# Patient Record
Sex: Male | Born: 1950 | Race: White | Hispanic: Yes | Smoking: Current every day smoker
Health system: Southern US, Community
[De-identification: ages and names within clinical notes are randomized; demographics above are authoritative.]

## PROBLEM LIST (undated history)

## (undated) DIAGNOSIS — E119 Type 2 diabetes mellitus without complications: Secondary | ICD-10-CM

## (undated) DIAGNOSIS — F209 Schizophrenia, unspecified: Secondary | ICD-10-CM

## (undated) DIAGNOSIS — Z720 Tobacco use: Secondary | ICD-10-CM

## (undated) DIAGNOSIS — M109 Gout, unspecified: Secondary | ICD-10-CM

## (undated) DIAGNOSIS — I10 Essential (primary) hypertension: Secondary | ICD-10-CM

## (undated) HISTORY — DX: Essential (primary) hypertension: I10

## (undated) HISTORY — DX: Gout, unspecified: M10.9

## (undated) HISTORY — PX: OTHER SURGICAL HISTORY: SHX169

---

## 1999-06-21 ENCOUNTER — Emergency Department (HOSPITAL_COMMUNITY): Admission: EM | Admit: 1999-06-21 | Discharge: 1999-06-21 | Payer: Self-pay | Admitting: Internal Medicine

## 1999-07-23 ENCOUNTER — Emergency Department (HOSPITAL_COMMUNITY): Admission: EM | Admit: 1999-07-23 | Discharge: 1999-07-23 | Payer: Self-pay | Admitting: Emergency Medicine

## 1999-10-01 ENCOUNTER — Emergency Department (HOSPITAL_COMMUNITY): Admission: EM | Admit: 1999-10-01 | Discharge: 1999-10-01 | Payer: Self-pay | Admitting: *Deleted

## 2000-09-30 ENCOUNTER — Emergency Department (HOSPITAL_COMMUNITY): Admission: EM | Admit: 2000-09-30 | Discharge: 2000-09-30 | Payer: Self-pay | Admitting: Emergency Medicine

## 2003-02-25 ENCOUNTER — Ambulatory Visit (HOSPITAL_COMMUNITY): Admission: RE | Admit: 2003-02-25 | Discharge: 2003-02-25 | Payer: Self-pay | Admitting: Internal Medicine

## 2003-02-25 ENCOUNTER — Encounter: Payer: Self-pay | Admitting: Internal Medicine

## 2003-10-03 ENCOUNTER — Encounter: Admission: RE | Admit: 2003-10-03 | Discharge: 2003-10-03 | Payer: Self-pay | Admitting: Internal Medicine

## 2004-12-28 ENCOUNTER — Ambulatory Visit (HOSPITAL_COMMUNITY): Admission: RE | Admit: 2004-12-28 | Discharge: 2004-12-28 | Payer: Self-pay | Admitting: Gastroenterology

## 2013-09-21 ENCOUNTER — Ambulatory Visit (INDEPENDENT_AMBULATORY_CARE_PROVIDER_SITE_OTHER): Payer: Medicare Other | Admitting: Family Medicine

## 2013-09-21 VITALS — BP 132/88 | HR 86 | Temp 98.0°F | Resp 16 | Ht 65.0 in | Wt 134.0 lb

## 2013-09-21 DIAGNOSIS — M1A00X1 Idiopathic chronic gout, unspecified site, with tophus (tophi): Secondary | ICD-10-CM

## 2013-09-21 DIAGNOSIS — M1A9XX1 Chronic gout, unspecified, with tophus (tophi): Secondary | ICD-10-CM

## 2013-09-21 MED ORDER — PREDNISONE 20 MG PO TABS: ORAL_TABLET | ORAL | Status: DC

## 2013-09-21 NOTE — Progress Notes (Signed)
  Subjective:  This chart was scribed for Elvina Sidle, MD by Arlan Organ, ED Scribe. This patient was seen in room Room 10 and the patient's care was started 1:32 PM.    Patient ID: [name] Wise, male    DOB: 08/24/1951, 62 y.o.   MRN: [id number]  HPI HPI Comments: [name] Wise is a 62 y.o. Male with a hx of gout who presents to [facility] complaining of boils on his fingers oh his left hand that started a few weeks ago . Pt also reports associated swelling in his left hand. Pt denies any pain in his fingers. Pt currently takes a prescribed medication for his gout, and he states he is taking the medication every day. Pt is originally from [place], but has been here in the [place] for 30 years now.   Review of Systems  Skin: Positive for wound (boils).      Objective:   Physical Exam  Nursing note and vitals reviewed. Constitutional: He is oriented to person, place, and time. He appears well-developed and well-nourished.  HENT:  Head: Normocephalic and atraumatic.  Eyes: EOM are normal.  Neck: Normal range of motion.  Cardiovascular: Normal rate.   Pulmonary/Chest: Effort normal.  Musculoskeletal: Normal range of motion.  Neurological: He is alert and oriented to person, place, and time.  Skin: Skin is warm and dry.  Concretions in distal thumb and index finger  Psychiatric: He has a normal mood and affect. His behavior is normal.      Assessment & Plan:  Tophaceous gout - Plan: predniSONE (DELTASONE) 20 MG tablet, Ambulatory referral to Orthopedic Surgery, DISCONTINUED: predniSONE (DELTASONE) 20 MG tablet  Signed, Elvina Sidle, MD

## 2013-09-21 NOTE — Patient Instructions (Signed)
Gout  Gout is an inflammatory condition (arthritis) caused by a buildup of uric acid crystals in the joints. Uric acid is a chemical that is normally present in the blood. Under some circumstances, uric acid can form into crystals in your joints. This causes joint redness, soreness, and swelling (inflammation). Repeat attacks are common. Over time, uric acid crystals can form into masses (tophi) near a joint, causing disfigurement. Gout is treatable and often preventable.  CAUSES   The disease begins with elevated levels of uric acid in the blood. Uric acid is produced by your body when it breaks down a naturally found substance called purines. This also happens when you eat certain foods such as meats and fish. Causes of an elevated uric acid level include:   Being passed down from parent to child (heredity).   Diseases that cause increased uric acid production (obesity, psoriasis, some cancers).   Excessive alcohol use.   Diet, especially diets rich in meat and seafood.   Medicines, including certain cancer-fighting drugs (chemotherapy), diuretics, and aspirin.   Chronic kidney disease. The kidneys are no longer able to remove uric acid well.   Problems with metabolism.  Conditions strongly associated with gout include:   Obesity.   High blood pressure.   High cholesterol.   Diabetes.  Not everyone with elevated uric acid levels gets gout. It is not understood why some people get gout and others do not. Surgery, joint injury, and eating too much of certain foods are some of the factors that can lead to gout.  SYMPTOMS    An attack of gout comes on quickly. It causes intense pain with redness, swelling, and warmth in a joint.   Fever can occur.   Often, only one joint is involved. Certain joints are more commonly involved:   Base of the big toe.   Knee.   Ankle.   Wrist.   Finger.  Without treatment, an attack usually goes away in a few days to weeks. Between attacks, you usually will not have  symptoms, which is different from many other forms of arthritis.  DIAGNOSIS   Your caregiver will suspect gout based on your symptoms and exam. Removal of fluid from the joint (arthrocentesis) is done to check for uric acid crystals. Your caregiver will give you a medicine that numbs the area (local anesthetic) and use a needle to remove joint fluid for exam. Gout is confirmed when uric acid crystals are seen in joint fluid, using a special microscope. Sometimes, blood, urine, and X-ray tests are also used.  TREATMENT   There are 2 phases to gout treatment: treating the sudden onset (acute) attack and preventing attacks (prophylaxis).  Treatment of an Acute Attack   Medicines are used. These include anti-inflammatory medicines or steroid medicines.   An injection of steroid medicine into the affected joint is sometimes necessary.   The painful joint is rested. Movement can worsen the arthritis.   You may use warm or cold treatments on painful joints, depending which works best for you.   Discuss the use of coffee, vitamin C, or cherries with your caregiver. These may be helpful treatment options.  Treatment to Prevent Attacks  After the acute attack subsides, your caregiver may advise prophylactic medicine. These medicines either help your kidneys eliminate uric acid from your body or decrease your uric acid production. You may need to stay on these medicines for a very long time.  The early phase of treatment with prophylactic medicine can be associated   with an increase in acute gout attacks. For this reason, during the first few months of treatment, your caregiver may also advise you to take medicines usually used for acute gout treatment. Be sure you understand your caregiver's directions.  You should also discuss dietary treatment with your caregiver. Certain foods such as meats and fish can increase uric acid levels. Other foods such as dairy can decrease levels. Your caregiver can give you a list of foods  to avoid.  HOME CARE INSTRUCTIONS    Do not take aspirin to relieve pain. This raises uric acid levels.   Only take over-the-counter or prescription medicines for pain, discomfort, or fever as directed by your caregiver.   Rest the joint as much as possible. When in bed, keep sheets and blankets off painful areas.   Keep the affected joint raised (elevated).   Use crutches if the painful joint is in your leg.   Drink enough water and fluids to keep your urine clear or pale yellow. This helps your body get rid of uric acid. Do not drink alcoholic beverages. They slow the passage of uric acid.   Follow your caregiver's dietary instructions. Pay careful attention to the amount of protein you eat. Your daily diet should emphasize fruits, vegetables, whole grains, and fat-free or low-fat milk products.   Maintain a healthy body weight.  SEEK MEDICAL CARE IF:    You have an oral temperature above 102 F (38.9 C).   You develop diarrhea, vomiting, or any side effects from medicines.   You do not feel better in 24 hours, or you are getting worse.  SEEK IMMEDIATE MEDICAL CARE IF:    Your joint becomes suddenly more tender and you have:   Chills.   An oral temperature above 102 F (38.9 C), not controlled by medicine.  MAKE SURE YOU:    Understand these instructions.   Will watch your condition.   Will get help right away if you are not doing well or get worse.  Document Released: 11/18/2000 Document Revised: 02/13/2012 Document Reviewed: 03/01/2010  ExitCare Patient Information 2014 [employer].

## 2013-11-26 ENCOUNTER — Ambulatory Visit (INDEPENDENT_AMBULATORY_CARE_PROVIDER_SITE_OTHER): Payer: Medicare Other | Admitting: Emergency Medicine

## 2013-11-26 VITALS — BP 122/74 | HR 96 | Temp 98.9°F | Resp 16 | Ht 65.0 in | Wt 134.0 lb

## 2013-11-26 DIAGNOSIS — F17200 Nicotine dependence, unspecified, uncomplicated: Secondary | ICD-10-CM

## 2013-11-26 DIAGNOSIS — K130 Diseases of lips: Secondary | ICD-10-CM

## 2013-11-26 DIAGNOSIS — M1A00X1 Idiopathic chronic gout, unspecified site, with tophus (tophi): Secondary | ICD-10-CM

## 2013-11-26 DIAGNOSIS — M1A9XX1 Chronic gout, unspecified, with tophus (tophi): Secondary | ICD-10-CM

## 2013-11-26 MED ORDER — COLCHICINE 0.6 MG PO TABS: ORAL_TABLET | ORAL | Status: DC

## 2013-11-26 MED ORDER — INDOMETHACIN 25 MG PO CAPS: 25.0000 mg | ORAL_CAPSULE | Freq: Three times a day (TID) | ORAL | Status: DC

## 2013-11-26 NOTE — Progress Notes (Signed)
[facility] [address], [place] [address] [phone]  Date:  11/26/2013   Name:  [name] Wise   DOB:  1951/08/16   MRN:  [id number]  PCP:  No primary provider on file.    Chief Complaint: Hand Pain and Bump on Upper Lip   History of Present Illness:  Brycin Kille is a 62 y.o. very pleasant male patient who presents with the following:  History of tophaceous gout and has three day history of pain in right third MCP with no history of injury.  Taking benemid.  Also developed a lesion on his mucosal upper lip in midline.  Smokes 1 PPD.  Denies other complaint or health concern today.   There are no active problems to display for this patient.   Past Medical History  Diagnosis Date  . Gout   . Hypertension     History reviewed. No pertinent past surgical history.  History  Substance Use Topics  . Smoking status: Current Every Day Smoker    Types: Cigarettes  . Smokeless tobacco: Not on file  . Alcohol Use: No    History reviewed. No pertinent family history.  No Known Allergies  Medication list has been reviewed and updated.  Current Outpatient Prescriptions on File Prior to Visit  Medication Sig Dispense Refill  . amLODipine (NORVASC) 10 MG tablet Take 10 mg by mouth daily.      . predniSONE (DELTASONE) 20 MG tablet 2 daily with food  10 tablet  1  . probenecid (BENEMID) 500 MG tablet Take 500 mg by mouth 2 (two) times daily.      . simvastatin (ZOCOR) 40 MG tablet Take 40 mg by mouth every evening.       No current facility-administered medications on file prior to visit.    Review of Systems:  As per HPI, otherwise negative.    Physical Examination: Filed Vitals:   11/26/13 1342  BP: 122/74  Pulse: 96  Temp: 98.9 F (37.2 C)  Resp: 16   Filed Vitals:   11/26/13 1342  Height: 5\' 5"  (1.651 m)  Weight: 134 lb (60.782 kg)   Body mass index is 22.3 kg/(m^2). Ideal Body Weight: Weight in (lb) to have BMI = 25: 149.9   GEN:  WDWN, NAD, Non-toxic, Alert & Oriented x 3 HEENT: Atraumatic, Normocephalic. Oropharynx negative.  White firm lesion on upper lip.   Ears and Nose: No external deformity. EXTR: No clubbing/cyanosis/edema NEURO: Normal gait.  PSYCH: Normally interactive. Conversant. Not depressed or anxious appearing.  Calm demeanor.  Right hand:  Gouty arthritis right third MCP.  Some tenderness.  Limited range of motion.  No lymphangitis.   Assessment and Plan: Gout Colchicine Indocin Refer to ENT for lip lesion   Signed,  Phillips Odor, MD

## 2013-11-26 NOTE — Patient Instructions (Signed)

## 2015-01-13 ENCOUNTER — Emergency Department (HOSPITAL_COMMUNITY): Payer: Medicare Other

## 2015-01-13 ENCOUNTER — Encounter (HOSPITAL_COMMUNITY): Payer: Self-pay

## 2015-01-13 ENCOUNTER — Inpatient Hospital Stay (HOSPITAL_COMMUNITY)
Admission: EM | Admit: 2015-01-13 | Discharge: 2015-01-20 | DRG: 554 | Disposition: A | Discharge status: Skilled Nursing Facility | Payer: Medicare Other | Attending: Internal Medicine | Admitting: Internal Medicine

## 2015-01-13 DIAGNOSIS — D539 Nutritional anemia, unspecified: Secondary | ICD-10-CM

## 2015-01-13 DIAGNOSIS — Z658 Other specified problems related to psychosocial circumstances: Secondary | ICD-10-CM

## 2015-01-13 DIAGNOSIS — I248 Other forms of acute ischemic heart disease: Secondary | ICD-10-CM | POA: Diagnosis present

## 2015-01-13 DIAGNOSIS — M109 Gout, unspecified: Secondary | ICD-10-CM | POA: Diagnosis present

## 2015-01-13 DIAGNOSIS — R778 Other specified abnormalities of plasma proteins: Secondary | ICD-10-CM

## 2015-01-13 DIAGNOSIS — E871 Hypo-osmolality and hyponatremia: Secondary | ICD-10-CM | POA: Diagnosis present

## 2015-01-13 DIAGNOSIS — M79606 Pain in leg, unspecified: Secondary | ICD-10-CM | POA: Diagnosis present

## 2015-01-13 DIAGNOSIS — Z7952 Long term (current) use of systemic steroids: Secondary | ICD-10-CM

## 2015-01-13 DIAGNOSIS — M1A9XX1 Chronic gout, unspecified, with tophus (tophi): Secondary | ICD-10-CM

## 2015-01-13 DIAGNOSIS — Z79899 Other long term (current) drug therapy: Secondary | ICD-10-CM

## 2015-01-13 DIAGNOSIS — E875 Hyperkalemia: Secondary | ICD-10-CM | POA: Diagnosis not present

## 2015-01-13 DIAGNOSIS — F17210 Nicotine dependence, cigarettes, uncomplicated: Secondary | ICD-10-CM | POA: Diagnosis present

## 2015-01-13 DIAGNOSIS — N184 Chronic kidney disease, stage 4 (severe): Secondary | ICD-10-CM | POA: Diagnosis present

## 2015-01-13 DIAGNOSIS — F200 Paranoid schizophrenia: Secondary | ICD-10-CM

## 2015-01-13 DIAGNOSIS — M10061 Idiopathic gout, right knee: Secondary | ICD-10-CM | POA: Diagnosis present

## 2015-01-13 DIAGNOSIS — Z23 Encounter for immunization: Secondary | ICD-10-CM

## 2015-01-13 DIAGNOSIS — I10 Essential (primary) hypertension: Secondary | ICD-10-CM | POA: Diagnosis not present

## 2015-01-13 DIAGNOSIS — R509 Fever, unspecified: Secondary | ICD-10-CM

## 2015-01-13 DIAGNOSIS — N179 Acute kidney failure, unspecified: Secondary | ICD-10-CM | POA: Diagnosis present

## 2015-01-13 DIAGNOSIS — E538 Deficiency of other specified B group vitamins: Secondary | ICD-10-CM | POA: Diagnosis present

## 2015-01-13 DIAGNOSIS — F209 Schizophrenia, unspecified: Secondary | ICD-10-CM | POA: Diagnosis present

## 2015-01-13 DIAGNOSIS — M1000 Idiopathic gout, unspecified site: Secondary | ICD-10-CM

## 2015-01-13 DIAGNOSIS — Z8249 Family history of ischemic heart disease and other diseases of the circulatory system: Secondary | ICD-10-CM

## 2015-01-13 DIAGNOSIS — D72829 Elevated white blood cell count, unspecified: Secondary | ICD-10-CM | POA: Diagnosis present

## 2015-01-13 DIAGNOSIS — E860 Dehydration: Secondary | ICD-10-CM | POA: Diagnosis present

## 2015-01-13 DIAGNOSIS — I129 Hypertensive chronic kidney disease with stage 1 through stage 4 chronic kidney disease, or unspecified chronic kidney disease: Secondary | ICD-10-CM | POA: Diagnosis present

## 2015-01-13 DIAGNOSIS — R000 Tachycardia, unspecified: Secondary | ICD-10-CM | POA: Diagnosis present

## 2015-01-13 DIAGNOSIS — Z720 Tobacco use: Secondary | ICD-10-CM | POA: Diagnosis not present

## 2015-01-13 DIAGNOSIS — R7989 Other specified abnormal findings of blood chemistry: Secondary | ICD-10-CM | POA: Diagnosis not present

## 2015-01-13 DIAGNOSIS — R32 Unspecified urinary incontinence: Secondary | ICD-10-CM | POA: Diagnosis present

## 2015-01-13 DIAGNOSIS — D509 Iron deficiency anemia, unspecified: Secondary | ICD-10-CM | POA: Insufficient documentation

## 2015-01-13 DIAGNOSIS — R52 Pain, unspecified: Secondary | ICD-10-CM

## 2015-01-13 DIAGNOSIS — I471 Supraventricular tachycardia: Secondary | ICD-10-CM

## 2015-01-13 DIAGNOSIS — N19 Unspecified kidney failure: Secondary | ICD-10-CM

## 2015-01-13 DIAGNOSIS — M10062 Idiopathic gout, left knee: Secondary | ICD-10-CM | POA: Diagnosis present

## 2015-01-13 HISTORY — DX: Tobacco use: Z72.0

## 2015-01-13 HISTORY — DX: Schizophrenia, unspecified: F20.9

## 2015-01-13 LAB — TROPONIN I
TROPONIN I: 0.77 ng/mL — AB (ref <0.031)
Troponin I: 0.55 ng/mL (ref <0.031)

## 2015-01-13 LAB — COMPREHENSIVE METABOLIC PANEL
ALT: 14 U/L (ref 0–53)
AST: 16 U/L (ref 0–37)
Albumin: 2.8 g/dL — ABNORMAL LOW (ref 3.5–5.2)
Alkaline Phosphatase: 94 U/L (ref 39–117)
Anion gap: 12 (ref 5–15)
BUN: 55 mg/dL — ABNORMAL HIGH (ref 6–23)
CO2: 19 mmol/L (ref 19–32)
Calcium: 9 mg/dL (ref 8.4–10.5)
Chloride: 102 mmol/L (ref 96–112)
Creatinine, Ser: 3.34 mg/dL — ABNORMAL HIGH (ref 0.50–1.35)
GFR calc Af Amer: 21 mL/min — ABNORMAL LOW (ref >90)
GFR calc non Af Amer: 18 mL/min — ABNORMAL LOW (ref >90)
Glucose, Bld: 191 mg/dL — ABNORMAL HIGH (ref 70–99)
Potassium: 4.8 mmol/L (ref 3.5–5.1)
Sodium: 133 mmol/L — ABNORMAL LOW (ref 135–145)
Total Bilirubin: 0.5 mg/dL (ref 0.3–1.2)
Total Protein: 6.9 g/dL (ref 6.0–8.3)

## 2015-01-13 LAB — I-STAT CG4 LACTIC ACID, ED
LACTIC ACID, VENOUS: 1.05 mmol/L (ref 0.5–2.0)
Lactic Acid, Venous: 1.77 mmol/L (ref 0.5–2.0)

## 2015-01-13 LAB — CBC WITH DIFFERENTIAL/PLATELET
Basophils Absolute: 0 10*3/uL (ref 0.0–0.1)
Basophils Relative: 0 % (ref 0–1)
Eosinophils Absolute: 0 10*3/uL (ref 0.0–0.7)
Eosinophils Relative: 0 % (ref 0–5)
HCT: 29.8 % — ABNORMAL LOW (ref 39.0–52.0)
Hemoglobin: 9.5 g/dL — ABNORMAL LOW (ref 13.0–17.0)
Lymphocytes Relative: 2 % — ABNORMAL LOW (ref 12–46)
Lymphs Abs: 0.2 10*3/uL — ABNORMAL LOW (ref 0.7–4.0)
MCH: 32.3 pg (ref 26.0–34.0)
MCHC: 31.9 g/dL (ref 30.0–36.0)
MCV: 101.4 fL — ABNORMAL HIGH (ref 78.0–100.0)
Monocytes Absolute: 0.7 10*3/uL (ref 0.1–1.0)
Monocytes Relative: 5 % (ref 3–12)
Neutro Abs: 12.7 10*3/uL — ABNORMAL HIGH (ref 1.7–7.7)
Neutrophils Relative %: 93 % — ABNORMAL HIGH (ref 43–77)
Platelets: 274 10*3/uL (ref 150–400)
RBC: 2.94 MIL/uL — ABNORMAL LOW (ref 4.22–5.81)
RDW: 15.1 % (ref 11.5–15.5)
WBC: 13.6 10*3/uL — ABNORMAL HIGH (ref 4.0–10.5)

## 2015-01-13 LAB — CBC
HCT: 27 % — ABNORMAL LOW (ref 39.0–52.0)
HEMOGLOBIN: 8.6 g/dL — AB (ref 13.0–17.0)
MCH: 32 pg (ref 26.0–34.0)
MCHC: 31.9 g/dL (ref 30.0–36.0)
MCV: 100.4 fL — ABNORMAL HIGH (ref 78.0–100.0)
Platelets: 258 10*3/uL (ref 150–400)
RBC: 2.69 MIL/uL — AB (ref 4.22–5.81)
RDW: 15 % (ref 11.5–15.5)
WBC: 12.4 10*3/uL — AB (ref 4.0–10.5)

## 2015-01-13 LAB — URINALYSIS, ROUTINE W REFLEX MICROSCOPIC
Bilirubin Urine: NEGATIVE
Glucose, UA: NEGATIVE mg/dL
Ketones, ur: NEGATIVE mg/dL
Leukocytes, UA: NEGATIVE
Nitrite: NEGATIVE
Protein, ur: 100 mg/dL — AB
Specific Gravity, Urine: 1.013 (ref 1.005–1.030)
Urobilinogen, UA: 0.2 mg/dL (ref 0.0–1.0)
pH: 6 (ref 5.0–8.0)

## 2015-01-13 LAB — CREATININE, SERUM
CREATININE: 2.96 mg/dL — AB (ref 0.50–1.35)
GFR calc Af Amer: 25 mL/min — ABNORMAL LOW (ref >90)
GFR, EST NON AFRICAN AMERICAN: 21 mL/min — AB (ref >90)

## 2015-01-13 LAB — URINE MICROSCOPIC-ADD ON

## 2015-01-13 LAB — OSMOLALITY: Osmolality: 303 mOsm/kg — ABNORMAL HIGH (ref 275–300)

## 2015-01-13 MED ORDER — METHYLPREDNISOLONE SODIUM SUCC 40 MG IJ SOLR
40.0000 mg | Freq: Two times a day (BID) | INTRAMUSCULAR | Status: DC
  Administered 2015-01-13 – 2015-01-16 (×6): 40 mg via INTRAVENOUS
  Filled 2015-01-13 (×6): qty 1

## 2015-01-13 MED ORDER — HALOPERIDOL DECANOATE 100 MG/ML IM SOLN: 100.0000 mg | INTRAMUSCULAR | Status: DC

## 2015-01-13 MED ORDER — METOPROLOL TARTRATE 25 MG PO TABS
25.0000 mg | ORAL_TABLET | Freq: Two times a day (BID) | ORAL | Status: DC
  Administered 2015-01-13 – 2015-01-20 (×14): 25 mg via ORAL
  Filled 2015-01-13 (×14): qty 1

## 2015-01-13 MED ORDER — SODIUM CHLORIDE 0.9 % IV SOLN
INTRAVENOUS | Status: AC
  Administered 2015-01-13 (×2): via INTRAVENOUS
  Administered 2015-01-14: 75 mL/h via INTRAVENOUS

## 2015-01-13 MED ORDER — OXYCODONE-ACETAMINOPHEN 5-325 MG PO TABS
1.0000 | ORAL_TABLET | ORAL | Status: DC | PRN
  Administered 2015-01-14 – 2015-01-16 (×9): 1 via ORAL
  Filled 2015-01-13 (×10): qty 1

## 2015-01-13 MED ORDER — COLCHICINE 0.6 MG PO TABS
0.6000 mg | ORAL_TABLET | Freq: Two times a day (BID) | ORAL | Status: DC
  Administered 2015-01-13 – 2015-01-16 (×6): 0.6 mg via ORAL
  Filled 2015-01-13 (×6): qty 1

## 2015-01-13 MED ORDER — BENZTROPINE MESYLATE 0.5 MG PO TABS
1.0000 mg | ORAL_TABLET | Freq: Every day | ORAL | Status: DC
  Administered 2015-01-13 – 2015-01-20 (×8): 1 mg via ORAL
  Filled 2015-01-13 (×8): qty 2

## 2015-01-13 MED ORDER — ACETAMINOPHEN 325 MG PO TABS
650.0000 mg | ORAL_TABLET | Freq: Once | ORAL | Status: AC
  Administered 2015-01-13: 650 mg via ORAL
  Filled 2015-01-13: qty 2

## 2015-01-13 MED ORDER — SODIUM CHLORIDE 0.9 % IJ SOLN
3.0000 mL | Freq: Two times a day (BID) | INTRAMUSCULAR | Status: DC
Start: 2015-01-13 — End: 2015-01-20
  Administered 2015-01-15 – 2015-01-19 (×3): 3 mL via INTRAVENOUS

## 2015-01-13 MED ORDER — MORPHINE SULFATE 4 MG/ML IJ SOLN
4.0000 mg | Freq: Once | INTRAMUSCULAR | Status: AC
  Administered 2015-01-13: 4 mg via INTRAVENOUS
  Filled 2015-01-13: qty 1

## 2015-01-13 MED ORDER — ACETAMINOPHEN 325 MG PO TABS
650.0000 mg | ORAL_TABLET | Freq: Four times a day (QID) | ORAL | Status: DC | PRN
  Administered 2015-01-13 – 2015-01-19 (×4): 650 mg via ORAL
  Filled 2015-01-13 (×5): qty 2

## 2015-01-13 MED ORDER — ONDANSETRON HCL 4 MG PO TABS: 4.0000 mg | ORAL_TABLET | Freq: Four times a day (QID) | ORAL | Status: DC | PRN

## 2015-01-13 MED ORDER — HEPARIN SODIUM (PORCINE) 5000 UNIT/ML IJ SOLN
5000.0000 [IU] | Freq: Three times a day (TID) | INTRAMUSCULAR | Status: DC
  Administered 2015-01-13 – 2015-01-20 (×20): 5000 [IU] via SUBCUTANEOUS
  Filled 2015-01-13 (×20): qty 1

## 2015-01-13 MED ORDER — PREDNISONE 20 MG PO TABS
60.0000 mg | ORAL_TABLET | Freq: Once | ORAL | Status: AC
  Administered 2015-01-13: 60 mg via ORAL
  Filled 2015-01-13: qty 3

## 2015-01-13 MED ORDER — ACETAMINOPHEN 650 MG RE SUPP
650.0000 mg | Freq: Four times a day (QID) | RECTAL | Status: DC | PRN
  Administered 2015-01-18: 650 mg via RECTAL
  Filled 2015-01-13: qty 1

## 2015-01-13 MED ORDER — POLYETHYLENE GLYCOL 3350 17 G PO PACK
17.0000 g | PACK | Freq: Every day | ORAL | Status: AC
  Administered 2015-01-13 – 2015-01-15 (×3): 17 g via ORAL
  Filled 2015-01-13 (×3): qty 1

## 2015-01-13 MED ORDER — ONDANSETRON HCL 4 MG/2ML IJ SOLN: 4.0000 mg | Freq: Four times a day (QID) | INTRAMUSCULAR | Status: DC | PRN

## 2015-01-13 MED ORDER — PNEUMOCOCCAL VAC POLYVALENT 25 MCG/0.5ML IJ INJ
0.5000 mL | INJECTION | INTRAMUSCULAR | Status: AC
  Administered 2015-01-15: 0.5 mL via INTRAMUSCULAR
  Filled 2015-01-13 (×2): qty 0.5

## 2015-01-13 MED ORDER — SODIUM CHLORIDE 0.9 % IV BOLUS (SEPSIS): 500.0000 mL | Freq: Once | INTRAVENOUS | Status: DC

## 2015-01-13 MED ORDER — SODIUM CHLORIDE 0.9 % IV BOLUS (SEPSIS)
1000.0000 mL | Freq: Once | INTRAVENOUS | Status: AC
  Administered 2015-01-13: 1000 mL via INTRAVENOUS

## 2015-01-13 MED ORDER — OXYCODONE-ACETAMINOPHEN 5-325 MG PO TABS
2.0000 | ORAL_TABLET | Freq: Once | ORAL | Status: AC
  Administered 2015-01-13: 2 via ORAL
  Filled 2015-01-13: qty 2

## 2015-01-13 NOTE — ED Notes (Signed)
Per [facility]- Pt speaks [place] with limited English. Pt c/o of knee pain x 3 months. Gout. Limited mobility. Pt has been using a can beside bed to urinate in or he is incontinent. Living arrangements are below standards. Lives with another person. Seen by Doctor last week for present complaint and was given medication for Gout. Pt c/o of knee pain and foot pain. Feet appear swollen and warm to touch and pain upon palpation. Rt knee appear more swollen than left. HR 125-131. Denies any other complaints. Denies pain upon urination.

## 2015-01-13 NOTE — ED Notes (Signed)
Patient transported to X-ray 

## 2015-01-13 NOTE — ED Notes (Signed)
DELAY IN TRANSPORT

## 2015-01-13 NOTE — ED Notes (Signed)
Bed: WA09 Expected date:  Expected time:  Means of arrival:  Comments: EMS/bilat leg pain

## 2015-01-13 NOTE — ED Notes (Signed)
MD at bedside. ADMITTING MD PRESENT TO EVALUATE THIS PT 

## 2015-01-13 NOTE — Progress Notes (Signed)
Patient speaks a little [place] but speaks [place]

## 2015-01-13 NOTE — Progress Notes (Signed)
Critical lab of troponin 0.55. MD Robb Matarrtiz text-paged of value.

## 2015-01-13 NOTE — H&P (Addendum)
[facility] History and Physical  Lauri Till ZOX:[id number] DOB: 08/27/1951 DOA: 01/13/2015  Referring physician: Dr. Seth Bake PCP: Ralene Ok, MD   Chief Complaint: lower ext pain  HPI: [name] Wise is a 64 y.o. male with past medical history of gout and essential hypertension that is brought to the emergency room by EMS. Per EMS reports the living conditions were in horrible. He relates I lateral lower extremity pain worst on the right great toe and ankle but also at the knee on the right and left knee and ankle chest progressively gotten worse over the last month. He relates he has been taking his indomethacin without any improvement, he is barely able to ambulate and he reaches crutches to move around, he also relates some urinary incontinence. Pt's denies any chest, sob or palpitation.  In the ED: Basic metabolic panel was done that shows acute renal failure, with a white count of 13 and left shift chest x-ray did not show any acute cortical pulmonary abnormalities, EKG shows sinus tachycardia with LVH so we are consulted for further evaluation.   Review of Systems:  Constitutional:  No weight loss, night sweats, Fevers, chills, fatigue.  HEENT:  No headaches, Difficulty swallowing,Tooth/dental problems,Sore throat,  No sneezing, itching, ear ache, nasal congestion, post nasal drip,  Cardio-vascular:  No chest pain, Orthopnea, PND, swelling in lower extremities, anasarca, dizziness, palpitations  GI:  No heartburn, indigestion, abdominal pain, nausea, vomiting, diarrhea, change in bowel habits, loss of appetite  Resp:  No shortness of breath with exertion or at rest. No excess mucus, no productive cough, No non-productive cough, No coughing up of blood.No change in color of mucus.No wheezing.No chest wall deformity  Skin:  no rash or lesions.  GU:  no dysuria, change in color of urine, no urgency or frequency. No flank pain.  Musculoskeletal:  No joint pain or swelling. No  decreased range of motion. No back pain.  Psych:  No change in mood or affect. No depression or anxiety. No memory loss.   Past Medical History  Diagnosis Date  . Gout   . Hypertension   . Schizophrenia    History reviewed. No pertinent past surgical history. Social History:  reports that he has been smoking Cigarettes.  He does not have any smokeless tobacco history on file. He reports that he does not drink alcohol or use illicit drugs.  No Known Allergies  Family History  Problem Relation Age of Onset  . Hypertension Mother   . Gout Father     Prior to Admission medications   Medication Sig Start Date End Date Taking? Authorizing Provider  amLODipine (NORVASC) 10 MG tablet Take 10 mg by mouth daily.   Yes Historical Provider, MD  haloperidol decanoate (HALDOL DECANOATE) 100 MG/ML injection Inject 1 mL into the muscle as directed.  12/15/14  Yes Historical Provider, MD  predniSONE (DELTASONE) 5 MG tablet Take 1 tablet by mouth 2 (two) times daily. 01/06/15  Yes Historical Provider, MD  probenecid (BENEMID) 500 MG tablet Take 500 mg by mouth 2 (two) times daily.   Yes Historical Provider, MD  benztropine (COGENTIN) 1 MG tablet Take 1 tablet by mouth daily. 12/15/14   Historical Provider, MD  colchicine 0.6 MG tablet 2 now and one in one hour.  Tomorrow 1 po bid Patient not taking: Reported on 01/13/2015 11/26/13   Carmelina Dane, MD  indomethacin (INDOCIN) 25 MG capsule Take 1 capsule (25 mg total) by mouth 3 (three) times daily with meals. Patient not  taking: Reported on 01/13/2015 11/26/13   Carmelina Dane, MD  predniSONE (DELTASONE) 20 MG tablet 2 daily with food Patient not taking: Reported on 01/13/2015 09/21/13   Elvina Sidle, MD   Physical Exam: Filed Vitals:   01/13/15 1100 01/13/15 1159 01/13/15 1227 01/13/15 1230  BP: 154/89  152/92 157/89  Pulse: 117  116   Temp:  99.2 F (37.3 C) 99.2 F (37.3 C)   TempSrc:  Oral Oral   Resp: SpO2: 99%  100%      Wt Readings from Last 3 Encounters:  11/26/13 60.782 kg (134 lb)  09/21/13 60.782 kg (134 lb)    General:  Appears calm and comfortable, moon like face Eyes: PERRL, normal lids, irises & conjunctiva ENT: grossly normal hearing, lips & tongue Neck: no LAD, masses or thyromegaly Cardiovascular: RRR, no rub. No LE edema. Telemetry: Sinus tach Respiratory: CTA bilaterally, no w/r/r. Normal respiratory effort. Abdomen: soft, ntnd Skin: Bilateral lower extremity swelling more than the upper extremities, Musculoskeletal: Warm knees bilateral exquisitely tender right big toe. Psychiatric: grossly normal mood and affect, speech fluent and appropriate Neurologic: grossly non-focal.          Labs on Admission:  Basic Metabolic Panel:  Recent Labs Lab 01/13/15 0920  NA 133*  K 4.8  CL 102  CO2 19  GLUCOSE 191*  BUN 55*  CREATININE 3.34*  CALCIUM 9.0   Liver Function Tests:  Recent Labs Lab 01/13/15 0920  AST 16  ALT 14  ALKPHOS 94  BILITOT 0.5  PROT 6.9  ALBUMIN 2.8*   No results for input(s): LIPASE, AMYLASE in the last 168 hours. No results for input(s): AMMONIA in the last 168 hours. CBC:  Recent Labs Lab 01/13/15 0920  WBC 13.6*  NEUTROABS 12.7*  HGB 9.5*  HCT 29.8*  MCV 101.4*  PLT 274   Cardiac Enzymes: No results for input(s): CKTOTAL, CKMB, CKMBINDEX, TROPONINI in the last 168 hours.  BNP (last 3 results) No results for input(s): BNP in the last 8760 hours.  ProBNP (last 3 results) No results for input(s): PROBNP in the last 8760 hours.  CBG: No results for input(s): GLUCAP in the last 168 hours.  Radiological Exams on Admission: Dg Chest 2 View  01/13/2015   CLINICAL DATA:  Bilateral lower extremity pain  EXAM: CHEST  2 VIEW  COMPARISON:  None.  FINDINGS: Lungs are clear. Heart is upper normal in size with pulmonary vascularity within normal limits. No adenopathy. No bone lesions.  IMPRESSION: No edema or consolidation.   Electronically  Signed   By: Bretta Bang III M.D.   On: 01/13/2015 09:57    EKG: Independently reviewed. Sinus tachycardia with LVH and left atrial enlargement  Assessment/Plan Acute kidney injury; - He relates he has been taking Indocin for some time, which could be the main culprit of his acute renal failure. - I will go ahead and get a renal ultrasound, UA showed a low specific gravity and 100 of protein and no white blood cell count. - We'll get a 24-hour urine protein and creatinine, will send for SPEP and UPEP showed a TSH.  Acute Gout flare: - I'll go ahead and DC the indomethacin, start him on IV steroids. - Get a uric acid level hold probenecid.  Leukocytosis: - Unclear etiology has remained afebrile, this could be due to to the acute renal failure and or acute gout flare. - The patient also was on prednisone tapered which could explain  his leukocytosis  Hyponatremia: - Check urinary sodium and urinary creatinine, specific gravity was 10:15. - He relates no orthostasis he does have a new acute renal failure.  Macrocytic anemia: - Check an anemia panel for B-12 and RBC folate deficiency, also a call views can call status, he relates he doesn't consume large amounts of alcohol.  Sinus tachycardia: - Unclear etiology. - Check a 2-D echocardiogram, and cardiac enzymes.  History of schizophrenia: -  I'll have any records of this, the patient relates he takes Cogentin and Haldol for some kind of mental disorder. - I will go ahead and consult psychiatry.   Code Status: full DVT Prophylaxis:hepatin Family Communication: none Disposition Plan: inpatient  Time spent: 80 min  Marinda ElkFELIZ ORTIZ, Esty Ahuja [facility] Pager [phone]

## 2015-01-13 NOTE — ED Notes (Signed)
MD at bedside. Pfeiffer present to evaluate this pt

## 2015-01-13 NOTE — ED Notes (Signed)
MD at bedside. EDPA PRESENT AT BS EVALUATED PT

## 2015-01-13 NOTE — ED Provider Notes (Signed)
CSN: [id number]     Arrival date & time 01/13/15  0845 History   First MD Initiated Contact with Patient 01/13/15 [phone]     No chief complaint on file.    (Consider location/radiation/quality/duration/timing/severity/associated sxs/prior Treatment) HPI Comments: The patient is a 64 year old male with a known history of gout, and hypertension presenting to the EMS with a chief complaint of bilateral lower extremity pain and paresthesias for 3 months, worsening over the last one month.  Patient reports knee and bilateral foot pain, burning worsened with ambulation. He reports he is able to use crutches, however today he is unable to ambulate due to pain and weakness. Patient reports urinary incontinence for 2 weeks. Was evaluated by primary care doctor within the week for bilateral feet pain was diagnosed with gout flare.  Patient denies chest pain, shortness of breath, low back pain, abdominal pain. EMS reports poor living conditions and the patient states he has no food at home. [facility]  Patient is a 64 y.o. male presenting with knee pain, leg pain, and lower extremity pain. The history is provided by the patient. A language interpreter was used.  Knee Pain Associated symptoms: no fever   Leg Pain Associated symptoms: no fever   Foot Pain Associated symptoms include arthralgias, joint swelling and numbness. Pertinent negatives include no abdominal pain, chills, fever, nausea or vomiting.    Past Medical History  Diagnosis Date  . Gout   . Hypertension    No past surgical history on file. No family history on file. History  Substance Use Topics  . Smoking status: Current Every Day Smoker    Types: Cigarettes  . Smokeless tobacco: Not on file  . Alcohol Use: No    Review of Systems  Constitutional: Negative for fever and chills.  Gastrointestinal: Negative for nausea, vomiting, abdominal pain and diarrhea.  Genitourinary: Positive for difficulty urinating.   Musculoskeletal: Positive for joint swelling and arthralgias.  Neurological: Positive for numbness.      Allergies  Review of patient's allergies indicates no known allergies.  Home Medications   Prior to Admission medications   Medication Sig Start Date End Date Taking? Authorizing Provider  amLODipine (NORVASC) 10 MG tablet Take 10 mg by mouth daily.    Historical Provider, MD  colchicine 0.6 MG tablet 2 now and one in one hour.  Tomorrow 1 po bid 11/26/13   Carmelina Dane, MD  indomethacin (INDOCIN) 25 MG capsule Take 1 capsule (25 mg total) by mouth 3 (three) times daily with meals. 11/26/13   Carmelina Dane, MD  predniSONE (DELTASONE) 20 MG tablet 2 daily with food 09/21/13   Elvina Sidle, MD  probenecid (BENEMID) 500 MG tablet Take 500 mg by mouth 2 (two) times daily.    Historical Provider, MD  simvastatin (ZOCOR) 40 MG tablet Take 40 mg by mouth every evening.    Historical Provider, MD   There were no vitals taken for this visit. Physical Exam  Constitutional: He is oriented to person, place, and time. He appears well-developed. No distress.  HENT:  Head: Normocephalic and atraumatic.  Mouth/Throat: Mucous membranes are dry.  Neck: Neck supple.  Cardiovascular: Regular rhythm.  Tachycardia present.   Pulmonary/Chest: He has rales.  Abdominal: Soft. He exhibits no distension. There is no tenderness. There is no rebound and no guarding.  Musculoskeletal:  Moves all 4 extremities. Decrease in strength to lower extremities. Moves evenly. Pain with palpation over bilateral feet. Bilateral feet swollen, tender, mild  increase in warmth.  Neurological: He is alert and oriented to person, place, and time.  Skin: Skin is warm and dry. He is not diaphoretic.  Psychiatric: He has a normal mood and affect.  Nursing note and vitals reviewed.   ED Course  Procedures (including critical care time) Labs Review Labs Reviewed  CBC WITH DIFFERENTIAL/PLATELET - Abnormal;  Notable for the following:    WBC 13.6 (*)    RBC 2.94 (*)    Hemoglobin 9.5 (*)    HCT 29.8 (*)    MCV 101.4 (*)    Neutrophils Relative % 93 (*)    Neutro Abs 12.7 (*)    Lymphocytes Relative 2 (*)    Lymphs Abs 0.2 (*)    All other components within normal limits  COMPREHENSIVE METABOLIC PANEL - Abnormal; Notable for the following:    Sodium 133 (*)    Glucose, Bld 191 (*)    BUN 55 (*)    Creatinine, Ser 3.34 (*)    Albumin 2.8 (*)    GFR calc non Af Amer 18 (*)    GFR calc Af Amer 21 (*)    All other components within normal limits  CULTURE, BLOOD (ROUTINE X 2)  CULTURE, BLOOD (ROUTINE X 2)  URINALYSIS, ROUTINE W REFLEX MICROSCOPIC  I-STAT CG4 LACTIC ACID, ED    Imaging Review Dg Chest 2 View  01/13/2015   CLINICAL DATA:  Bilateral lower extremity pain  EXAM: CHEST  2 VIEW  COMPARISON:  None.  FINDINGS: Lungs are clear. Heart is upper normal in size with pulmonary vascularity within normal limits. No adenopathy. No bone lesions.  IMPRESSION: No edema or consolidation.   Electronically Signed   By: Bretta BangWilliam  Woodruff III M.D.   On: 01/13/2015 09:57     EKG Interpretation None      MDM   Final diagnoses:  Acute Kidney Injury Anemia urinary incontinence History of gout    Patient with bilateral lower extremity pain and paresthesias, normal sensation to bilateral feet, likely gout flare. Patient also complained of urinary incontinence denies low back pain abdominal pain. Pt tachycardic 124.  Rectal temp slightly elevated at 100.1.  Discussed with Dr. Donnald GarrePfeiffer see note for EKG results. CBC shows leukocytotic with left shift and anemia with Hbg 9.5. Gradual tachycardia and leukocytosis secondary to acute gout flare up bilateral feet. Lactic negative. CMP shows Acute kidney injury Cr 3.34, BUN 55, unsure baseline, rehydrating at this time.  Plan to admit for gout flair, anemia, Acute kidney injury, lower extremity weakenss. Awaiting UA. UA negative for infection, unclear  etiology of urinary incontinence, post void residual was 190, no signs of urinary retention. Dr. Donnald GarrePfeiffer also evaluate the patient in this encounter. Discussed patient history, condition with Dr. David StallFeliz-Ortiz, agrees to admit the patient.  Mellody DrownLauren Orvin Netter, PA-C 01/13/15 1443  Arby BarretteMarcy Pfeiffer, MD 01/14/15 [phone]

## 2015-01-14 ENCOUNTER — Encounter (HOSPITAL_COMMUNITY): Payer: Self-pay | Admitting: Cardiovascular Disease

## 2015-01-14 DIAGNOSIS — F209 Schizophrenia, unspecified: Secondary | ICD-10-CM

## 2015-01-14 DIAGNOSIS — Z720 Tobacco use: Secondary | ICD-10-CM | POA: Diagnosis present

## 2015-01-14 DIAGNOSIS — R7989 Other specified abnormal findings of blood chemistry: Secondary | ICD-10-CM

## 2015-01-14 DIAGNOSIS — I10 Essential (primary) hypertension: Secondary | ICD-10-CM | POA: Diagnosis present

## 2015-01-14 DIAGNOSIS — I248 Other forms of acute ischemic heart disease: Secondary | ICD-10-CM | POA: Diagnosis present

## 2015-01-14 DIAGNOSIS — R600 Localized edema: Secondary | ICD-10-CM

## 2015-01-14 LAB — BASIC METABOLIC PANEL
Anion gap: 9 (ref 5–15)
BUN: 47 mg/dL — ABNORMAL HIGH (ref 6–23)
CO2: 19 mmol/L (ref 19–32)
Calcium: 8.3 mg/dL — ABNORMAL LOW (ref 8.4–10.5)
Chloride: 108 mmol/L (ref 96–112)
Creatinine, Ser: 2.68 mg/dL — ABNORMAL HIGH (ref 0.50–1.35)
GFR calc Af Amer: 28 mL/min — ABNORMAL LOW (ref >90)
GFR calc non Af Amer: 24 mL/min — ABNORMAL LOW (ref >90)
GLUCOSE: 170 mg/dL — AB (ref 70–99)
POTASSIUM: 4.6 mmol/L (ref 3.5–5.1)
Sodium: 136 mmol/L (ref 135–145)

## 2015-01-14 LAB — URINE CULTURE
Colony Count: NO GROWTH
Culture: NO GROWTH

## 2015-01-14 LAB — OSMOLALITY, URINE: Osmolality, Ur: 278 mOsm/kg — ABNORMAL LOW (ref 390–1090)

## 2015-01-14 LAB — TROPONIN I: TROPONIN I: 1.04 ng/mL — AB (ref <0.031)

## 2015-01-14 LAB — CREATININE, URINE, RANDOM: Creatinine, Urine: 29.1 mg/dL

## 2015-01-14 LAB — SODIUM, URINE, RANDOM: Sodium, Ur: 74 mEq/L

## 2015-01-14 MED ORDER — ASPIRIN EC 325 MG PO TBEC
325.0000 mg | DELAYED_RELEASE_TABLET | Freq: Every day | ORAL | Status: DC
  Administered 2015-01-14 – 2015-01-20 (×7): 325 mg via ORAL
  Filled 2015-01-14 (×7): qty 1

## 2015-01-14 MED ORDER — SODIUM CHLORIDE 0.9 % IV SOLN: INTRAVENOUS | Status: AC

## 2015-01-14 NOTE — Progress Notes (Signed)
Patient troponin 0.77. Patient denies chest pain. Patient in no acute distress. NP on call made aware. No new orders placed. Will continue to monitor closely.

## 2015-01-14 NOTE — Consult Note (Signed)
[facility]   Reason for Consult:  Schizophrenia and medication managment Referring Physician:  Dr. Radonna RickerFeliz Patient Identification: [name] Wise Portman MRN:  [id number] Principal Diagnosis: <principal problem not specified> Diagnosis:   Patient Active Problem List   Diagnosis Date Noted  . Acute kidney injury [N17.9] 01/13/2015  . Gout flare [M10.9] 01/13/2015  . Leukocytosis [D72.829] 01/13/2015  . Hyponatremia [E87.1] 01/13/2015  . Macrocytic anemia [D53.9] 01/13/2015  . Sinus tachycardia [I47.1] 01/13/2015  . AKI (acute kidney injury) [N17.9] 01/13/2015    Total Time spent with patient: 30 minutes  Subjective:   [name] Wise [name] Wise is a 64 y.o. male patient admitted with acute renal failure and sinus tachy and has psychotrophic medication.Marland Kitchen.  HPI:  [name] Wise Prindle is a 64 y.o. male seen and chart reviewed for psych consultation for medication management of schizophrenia along with Unk LightningHolly Gerber, LCSW. Patient is receiving abdomen ultrasonogram at this time. Patient stated that he has been going to [facility] for his Haldol intramuscular shots for every six weeks and his last shot was about four weeks ago. Patient has denied current symptoms of auditory, visual hallucinations, delusions, paranoia and depression. Patient speaks okay english and understand questions and asks verification if he don't understand. Reportedly he can not walk because of gout but able to drive to grocery. He gave consent to talk to his daughter and friend for obtaining psychosocial history and LCSW will contact them soon. Patient states that some time he hear voices. [facility] as per the [facility]. He has no EPS.   Medical history: Patient with past medical history of gout and essential hypertension that is brought to the emergency room by EMS. Per EMS reports the living conditions were in horrible. He relates I lateral lower extremity pain worst on the right great toe and ankle but also at the  knee on the right and left knee and ankle chest progressively gotten worse over the last month. He relates he has been taking his indomethacin without any improvement, he is barely able to ambulate and he reaches crutches to move around, he also relates some urinary incontinence. Pt's denies any chest, sob or palpitation.  In the ED: Basic metabolic panel was done that shows acute renal failure, with a white count of 13 and left shift chest x-ray did not show any acute cortical pulmonary abnormalities, EKG shows sinus tachycardia with LVH so we are consulted for further evaluation.   Review of Systems:  Constitutional:  No weight loss, night sweats, Fevers, chills, fatigue.  HEENT:  No headaches, Difficulty swallowing,Tooth/dental problems,Sore throat,  No sneezing, itching, ear ache, nasal congestion, post nasal drip,  Cardio-vascular:  No chest pain, Orthopnea, PND, swelling in lower extremities, anasarca, dizziness, palpitations  GI:  No heartburn, indigestion, abdominal pain, nausea, vomiting, diarrhea, change in bowel habits, loss of appetite  Resp:  No shortness of breath with exertion or at rest. No excess mucus, no productive cough, No non-productive cough, No coughing up of blood.No change in color of mucus.No wheezing.No chest wall deformity  Skin:  no rash or lesions.  GU:  no dysuria, change in color of urine, no urgency or frequency. No flank pain.  Musculoskeletal:  No joint pain or swelling. No decreased range of motion. No back pain.  Psych:  No change in mood or affect. No depression or anxiety. No memory loss.   HPI Elements:   Location:  schizophrenia. Quality:  poor. Severity:  unable to care for himself. Timing:  swollen  knees. Duration:  few weeks. Context:  psychosocial stresses and medical acuty..  Past Medical History:  Past Medical History  Diagnosis Date  . Gout   . Hypertension   . Schizophrenia    History reviewed. No pertinent  past surgical history. Family History:  Family History  Problem Relation Age of Onset  . Hypertension Mother   . Gout Father    Social History:  History  Alcohol Use No     History  Drug Use No    History   Social History  . Marital Status: Divorced    Spouse Name: N/A  . Number of Children: N/A  . Years of Education: N/A   Social History Main Topics  . Smoking status: Current Every Day Smoker    Types: Cigarettes  . Smokeless tobacco: Not on file  . Alcohol Use: No  . Drug Use: No  . Sexual Activity: Yes   Other Topics Concern  . None   Social History Narrative   Additional Social History:     Allergies:  No Known Allergies  Vitals: Blood pressure 156/81, pulse 84, temperature 98.8 F (37.1 C), temperature source Oral, resp. rate 18, height  (1.676 m), weight 59.6 kg (131 lb 6.3 oz), SpO2 99 %.  Risk to Self: Is patient at risk for suicide?: No Risk to Others:   Prior Inpatient Therapy:   Prior Outpatient Therapy:    Current Facility-Administered Medications  Medication Dose Route Frequency Provider Last Rate Last Dose  . 0.9 %  sodium chloride infusion   Intravenous Continuous Marinda Elk, MD 75 mL/hr at 01/13/15 2000    . acetaminophen (TYLENOL) tablet 650 mg  650 mg Oral Q6H PRN Marinda Elk, MD   650 mg at 01/13/15 1830   Or  . acetaminophen (TYLENOL) suppository 650 mg  650 mg Rectal Q6H PRN Marinda Elk, MD      . benztropine (COGENTIN) tablet 1 mg  1 mg Oral Daily Marinda Elk, MD   1 mg at 01/13/15 1830  . colchicine tablet 0.6 mg  0.6 mg Oral BID Marinda Elk, MD   0.6 mg at 01/13/15 1830  . [START ON 01/26/2015] haloperidol decanoate (HALDOL DECANOATE) 100 MG/ML injection 100 mg  100 mg Intramuscular Q6 weeks Marinda Elk, MD      . heparin injection 5,000 Units  5,000 Units Subcutaneous 3 times per day Marinda Elk, MD   5,000 Units at 01/14/15 0528  . methylPREDNISolone sodium succinate  (SOLU-MEDROL) 40 mg/mL injection 40 mg  40 mg Intravenous Q12H Marinda Elk, MD   40 mg at 01/14/15 0528  . metoprolol tartrate (LOPRESSOR) tablet 25 mg  25 mg Oral BID Marinda Elk, MD   25 mg at 01/13/15 1830  . ondansetron (ZOFRAN) tablet 4 mg  4 mg Oral Q6H PRN Marinda Elk, MD       Or  . ondansetron [facility]) injection 4 mg  4 mg Intravenous Q6H PRN Marinda Elk, MD      . oxyCODONE-acetaminophen (PERCOCET/ROXICET) 5-325 MG per tablet 1 tablet  1 tablet Oral Q4H PRN Leanne Chang, NP   1 tablet at 01/14/15 5784  . pneumococcal 23 valent vaccine (PNU-IMMUNE) injection 0.5 mL  0.5 mL Intramuscular Tomorrow-1000 Arby Barrette, MD      . polyethylene glycol (MIRALAX / GLYCOLAX) packet 17 g  17 g Oral Daily Marinda Elk, MD   17 g at 01/13/15 1830  .  sodium chloride 0.9 % injection 3 mL  3 mL Intravenous Q12H Marinda Elk, MD   3 mL at 01/13/15 1515    Musculoskeletal: Strength & Muscle Tone: decreased Gait & Station: unable to stand Patient leans: N/A  Psychiatric Specialty Exam: Physical Exam as per history and physical  ROS weak, tired and anxious.  Blood pressure 156/81, pulse 84, temperature 98.8 F (37.1 C), temperature source Oral, resp. rate 18, height  (1.676 m), weight 59.6 kg (131 lb 6.3 oz), SpO2 99 %.Body mass index is 21.22 kg/(m^2).  General Appearance: Casual  Eye Contact::  Good  Speech:  Clear and Coherent  Volume:  Normal  Mood:  Euthymic  Affect:  Appropriate and Congruent  Thought Process:  Coherent and Goal Directed  Orientation:  Full (Time, Place, and Person)  Thought Content:  Hallucinations: Auditory  Suicidal Thoughts:  No  Homicidal Thoughts:  No  Memory:  Immediate;   Fair Recent;   Fair  Judgement:  Intact  Insight:  Fair  Psychomotor Activity:  Decreased  Concentration:  Good  Recall:  [employer] of Knowledge:Fair  Language: Fair, native language is combodian, speak little english.  Akathisia:   NA  Handed:  Right  AIMS (if indicated):     Assets:  Communication Skills Desire for Improvement Financial Resources/Insurance Housing Leisure Time Resilience Social Support  ADL's:  Impaired  Cognition: WNL  Sleep:      Medical Decision Making: Established Problem, Stable/Improving (1), New problem, with additional work up planned, Review of Psycho-Social Stressors (1), Review or order clinical lab tests (1), Review or order medicine tests (1), Review of Medication Regimen & Side Effects (2) and Review of New Medication or Change in Dosage (2)  Treatment Plan Summary: Daily contact with patient to assess and evaluate symptoms and progress in treatment and Medication management  Plan:  Continue current medication management without changes No evidence of imminent risk to self or others at present.   Patient does not meet criteria for psychiatric inpatient admission.   Disposition: Able to go back to home when medically stable and follow up with out patient psych treatment. Patient may need home health care services if can not function at his base line.   Chrislynn Mosely,JANARDHAHA R. 01/14/2015 9:55 AM

## 2015-01-14 NOTE — Clinical Documentation Improvement (Signed)
The impression of the of the H&P states pt has Schizophrenia requiring the Cogentin 1mg .  Can the Schizophrenia be further specified as    Possible Clinical Conditions?  paranoid   disorganized   catatonic   undifferentiated  Major depressive disorder with psychotic events   Major depressive disorder without psychotic events _______Other Condition__________________ _______Cannot Clinically Determine   Supporting Information: Renal Failure, Hyponatremia, Essential htn, Gout, anemia, tachycardia  Risk Factors: Signs & Symptoms: Diagnostics: Treatment  Thank You, Enis SlipperLolita J Takeria Marquina ,RN Clinical Documentation Specialist:  [phone]  [facility]- Health Information Management

## 2015-01-14 NOTE — Progress Notes (Signed)
PROGRESS NOTE  [name] CorrenteChay Riles ZOX:[id number] DOB: Jun 12, 1951 DOA: 01/13/2015 PCP: Ralene OkMOREIRA,ROY, MD  HPI: 64 y.o. male with past medical history of gout and essential hypertension that is brought to the emergency room by EMS. Per EMS reports the living conditions were in horrible. He relates I lateral lower extremity pain worst on the right great toe and ankle but also at the knee on the right and left knee and ankle chest progressively gotten worse over the last month. He relates he has been taking his indomethacin without any improvement, he is barely able to ambulate and he reaches crutches to move around, he also relates some urinary incontinence. Pt's denies any chest, sob or palpitation.  Subjective / 24 H Interval events Continues to complain of multiple joint pains  Assessment/Plan: Active Problems:   Acute kidney injury   Gout flare   Leukocytosis   Hyponatremia   Macrocytic anemia   Sinus tachycardia   AKI (acute kidney injury)   Acute kidney injury - this is probably related to his NSAID use, avoid further use of indomethacin, he is improving some with hydration   Acute Gout flare: - DC the indomethacin, start him on IV steroids.  Leukocytosis: - Unclear etiology has remained afebrile, this could be due to to the acute renal failure and or acute gout flare. - The patient also was on prednisone tapered which could explain his leukocytosis  Hyponatremia: - Likely due to dehydration, improving with IV fluids  Macrocytic anemia: - Check an anemia panel for B-12 and RBC folate deficiency, also a call views can call status, he relates he doesn't consume large amounts of alcohol.  Sinus tachycardia, troponin elevation - Unclear etiology. - 2-D echo without any wall motion abnormalities, cardiology consulted, appreciate input  History of schizophrenia, cannot clinically determine type -Psychiatry consulted, appreciate input   Diet:   Fluids: NS DVT Prophylaxis:  Heparin  Code Status: Full Code Family Communication: d/w patient  Disposition Plan: inpatient, PT to evaluate  Consultants:  Cardiology   Procedures:  None    Antibiotics  Anti-infectives    None       Studies  Dg Chest 2 View  01/13/2015   CLINICAL DATA:  Bilateral lower extremity pain  EXAM: CHEST  2 VIEW  COMPARISON:  None.  FINDINGS: Lungs are clear. Heart is upper normal in size with pulmonary vascularity within normal limits. No adenopathy. No bone lesions.  IMPRESSION: No edema or consolidation.   Electronically Signed   By: Bretta BangWilliam  Woodruff III M.D.   On: 01/13/2015 09:57    Objective  Filed Vitals:   01/13/15 1324 01/13/15 1452 01/13/15 2158 01/14/15 0604  BP: 163/91 152/89 129/77 156/81  Pulse: 126 115 79 84  Temp: 98.5 F (36.9 C) 99.3 F (37.4 C) 98.5 F (36.9 C) 98.8 F (37.1 C)  TempSrc: Oral Oral Oral Oral  Resp: 16 16 16 18   Height:  5\' 6"  (1.676 m)    Weight:  59.6 kg (131 lb 6.3 oz)    SpO2: 100% 99% 99% 99%    Intake/Output Summary (Last 24 hours) at 01/14/15 0950 Last data filed at 01/14/15 0700  Gross per 24 hour  Intake   2710 ml  Output   1225 ml  Net   1485 ml   Filed Weights   01/13/15 1452  Weight: 59.6 kg (131 lb 6.3 oz)    Exam:  General:  NAD  HEENT: no scleral icterus  Cardiovascular: RRR  Respiratory: CTA biL  Abdomen: soft, non tender  MSK/Extremities: no clubbing/cyanosis, mild bilateral knee swelling  Skin: no rashes  Neuro: non focal   Data Reviewed: Basic Metabolic Panel:  Recent Labs Lab 01/13/15 0920 01/13/15 1601  NA 133*  --   K 4.8  --   CL 102  --   CO2 19  --   GLUCOSE 191*  --   BUN 55*  --   CREATININE 3.34* 2.96*  CALCIUM 9.0  --    Liver Function Tests:  Recent Labs Lab 01/13/15 0920  AST 16  ALT 14  ALKPHOS 94  BILITOT 0.5  PROT 6.9  ALBUMIN 2.8*   CBC:  Recent Labs Lab 01/13/15 0920 01/13/15 1601  WBC 13.6* 12.4*  NEUTROABS 12.7*  --   HGB 9.5* 8.6*  HCT  29.8* 27.0*  MCV 101.4* 100.4*  PLT 274 258   Cardiac Enzymes:  Recent Labs Lab 01/13/15 1601 01/13/15 2054 01/14/15 0241  TROPONINI 0.55* 0.77* 1.04*    Recent Results (from the past 240 hour(s))  Culture, blood (routine x 2)     Status: None (Preliminary result)   Collection Time: 01/13/15  9:23 AM  Result Value Ref Range Status   Specimen Description BLOOD RIGHT HAND  Final   Special Requests BOTTLES DRAWN AEROBIC AND ANAEROBIC  Final   Culture   Final           BLOOD CULTURE RECEIVED NO GROWTH TO DATE CULTURE WILL BE HELD FOR 5 DAYS BEFORE ISSUING A FINAL NEGATIVE REPORT Performed at [facility]    Report Status PENDING  Incomplete  Culture, blood (routine x 2)     Status: None (Preliminary result)   Collection Time: 01/13/15  9:31 AM  Result Value Ref Range Status   Specimen Description BLOOD RIGHT ANTECUBITAL  Final   Special Requests BOTTLES DRAWN AEROBIC AND ANAEROBIC   Final   Culture   Final           BLOOD CULTURE RECEIVED NO GROWTH TO DATE CULTURE WILL BE HELD FOR 5 DAYS BEFORE ISSUING A FINAL NEGATIVE REPORT Performed at [facility]    Report Status PENDING  Incomplete     Scheduled Meds: . benztropine  1 mg Oral Daily  . colchicine  0.6 mg Oral BID  . [START ON 01/26/2015] haloperidol decanoate  100 mg Intramuscular Q6 weeks  . heparin  5,000 Units Subcutaneous 3 times per day  . methylPREDNISolone (SOLU-MEDROL) injection  40 mg Intravenous Q12H  . metoprolol tartrate  25 mg Oral BID  . pneumococcal 23 valent vaccine  0.5 mL Intramuscular Tomorrow-1000  . polyethylene glycol  17 g Oral Daily  . sodium chloride  3 mL Intravenous Q12H   Continuous Infusions: . sodium chloride 75 mL/hr at 01/13/15 2000    Pamella Pert, MD [facility] Pager [phone]. If 7 PM - 7 AM, please contact night-coverage at [email or web address], password [facility] 01/14/2015, 9:50 AM  LOS: 1 day

## 2015-01-14 NOTE — Consult Note (Signed)
Patient ID: [name] Wise MRN: [id number] DOB/AGE: [birth year] 64 y.o.  Admit date: 01/13/2015 Primary Cardiologist: New Reason for Consultation: Elevated troponin  HPI: 64 yo male with history of tobacco abuse, gout, HTN, schizophrenia admitted with bilateral leg pain. He was found to have sinus tachycardia and elevated WBC count. No chest pain on admission but troponin was checked due to tachycardia. Troponin elevated at 1.04. Echo is pending this am. He is being treated with steroids and colchicine for gout flare. Question of contribution of chronic NSAID use to his renal failure.   He denies chest pain or SOB. His only complaint is leg pain. No palpitations, PND, orthopnea, LE edema, dizziness, near syncope or syncope.    Past Medical History  Diagnosis Date  . Gout   . Hypertension   . Schizophrenia   . Tobacco abuse     Family History  Problem Relation Age of Onset  . Hypertension Mother   . Gout Father     History   Social History  . Marital Status: Divorced    Spouse Name: N/A  . Number of Children: N/A  . Years of Education: N/A   Occupational History  . Not on file.   Social History Main Topics  . Smoking status: Current Every Day Smoker    Types: Cigarettes  . Smokeless tobacco: Not on file  . Alcohol Use: No  . Drug Use: No  . Sexual Activity: Yes   Other Topics Concern  . Not on file   Social History Narrative    Past Surgical History  Procedure Laterality Date  . None      No Known Allergies   Hospital Medications:  . benztropine  1 mg Oral Daily  . colchicine  0.6 mg Oral BID  . [START ON 01/26/2015] haloperidol decanoate  100 mg Intramuscular Q6 weeks  . heparin  5,000 Units Subcutaneous 3 times per day  . methylPREDNISolone (SOLU-MEDROL) injection  40 mg Intravenous Q12H  . metoprolol tartrate  25 mg Oral BID  . pneumococcal 23 valent vaccine  0.5 mL Intramuscular Tomorrow-1000  . polyethylene glycol  17 g Oral Daily  . sodium chloride   3 mL Intravenous Q12H    Prior to Admission medications   Medication Sig Start Date End Date Taking? Authorizing Provider  amLODipine (NORVASC) 10 MG tablet Take 10 mg by mouth daily.   Yes Historical Provider, MD  haloperidol decanoate (HALDOL DECANOATE) 100 MG/ML injection Inject 1 mL into the muscle as directed. For 42 days 12/15/14  Yes Historical Provider, MD  predniSONE (DELTASONE) 5 MG tablet Take 1 tablet by mouth 2 (two) times daily. 01/06/15  Yes Historical Provider, MD  probenecid (BENEMID) 500 MG tablet Take 500 mg by mouth 2 (two) times daily.   Yes Historical Provider, MD  benztropine (COGENTIN) 1 MG tablet Take 1 tablet by mouth daily. 12/15/14   Historical Provider, MD  colchicine 0.6 MG tablet 2 now and one in one hour.  Tomorrow 1 po bid Patient not taking: Reported on 01/13/2015 11/26/13   Carmelina Dane, MD  indomethacin (INDOCIN) 25 MG capsule Take 1 capsule (25 mg total) by mouth 3 (three) times daily with meals. Patient not taking: Reported on 01/13/2015 11/26/13   Carmelina Dane, MD  predniSONE (DELTASONE) 20 MG tablet 2 daily with food Patient not taking: Reported on 01/13/2015 09/21/13   Elvina Sidle, MD    Review of systems complete and found to be negative unless listed above  Physical Exam: Blood pressure 156/81, pulse 84, temperature 98.8 F (37.1 C), temperature source Oral, resp. rate 18, height 5\' 6"  (1.676 m), weight 131 lb 6.3 oz (59.6 kg), SpO2 99 %.    General: Well developed, well nourished, NAD  HEENT: OP clear, mucus membranes moist  SKIN: warm, dry. No rashes.  Neuro: No focal deficits  Musculoskeletal: Muscle strength 5/5 all ext  Psychiatric: Mood and affect normal  Neck: No JVD, no carotid bruits, no thyromegaly, no lymphadenopathy.  Lungs:Clear bilaterally, no wheezes, rhonci, crackles  Cardiovascular: Regular rate and rhythm. No murmurs, gallops or rubs.  Abdomen:Soft. Bowel sounds present. Non-tender.  Extremities: No lower extremity  edema. Pulses are 2 + in the bilateral DP/PT.  Labs:   Lab Results  Component Value Date   WBC 12.4* 01/13/2015   HGB 8.6* 01/13/2015   HCT 27.0* 01/13/2015   MCV 100.4* 01/13/2015   PLT 258 01/13/2015     Recent Labs Lab 01/13/15 0920 01/13/15 1601  NA 133*  --   K 4.8  --   CL 102  --   CO2 19  --   BUN 55*  --   CREATININE 3.34* 2.96*  CALCIUM 9.0  --   PROT 6.9  --   BILITOT 0.5  --   ALKPHOS 94  --   ALT 14  --   AST 16  --   GLUCOSE 191*  --    Lab Results  Component Value Date   TROPONINI 1.04* 01/14/2015    Chest x-ray: 01/13/15: FINDINGS: Lungs are clear. Heart is upper normal in size with pulmonary vascularity within normal limits. No adenopathy. No bone lesions. IMPRESSION: No edema or consolidation.  EKG: Sinus, poor R wave progression precordial leads. No ischemic changes.   ASSESSMENT AND PLAN:   1. Elevated troponin: Pt admitted with anemia, acute renal failure and possible infectious process. He has no chest pain. His troponin is elevated but this is likely due to demand ischemia. EKG with poor R wave progression through the precordial leads but no clear ischemic changes. He has no chest pain. This does not appear to be an acute coronary syndrome. He is not a good candidate for cardiac cath currently given acute renal failure. Echo is pending later today.  -For now, I would not recommend systemic anti-coagulation with heparin. Should he develop chest pain or EKG changes, would then consider starting IV heparin.  -Continue beta blocker -Would start ASA 325 mg daily  2. HTN: Per primary team. Consider restarting Norvasc  3. Tobacco abuse: Smoking cessation advised.   We will follow with you.    Signed: Verne Carrowhristopher Staceyann Knouff, MD 01/14/2015, 12:11 PM

## 2015-01-14 NOTE — Progress Notes (Signed)
Critical troponin 1.04. Patient denies chest pain and SOB. Patient in no acute distress. EKG obtained. BP 156/81, Oxygen sat 99% on RA. NP on call paged. Will continue to monitor closely.

## 2015-01-14 NOTE — Progress Notes (Signed)
Echocardiogram 2D Echocardiogram has been performed.  [name] Wise 01/14/2015, 10:52 AM

## 2015-01-14 NOTE — Progress Notes (Signed)
Inpatient Diabetes Program Recommendations  AACE/ADA: New Consensus Statement on Inpatient Glycemic Control (2013)  Target Ranges:  Prepandial:   less than 140 mg/dL      Peak postprandial:   less than 180 mg/dL (1-2 hours)      Critically ill patients:  140 - 180 mg/dL     Results for [name] Wise, [name] Wise (MRN [id number]) as of 01/14/2015 08:23  Ref. Range 01/13/2015 09:20  Glucose Latest Range: 70-99 mg/dL 409191 (H)    Admitted with LE Pain.  History of Gout, HTN, Schizophrenia.  Patient started on IV Solumedrol 40 mg Q12 hours last PM.    MD- Lab glucose elevated yesterday.  Do not see any BMET results for today.  If any more glucose levels are drawn and are elevated, please consider checking patient's CBGs and cover with Novolog Sensitive SSI if elevated while patient on IV steroids     Will follow Ambrose FinlandJeannine Johnston Jamee Pacholski RN, MSN, CDE Diabetes Coordinator Inpatient Diabetes Program Team Pager: [phone] (8a-10p)

## 2015-01-14 NOTE — Progress Notes (Addendum)
Clinical Social Work Department CLINICAL SOCIAL WORK PSYCHIATRY SERVICE LINE ASSESSMENT 01/14/2015  Patient:  [name] Wise  Account:  [id number]  Admit Date:  01/13/2015  Clinical Social Worker:  Sindy Messing, LCSW  Date/Time:  01/14/2015 11:30 AM Referred by:  Physician  Date referred:  01/14/2015 Reason for Referral  Psychosocial assessment   Presenting Symptoms/Problems (In the person's/family's own words):   Psych consulted for medication management.   Abuse/Neglect/Trauma History (check all that apply)  Denies history   Abuse/Neglect/Trauma Comments:   Psychiatric History (check all that apply)  Outpatient treatment   Psychiatric medications:  Cogentin 1 mg  Haldol Decanoate 100 mg   Current Mental Health Hospitalizations/Previous Mental Health History:   Patient reports a history of schizophrenia. Patient has been following up with [facility] in order to have medication managed and injections.   Current provider:   Jodene Nam and Date:   [place], [place]   Current Medications:   Scheduled Meds:      . aspirin EC  325 mg Oral Daily  . benztropine  1 mg Oral Daily  . colchicine  0.6 mg Oral BID  . [START ON 01/26/2015] haloperidol decanoate  100 mg Intramuscular Q6 weeks  . heparin  5,000 Units Subcutaneous 3 times per day  . methylPREDNISolone (SOLU-MEDROL) injection  40 mg Intravenous Q12H  . metoprolol tartrate  25 mg Oral BID  . pneumococcal 23 valent vaccine  0.5 mL Intramuscular Tomorrow-1000  . polyethylene glycol  17 g Oral Daily  . sodium chloride  3 mL Intravenous Q12H        Continuous Infusions:      PRN Meds:.acetaminophen **OR** acetaminophen, ondansetron **OR** ondansetron (ZOFRAN) IV, oxyCODONE-acetaminophen       Previous Impatient Admission/Date/Reason:   None reported   Emotional Health / Current Symptoms    Suicide/Self Harm  None reported   Suicide attempt in the past:   Patient denies any SI or HI.   Other harmful behavior:   None  reported   Psychotic/Dissociative Symptoms  Auditory Hallucinations   Other Psychotic/Dissociative Symptoms:   Patient reports he takes injections for MH so that he does not hear as many voices.    Attention/Behavioral Symptoms  Within Normal Limits   Other Attention / Behavioral Symptoms:   Patient engaged during assessment.    Cognitive Impairment  Within Normal Limits   Other Cognitive Impairment:   Patient alert and oriented.    Mood and Adjustment  Flat    Stress, Anxiety, Trauma, Any Recent Loss/Stressor  None reported   Anxiety (frequency):   N/A   Phobia (specify):   N/A   Compulsive behavior (specify):   N/A   Obsessive behavior (specify):   N/A   Other:   N/A   Substance Abuse/Use  None   SBIRT completed (please refer for detailed history):  N  Self-reported substance use:   Patient denies any substance use.   Urinary Drug Screen Completed:  N Alcohol level:   N/A    Environmental/Housing/Living Arrangement  Stable housing   Who is in the home:   Alone   Emergency contact:  [place]-dtr   Financial  Medicare  Medicaid   Patient's Strengths and Goals (patient's own words):   Patient reports he is independent and cares for himself.   Clinical Social Worker's Interpretive Summary:   CSW received referral in order to complete psychosocial assessment. CSW reviewed chart and met with patient at bedside along with psych MD.    Patient reports  he lives at home alone. Patient moved to the [place] several years but reports he and wife divorced. [place]. Patient reports he does not need assistance around the house and cooks and cleans and does his own grocery shopping. Patient reports he came to the hospital because he was having problems with his legs. Patient reports he wants to get better in order to return back home.    Patient reports that he goes to [facility] because he was informed that he needed a shot  in order to reduce the amount of voices he hears. Patient reports long history of MH but reports no acute concerns. Patient goes to [facility] every 6 weeks for injections and reports they manage his medications as well.    Patient agreeable for CSW to contact friends and family for further information. CSW left messages for friend Stanton Kidney) and dtr ([place]). CSW will continue to follow.   Disposition:  Recommend Psych CSW continuing to support while in hospital   [place], [place] [phone]  Addendum [phone] Dtr returned CSW call and reports that patient and wife divorced when she was 28 years old. Dtr knows that patient was diagnosed with paranoid schizophrenia but reports limited contact with patient and is unable to provide further information. Dtr confirms she lives out of town and reports that friend Stanton Kidney) would be able to provide further information but she does not know much about patient's life due to limited contact.

## 2015-01-15 ENCOUNTER — Inpatient Hospital Stay (HOSPITAL_COMMUNITY): Payer: Medicare Other

## 2015-01-15 DIAGNOSIS — M1000 Idiopathic gout, unspecified site: Secondary | ICD-10-CM | POA: Insufficient documentation

## 2015-01-15 DIAGNOSIS — M10062 Idiopathic gout, left knee: Secondary | ICD-10-CM | POA: Diagnosis not present

## 2015-01-15 DIAGNOSIS — N179 Acute kidney failure, unspecified: Secondary | ICD-10-CM | POA: Insufficient documentation

## 2015-01-15 LAB — PROTEIN ELECTROPHORESIS, SERUM
ALPHA-1-GLOBULIN: 10.4 % — AB (ref 2.9–4.9)
ALPHA-2-GLOBULIN: 24.3 % — AB (ref 7.1–11.8)
Albumin ELP: 44.5 % — ABNORMAL LOW (ref 55.8–66.1)
BETA 2: 6.9 % — AB (ref 3.2–6.5)
Beta Globulin: 5.9 % (ref 4.7–7.2)
Gamma Globulin: 8 % — ABNORMAL LOW (ref 11.1–18.8)
M-Spike, %: NOT DETECTED g/dL
Total Protein ELP: 5.9 g/dL — ABNORMAL LOW (ref 6.0–8.3)

## 2015-01-15 LAB — BASIC METABOLIC PANEL
ANION GAP: 10 (ref 5–15)
BUN: 50 mg/dL — ABNORMAL HIGH (ref 6–23)
CO2: 20 mmol/L (ref 19–32)
Calcium: 8.3 mg/dL — ABNORMAL LOW (ref 8.4–10.5)
Chloride: 109 mmol/L (ref 96–112)
Creatinine, Ser: 2.65 mg/dL — ABNORMAL HIGH (ref 0.50–1.35)
GFR calc non Af Amer: 24 mL/min — ABNORMAL LOW (ref >90)
GFR, EST AFRICAN AMERICAN: 28 mL/min — AB (ref >90)
Glucose, Bld: 102 mg/dL — ABNORMAL HIGH (ref 70–99)
Potassium: 4.7 mmol/L (ref 3.5–5.1)
Sodium: 139 mmol/L (ref 135–145)

## 2015-01-15 LAB — CBC
HEMATOCRIT: 26.1 % — AB (ref 39.0–52.0)
HEMOGLOBIN: 8.2 g/dL — AB (ref 13.0–17.0)
MCH: 32 pg (ref 26.0–34.0)
MCHC: 31.4 g/dL (ref 30.0–36.0)
MCV: 102 fL — ABNORMAL HIGH (ref 78.0–100.0)
Platelets: 248 10*3/uL (ref 150–400)
RBC: 2.56 MIL/uL — AB (ref 4.22–5.81)
RDW: 14.8 % (ref 11.5–15.5)
WBC: 11.7 10*3/uL — ABNORMAL HIGH (ref 4.0–10.5)

## 2015-01-15 LAB — IRON AND TIBC
Iron: 35 ug/dL — ABNORMAL LOW (ref 42–165)
Saturation Ratios: 24 % (ref 20–55)
TIBC: 146 ug/dL — ABNORMAL LOW (ref 215–435)
UIBC: 111 ug/dL — ABNORMAL LOW (ref 125–400)

## 2015-01-15 LAB — FOLATE: Folate: 2.8 ng/mL — ABNORMAL LOW

## 2015-01-15 LAB — URIC ACID: Uric Acid, Serum: 8.8 mg/dL — ABNORMAL HIGH (ref 4.0–7.8)

## 2015-01-15 LAB — RETICULOCYTES
RBC.: 2.56 MIL/uL — AB (ref 4.22–5.81)
RETIC COUNT ABSOLUTE: 38.4 10*3/uL (ref 19.0–186.0)
Retic Ct Pct: 1.5 % (ref 0.4–3.1)

## 2015-01-15 LAB — VITAMIN B12: VITAMIN B 12: 276 pg/mL (ref 211–911)

## 2015-01-15 LAB — FERRITIN: FERRITIN: 444 ng/mL — AB (ref 22–322)

## 2015-01-15 MED ORDER — AMLODIPINE BESYLATE 5 MG PO TABS
5.0000 mg | ORAL_TABLET | Freq: Every day | ORAL | Status: DC
  Administered 2015-01-15 – 2015-01-20 (×6): 5 mg via ORAL
  Filled 2015-01-15 (×6): qty 1

## 2015-01-15 NOTE — Progress Notes (Signed)
64 yo male with history of tobacco abuse, gout, HTN, schizophrenia admitted with bilateral leg pain. He was found to have sinus tachycardia and elevated WBC count. No chest pain on admission but troponin was checked due to tachycardia. Troponin elevated at 1.04. Echo is pending this am. He is being treated with steroids and colchicine for gout flare. Question of contribution of chronic NSAID use to his renal failure.   He denies chest pain or SOB. His only complaint is leg pain. No palpitations, PND, orthopnea, LE edema, dizziness, near syncope or syncope.   Subjective:  No c/o CP or SOB.  Objective:  Vital Signs in the last 24 hours: Temp:  [98.5 F (36.9 C)-98.7 F (37.1 C)] 98.5 F (36.9 C) (02/11 0445) Pulse Rate:  [71-87] 72 (02/11 0445) Resp:  [16-18] 16 (02/11 0445) BP: (135-164)/(66-89) 164/89 mmHg (02/11 0445) SpO2:  [98 %-100 %] 100 % (02/11 0445)  Intake/Output from previous day: 02/10 0701 - 02/11 0700 In: 562.5 [P.O.:240; I.V.:322.5] Out: 3500 [Urine:3500] Intake/Output from this shift: Total I/O In: 562.5 [P.O.:240; I.V.:322.5] Out: 2000 [Urine:2000]  Physical Exam: General: Well developed, well nourished, NAD  HEENT: OP clear, mucus membranes moist  SKIN: warm, dry. No rashes.  Neuro: No focal deficits  Musculoskeletal: Muscle strength 5/5 all ext  Psychiatric: Mood and affect normal  Neck: No JVD, no carotid bruits, no thyromegaly, no lymphadenopathy.  Lungs:Clear bilaterally, no wheezes, rhonci, crackles  Cardiovascular: Regular rate and rhythm. No murmurs, gallops or rubs.  Abdomen:Soft. Bowel sounds present. Non-tender.  Extremities: No lower extremity edema. Pulses are 2 + in the bilateral DP/PT. =- very tender  Lab Results:  Recent Labs  01/13/15 1601 01/15/15 0430  WBC 12.4* 11.7*  HGB 8.6* 8.2*  PLT 258 248    Recent Labs  01/14/15 1025 01/15/15 0430  NA 136 139  K 4.6 4.7  CL 108 109  CO2 19 20  GLUCOSE 170* 102*  BUN  47* 50*  CREATININE 2.68* 2.65*    Recent Labs  01/13/15 2054 01/14/15 0241  TROPONINI 0.77* 1.04*   Hepatic Function Panel  Recent Labs  01/13/15 0920  PROT 6.9  ALBUMIN 2.8*  AST 16  ALT 14  ALKPHOS 94  BILITOT 0.5   No results for input(s): CHOL in the last 72 hours. No results for input(s): PROTIME in the last 72 hours.  Imaging: Imaging results have been reviewed  Cardiac Studies:  Echo: EF 55-60%, normal wall motion, Gr 1 diastolic dysfunction, otherwise relatively normal.  Tele: NSR 70s  Assessment/Plan:  Active Problems:   AKI (acute kidney injury)   Gout flare   Sinus tachycardia   Elevated troponin   Essential hypertension   Tobacco abuse   Hyponatremia   Leukocytosis   Macrocytic anemia  1. Elevated troponin:  Likely Demand Ischemia in setting of: anemia, acute renal failure and possible infectious process. He has no chest pain. EKG with poor R wave progression through the precordial leads but no clear ischemic changes. He has no chest pain. This does not appear to be an acute coronary syndrome. He is not a good candidate for cardiac cath currently given acute renal failure. Echo is pending from yesterday shows normal EF with normal wall motion.  - Agree with recommendation NOT to administer systemic anti-coagulation with heparin. Should he develop chest pain or EKG changes, would then consider starting IV heparin - but would prefer knowing source of bleed. -Continue beta blocker -Would start ASA 325 mg daily  -  Once stable - could consider Non-invasive ischemic evaluation with Myoview, but would not consider non-emergent invasive (CATH) evaluation in setting of AKI.  2. HTN: Per primary team. Consider restarting Norvasc  3. Tobacco abuse: Smoking cessation advised.     LOS: 2 days    Rayshun Kandler W 01/15/2015, 6:57 AM

## 2015-01-15 NOTE — Evaluation (Signed)
Physical Therapy Evaluation Patient Details Name: [name] Wise MRN: [id number] DOB: 1951/07/25 Today's Date: 01/15/2015   History of Present Illness  64 yo male admitted with gout flare. Pt is [place]  Clinical Impression  On eval, pt required Min-Mod assist for mobility-only able to tolerate stand pivot from bed to [facility]. Difficulty WBing on L LE. Pt unable to attempt ambulation during this session. Will continue to follow. Recommend ST rehab currently but pt may be able to return home if pain control and mobility improve significantly.    Follow Up Recommendations SNF (depending on progress. May be able to return home if pain and mobility improve significantly)    Equipment Recommendations  Rolling walker with 5" wheels    Recommendations for Other Services       Precautions / Restrictions Precautions Precautions: Fall Restrictions Weight Bearing Restrictions: No      Mobility  Bed Mobility Overal bed mobility: Needs Assistance Bed Mobility: Supine to Sit;Sit to Supine     Supine to sit: Supervision Sit to supine: Supervision   General bed mobility comments: supervision for lines  Transfers Overall transfer level: Needs assistance Equipment used: Rolling walker (2 wheeled);None Transfers: Sit to/from [employer] CorporationStand;Stand Pivot Transfers Sit to Stand: Min assist;Mod assist Stand pivot transfers: Mod assist;Min assist       General transfer comment: Mod assist to stand and pivot without device. Min assist with use of RW. Stand pivot x2, bed<.bsc.   Ambulation/Gait             General Gait Details: NT-Pt repeatedly stated "I can't walk.Marland Kitchen.Marland Kitchen.I can't walk"  Stairs            Wheelchair Mobility    Modified Rankin (Stroke Patients Only)       Balance Overall balance assessment: Needs assistance         Standing balance support: Bilateral upper extremity supported;During functional activity Standing balance-Leahy Scale: Poor Standing balance comment: needs  RW for support                             Pertinent Vitals/Pain Pain Assessment: Faces Faces Pain Scale: Hurts whole lot Pain Location: L foot    Home Living Family/patient expects to be discharged to:: Unsure Living Arrangements: Alone             Home Equipment: Crutches      Prior Function Level of Independence: Independent with assistive device(s)         Comments: using crutches     Hand Dominance        Extremity/Trunk Assessment               Lower Extremity Assessment: LLE deficits/detail   LLE Deficits / Details: swelling noted L foot/ankle  Cervical / Trunk Assessment: Kyphotic  Communication   Communication: Prefers language other than English  Cognition Arousal/Alertness: Awake/alert Behavior During Therapy: WFL for tasks assessed/performed Overall Cognitive Status: Within Functional Limits for tasks assessed                      General Comments      Exercises        Assessment/Plan    PT Assessment Patient needs continued PT services  PT Diagnosis Difficulty walking;Abnormality of gait;Acute pain   PT Problem List Decreased activity tolerance;Decreased balance;Decreased mobility;Pain;Decreased knowledge of use of DME  PT Treatment Interventions DME instruction;Gait training;Functional mobility training;Therapeutic activities;Therapeutic exercise;Patient/family education;Balance training   PT  Goals (Current goals can be found in the Care Plan section) Acute Rehab PT Goals Patient Stated Goal: none stated PT Goal Formulation: Patient unable to participate in goal setting Time For Goal Achievement: 01/29/15 Potential to Achieve Goals: Good    Frequency Min 3X/week   Barriers to discharge        Co-evaluation               End of Session   Activity Tolerance: Patient limited by pain Patient left: in bed;with call bell/phone within reach;with bed alarm set           Time: 1610-9604 PT  Time Calculation (min) (ACUTE ONLY): 20 min   Charges:   PT Evaluation $Initial PT Evaluation Tier I: 1 Procedure     PT G Codes:        Rebeca Alert, MPT Pager: [phone]

## 2015-01-15 NOTE — Progress Notes (Signed)
PROGRESS NOTE  [name] Wise ZOX:[id number] DOB: 20-Jun-1951 DOA: 01/13/2015 PCP: [name] Ok, MD  HPI: 64 y.o. male with past medical history of gout and essential hypertension that is brought to the emergency room by EMS. Per EMS reports the living conditions were in horrible. He relates I lateral lower extremity pain worst on the right great toe and ankle but also at the knee on the right and left knee and ankle chest progressively gotten worse over the last month. He relates he has been taking his indomethacin without any improvement, he is barely able to ambulate and he reaches crutches to move around, he also relates some urinary incontinence. Pt's denies any chest, sob or palpitation.  Subjective / 24 H Interval events Ongoing toe pain  Assessment/Plan: Active Problems:   Gout flare   Leukocytosis   Hyponatremia   Macrocytic anemia   Sinus tachycardia   AKI (acute kidney injury)   Elevated troponin   Essential hypertension   Tobacco abuse   Acute kidney injury - this is probably related to his NSAID use, avoid further use of indomethacin, he is improving some with hydration  - suspect underlying chronic kidney disease from long standing NSAID use and uncontrolled HTN - obtain renal US   HTN - on metoprolol, persistently elevated BPs, will start Amlodipine  Acute Gout flare: - DC the indomethacin, start him on IV steroids, transition to prednisone in am - PT evaluated patient, recommending SNF  Leukocytosis: - Unclear etiology has remained afebrile, this could be due to to the acute renal failure and or acute gout flare. - The patient also was on prednisone tapered which could explain his leukocytosis - improving with supportive treatment  Hyponatremia: - Likely due to dehydration, improving with IV fluids  Macrocytic anemia: - Check an anemia panel for B-12 and RBC folate deficiency,  - with probable underlying CKD as a component  Sinus tachycardia, troponin  elevation - Unclear etiology. - 2-D echo without any wall motion abnormalities, cardiology consulted, appreciate input - started ASA 325 daily  History of paranoid schizophrenia -Psychiatry consulted, appreciate input   Diet: Diet Heart Fluids: NS DVT Prophylaxis: Heparin  Code Status: Full Code Family Communication: d/w patient  Disposition Plan: SNF when ready   Consultants:  Cardiology   Procedures:  None    Antibiotics  Anti-infectives    None       Studies  No results found.  Objective  Filed Vitals:   01/14/15 1428 01/14/15 2119 01/15/15 0445 01/15/15 1033  BP: 135/66 161/85 164/89 177/95  Pulse: 71 87 72 89  Temp: 98.7 F (37.1 C) 98.6 F (37 C) 98.5 F (36.9 C)   TempSrc: Oral Oral Oral   Resp: Height:      Weight:      SpO2: 99% 98% 100% 100%    Intake/Output Summary (Last 24 hours) at 01/15/15 1212 Last data filed at 01/15/15 1100  Gross per 24 hour  Intake  802.5 ml  Output   3101 ml  Net -2298.5 ml   Filed Weights   01/13/15 1452  Weight: 59.6 kg (131 lb 6.3 oz)    Exam:  General:  NAD  HEENT: no scleral icterus  Cardiovascular: RRR  Respiratory: CTA biL  Abdomen: soft, non tender  MSK/Extremities: no clubbing/cyanosis, mild bilateral knee swelling improved, very tender left toe  Skin: no rashes  Neuro: non focal   Data Reviewed: Basic Metabolic Panel:  Recent Labs Lab 01/13/15 0920 01/13/15  1601 01/14/15 1025 01/15/15 0430  NA 133*  --  136 139  K 4.8  --  4.6 4.7  CL 102  --  108 109  CO2 19  --  19 20  GLUCOSE 191*  --  170* 102*  BUN 55*  --  47* 50*  CREATININE 3.34* 2.96* 2.68* 2.65*  CALCIUM 9.0  --  8.3* 8.3*   Liver Function Tests:  Recent Labs Lab 01/13/15 0920  AST 16  ALT 14  ALKPHOS 94  BILITOT 0.5  PROT 6.9  ALBUMIN 2.8*   CBC:  Recent Labs Lab 01/13/15 0920 01/13/15 1601 01/15/15 0430  WBC 13.6* 12.4* 11.7*  NEUTROABS 12.7*  --   --   HGB 9.5* 8.6* 8.2*   HCT 29.8* 27.0* 26.1*  MCV 101.4* 100.4* 102.0*  PLT 274 258 248   Cardiac Enzymes:  Recent Labs Lab 01/13/15 1601 01/13/15 2054 01/14/15 0241  TROPONINI 0.55* 0.77* 1.04*    Recent Results (from the past 240 hour(s))  Culture, blood (routine x 2)     Status: None (Preliminary result)   Collection Time: 01/13/15  9:23 AM  Result Value Ref Range Status   Specimen Description BLOOD RIGHT HAND  Final   Special Requests BOTTLES DRAWN AEROBIC AND ANAEROBIC 2ML  Final   Culture   Final           BLOOD CULTURE RECEIVED NO GROWTH TO DATE CULTURE WILL BE HELD FOR 5 DAYS BEFORE ISSUING A FINAL NEGATIVE REPORT [facility]formed [facility]    Report Status PENDING  Incomplete  Culture, blood (routine x 2)     Status: None (Preliminary result)   Collection Time: 01/13/15  9:31 AM  Result Value Ref Range Status   Specimen Description BLOOD RIGHT ANTECUBITAL  Final   Special Requests BOTTLES DRAWN AEROBIC AND ANAEROBIC  5ML  Final   Culture   Final           BLOOD CULTURE RECEIVED NO GROWTH TO DATE CULTURE WILL BE HELD FOR 5 DAYS BEFORE ISSUING A FINAL NEGATIVE REPORT Performed at [facility]    Report Status PENDING  Incomplete  Urine culture     Status: None   Collection Time: 01/13/15 11:36 AM  Result Value Ref Range Status   Specimen Description URINE, CLEAN CATCH  Final   Special Requests NONE  Final   Colony Count NO GROWTH Performed at [facility]   Final   Culture NO GROWTH Performed at [facility]   Final   Report Status 01/14/2015 FINAL  Final     Scheduled Meds: . aspirin EC  325 mg Oral Daily  . benztropine  1 mg Oral Daily  . colchicine  0.6 mg Oral BID  . [START ON 01/26/2015] haloperidol decanoate  100 mg Intramuscular Q6 weeks  . heparin  5,000 Units Subcutaneous 3 times per day  . methylPREDNISolone (SOLU-MEDROL) injection  40 mg Intravenous Q12H  . metoprolol tartrate  25 mg Oral BID  . sodium chloride  3 mL Intravenous Q12H    Continuous Infusions:    [name] Pertostin Jaze Rodino, MD [facility] Pager [phone]. If 7 PM - 7 AM, please contact night-coverage at [email or web address], password [email or web address] 01/15/2015, 12:12 PM  LOS: 2 days

## 2015-01-15 NOTE — Progress Notes (Signed)
Clinical Social Work Department BRIEF PSYCHOSOCIAL ASSESSMENT 01/15/2015  Patient:  [name] Wise,[name] Wise     Account Number:  [id number]     Admit date:  01/13/2015  Clinical Social Worker:  Orpah GreekFOLEY,Adama Ivins, LCSWA  Date/Time:  01/15/2015 03:23 PM  Referred by:  Physician  Date Referred:  01/15/2015 Referred for  SNF Placement   Other Referral:   Interview type:  Patient Other interview type:    PSYCHOSOCIAL DATA Living Status:   Admitted from facility:   Level of care:   Primary support name:  Rande LawmanMary Wakeman (friend) ph#: [phone] Primary support relationship to patient:  FRIEND Degree of support available:   fair    CURRENT CONCERNS Current Concerns  Post-Acute Placement   Other Concerns:    SOCIAL WORK ASSESSMENT / PLAN CSW reviewed PT evaluation recommending SNF at discharge.   Assessment/plan status:  [facility] to [facility] Other assessment/ plan:   Information/referral to community resources:   CSW completed FL[facility] and [facility] and provided SNF list to patient.    PATIENT'S/FAMILY'S RESPONSE TO PLAN OF CARE: Patient expressed to CSW that he could not go home right now - "I can't walk" CSW reassured him that going to a rehab facility would help him with walking. Patient informed CSW that he lives with his landlord and that he does not feel that his living conditions are concerning. [facility], Tammy @ SNF made aware.          Lincoln MaxinKelly Manda Holstad, LCSW [facility] Clinical Social Worker cell #: [phone]

## 2015-01-16 DIAGNOSIS — E538 Deficiency of other specified B group vitamins: Secondary | ICD-10-CM | POA: Insufficient documentation

## 2015-01-16 DIAGNOSIS — I248 Other forms of acute ischemic heart disease: Secondary | ICD-10-CM

## 2015-01-16 DIAGNOSIS — D509 Iron deficiency anemia, unspecified: Secondary | ICD-10-CM

## 2015-01-16 LAB — BASIC METABOLIC PANEL
Anion gap: 12 (ref 5–15)
BUN: 54 mg/dL — ABNORMAL HIGH (ref 6–23)
CO2: 22 mmol/L (ref 19–32)
Calcium: 8.4 mg/dL (ref 8.4–10.5)
Chloride: 105 mmol/L (ref 96–112)
Creatinine, Ser: 2.75 mg/dL — ABNORMAL HIGH (ref 0.50–1.35)
GFR, EST AFRICAN AMERICAN: 27 mL/min — AB (ref >90)
GFR, EST NON AFRICAN AMERICAN: 23 mL/min — AB (ref >90)
GLUCOSE: 107 mg/dL — AB (ref 70–99)
Potassium: 4.7 mmol/L (ref 3.5–5.1)
SODIUM: 139 mmol/L (ref 135–145)

## 2015-01-16 LAB — CBC
HCT: 26.2 % — ABNORMAL LOW (ref 39.0–52.0)
Hemoglobin: 8.4 g/dL — ABNORMAL LOW (ref 13.0–17.0)
MCH: 32.2 pg (ref 26.0–34.0)
MCHC: 32.1 g/dL (ref 30.0–36.0)
MCV: 100.4 fL — AB (ref 78.0–100.0)
Platelets: 249 10*3/uL (ref 150–400)
RBC: 2.61 MIL/uL — AB (ref 4.22–5.81)
RDW: 14.4 % (ref 11.5–15.5)
WBC: 9.5 10*3/uL (ref 4.0–10.5)

## 2015-01-16 LAB — OCCULT BLOOD X 1 CARD TO LAB, STOOL: FECAL OCCULT BLD: NEGATIVE

## 2015-01-16 MED ORDER — FERROUS GLUCONATE 324 (38 FE) MG PO TABS
324.0000 mg | ORAL_TABLET | Freq: Every day | ORAL | Status: DC
Start: 2015-01-17 — End: 2015-01-20
  Administered 2015-01-17 – 2015-01-20 (×4): 324 mg via ORAL
  Filled 2015-01-16 (×4): qty 1

## 2015-01-16 MED ORDER — FOLIC ACID 1 MG PO TABS
1.0000 mg | ORAL_TABLET | Freq: Every day | ORAL | Status: DC
  Administered 2015-01-16 – 2015-01-20 (×5): 1 mg via ORAL
  Filled 2015-01-16 (×5): qty 1

## 2015-01-16 MED ORDER — PREDNISONE 20 MG PO TABS
20.0000 mg | ORAL_TABLET | Freq: Every day | ORAL | Status: DC
Start: 2015-01-17 — End: 2015-01-20
  Administered 2015-01-17 – 2015-01-20 (×4): 20 mg via ORAL
  Filled 2015-01-16 (×4): qty 1

## 2015-01-16 NOTE — Progress Notes (Signed)
Physical Therapy Treatment Patient Details Name: [name] CorrenteChay Mancino MRN: [id number] DOB: August 10, 1951 Today's Date: 01/16/2015    History of Present Illness 64 yo male admitted with gout flare. Pt is [place]    PT Comments    Progressing slowly with mobility. Continue to recommend SNF.   Follow Up Recommendations  SNF     Equipment Recommendations  Rolling walker with 5" wheels    Recommendations for Other Services       Precautions / Restrictions Precautions Precautions: Fall Restrictions Weight Bearing Restrictions: No    Mobility  Bed Mobility Overal bed mobility: Needs Assistance Bed Mobility: Supine to Sit;Sit to Supine     Supine to sit: Supervision;HOB elevated Sit to supine: Supervision;HOB elevated   General bed mobility comments: supervision for lines  Transfers Overall transfer level: Needs assistance Equipment used: Rolling walker (2 wheeled) Transfers: Sit to/from Stand Sit to Stand: Mod assist         General transfer comment: Mod assist to stand. Multimodal cues for safey, hand placement.   Ambulation/Gait Ambulation/Gait assistance: Min assist Ambulation Distance (Feet): 15 Feet Assistive device: Rolling walker (2 wheeled) Gait Pattern/deviations: Step-to pattern;Decreased stride length     General Gait Details: slow gait speed. Multimodal cues for safety, technique. Pt tolerated ambulation better this session. Able to walk short distance in room.    Stairs            Wheelchair Mobility    Modified Rankin (Stroke Patients Only)       Balance           Standing balance support: Bilateral upper extremity supported;During functional activity Standing balance-Leahy Scale: Poor                      Cognition Arousal/Alertness: Awake/alert Behavior During Therapy: WFL for tasks assessed/performed Overall Cognitive Status: Difficult to assess                      Exercises      General Comments         Pertinent Vitals/Pain Pain Assessment: Faces Faces Pain Scale: Hurts little more Pain Location: L foot Pain Intervention(s): Monitored during session    Home Living                      Prior Function            PT Goals (current goals can now be found in the care plan section) Progress towards PT goals: Progressing toward goals (slowly)    Frequency  Min 3X/week    PT Plan Current plan remains appropriate    Co-evaluation             End of Session   Activity Tolerance: Patient tolerated treatment well Patient left: in bed;with call bell/phone within reach;with bed alarm set     Time: 1410-1419 PT Time Calculation (min) (ACUTE ONLY): 9 min  Charges:  $Gait Training: 8-22 mins                    G Codes:      Rebeca AlertJannie Ryker [place], MPT Pager: [phone]

## 2015-01-16 NOTE — Progress Notes (Signed)
64 yo male with history of tobacco abuse, gout, HTN, schizophrenia admitted with bilateral leg pain. He was found to have sinus tachycardia and elevated WBC count. No chest pain on admission but troponin was checked due to tachycardia. Troponin elevated at 1.04. Echo is pending this am. He is being treated with steroids and colchicine for gout flare. Question of contribution of chronic NSAID use to his renal failure.   On arrival, he denied chest pain or SOB. His only complaint is leg pain. No palpitations, PND, orthopnea, LE edema, dizziness, near syncope or syncope.   Subjective:  No c/o CP or SOB - unfortunately communication is very difficult & interpreter is not readily available.  Objective:  Vital Signs in the last 24 hours: Temp:  [97.9 F (36.6 C)-98.5 F (36.9 C)] 98.3 F (36.8 C) (02/12 0521) Pulse Rate:  [71-91] 71 (02/12 0521) Resp:  [16] 16 (02/12 0521) BP: (157-177)/(78-95) 173/78 mmHg (02/12 0521) SpO2:  [100 %] 100 % (02/12 0521)  Intake/Output from previous day: 02/11 0701 - 02/12 0700 In: 720 [P.O.:720] Out: 4351 [Urine:4350; Stool:1] Intake/Output from this shift: Total I/O In: -  Out: 1750 [Urine:1750]  Physical Exam: General: Well developed, well nourished, NAD  Neck: No JVD, no carotid bruits, no thyromegaly, no lymphadenopathy.  Lungs:Clear bilaterally, no wheezes, rhonci, crackles  Cardiovascular: Regular rate and rhythm. No murmurs, gallops or rubs.  Abdomen:Soft. Bowel sounds present. Non-tender.  Extremities: No lower extremity edema. Pulses are 2 + in the bilateral DP/PT. =- very tender  Lab Results:  Recent Labs  01/15/15 0430 01/16/15 0514  WBC 11.7* 9.5  HGB 8.2* 8.4*  PLT 248 249    Recent Labs  01/14/15 1025 01/15/15 0430  NA 136 139  K 4.6 4.7  CL 108 109  CO2 19 20  GLUCOSE 170* 102*  BUN 47* 50*  CREATININE 2.68* 2.65*    Recent Labs  01/13/15 2054 01/14/15 0241  TROPONINI 0.77* 1.04*   Hepatic Function  Panel  Recent Labs  01/13/15 0920  PROT 6.9  ALBUMIN 2.8*  AST 16  ALT 14  ALKPHOS 94  BILITOT 0.5   No results for input(s): CHOL in the last 72 hours. No results for input(s): PROTIME in the last 72 hours.  Imaging: Imaging results have been reviewed  Cardiac Studies:  Echo: EF 55-60%, normal wall motion, Gr 1 diastolic dysfunction, otherwise relatively normal.  Tele: NSR 70s  Assessment/Plan:  Active Problems:   AKI (acute kidney injury)   Demand ischemia of myocardium   Gout flare   Sinus tachycardia   Essential hypertension   Tobacco abuse   Hyponatremia   Leukocytosis   Macrocytic anemia   Primary gout   Acute kidney injury  1. Elevated troponin/ Demand Ischemia --> in setting of: anemia, acute renal failure and possible infectious process.  No c/o chest Pain, Normal Echo & no ischemic EKG findings.==> This does not appear to be an acute coronary syndrome.   He is not a good candidate for cardiac cath currently given acute renal failure.   Agree with recommendation NOT to administer systemic anti-coagulation with heparin.   Should he develop chest pain or EKG changes, would then consider starting IV heparin - but would prefer knowing source of bleed. - Continue beta blocker - can increase as BP & HR will tolerate - ASA 325 mg daily  - Once stable - could consider Non-invasive ischemic evaluation with Myoview, but would not consider non-emergent invasive (CATH) evaluation in  setting of AKI.  2. HTN: Per primary team. Norvasc Added yesterday (provides additional anti-anginal / ischmemic effect  3. Renal function has remained stable, but is now diuresing (? Post ATN diuresis - monitor electrolytes)  4. Tobacco abuse: Smoking cessation advised.   We will monitor & be available as needed over the weekend.  Will check back in if still present on Monday. Contact weekend rounding [facility] MD if clinical condition improves to the extent that Myoview would be  reasonable. (stable renal function & Hgb).   LOS: 3 days    Madalynn Pickelsimer W 01/16/2015, 6:17 AM

## 2015-01-16 NOTE — Progress Notes (Signed)
PROGRESS NOTE  [name] Wise ZOX:[id number] DOB: Dec 21, 1950 DOA: 01/13/2015 PCP: Ralene Ok, MD  HPI: 64 y.o. male with past medical history of gout and essential hypertension that is brought to the emergency room by EMS. Per EMS reports the living conditions were in horrible. He relates I lateral lower extremity pain worst on the right great toe and ankle but also at the knee on the right and left knee and ankle chest progressively gotten worse over the last month. He relates he has been taking his indomethacin without any improvement, he is barely able to ambulate and he reaches crutches to move around, he also relates some urinary incontinence. Pt's denies any chest, sob or palpitation.  Subjective / 24 H Interval events Feels better, is happy, today is his birthday  Assessment/Plan: Active Problems:   Gout flare   Leukocytosis   Hyponatremia   Macrocytic anemia   Sinus tachycardia   AKI (acute kidney injury)   Demand ischemia of myocardium   Essential hypertension   Tobacco abuse   Primary gout   Acute kidney injury   Acute kidney injury - this is probably related to his NSAID use, avoid further use of indomethacin, he is improving some with hydration  - suspect underlying chronic kidney disease from long standing NSAID use and uncontrolled HTN - initially renal function improving, now trending up, I have consulted nephrology, he is ~ stage IV   HTN - on metoprolol, persistently elevated BPs, have started Amlodipine  Acute Gout flare: - DC the indomethacin, continue Prednisone - PT evaluated patient, recommending SNF, has a bed  Leukocytosis: - Unclear etiology has remained afebrile, this could be due to to the acute renal failure and or acute gout flare. - The patient also was on prednisone tapered which could explain his leukocytosis - improving with supportive treatment  Hyponatremia: - Likely due to dehydration, improving with IV fluids  Macrocytic anemia: - has iron  deficiency and folate deficiency, start supplements   Sinus tachycardia, troponin elevation - Unclear etiology. - 2-D echo without any wall motion abnormalities, cardiology consulted, appreciate input - started ASA 325 daily  History of paranoid schizophrenia -Psychiatry consulted, appreciate input   Diet: Diet Heart Fluids: NS DVT Prophylaxis: Heparin  Code Status: Full Code Family Communication: d/w patient  Disposition Plan: SNF when ready   Consultants:  Cardiology   Nephrology   Procedures:  None    Antibiotics  Anti-infectives    None       Studies  US Renal  01/15/2015   CLINICAL DATA:  Initial evaluation for renal failure  EXAM: RENAL/URINARY TRACT ULTRASOUND COMPLETE  COMPARISON:  Renal ultrasound 10/03/2003  FINDINGS: Right Kidney:  Length: 10.0 cm. Significant cortical thinning. Increased echogenicity. No hydronephrosis.  Left Kidney:  Length: 9.5 cm. Significant cortical thinning. Increased echogenicity. No hydronephrosis.  Bladder:  Bladder is decompressed by Foley catheter.  IMPRESSION: Similar to prior study there is significant cortical atrophy with evidence of medical renal disease but there are no acute findings.   Electronically Signed   By: Esperanza Heir M.D.   On: 01/15/2015 15:56    Objective  Filed Vitals:   01/15/15 1524 01/15/15 2131 01/16/15 0521 01/16/15 0622  BP: 157/88 157/78 173/78 172/79  Pulse: 91 75 71   Temp: 97.9 F (36.6 C) 98.5 F (36.9 C) 98.3 F (36.8 C)   TempSrc: Oral Oral Oral   Resp: Height:      Weight:  SpO2: 100% 100% 100%     Intake/Output Summary (Last 24 hours) at 01/16/15 1634 Last data filed at 01/16/15 0553  Gross per 24 hour  Intake    240 ml  Output   3250 ml  Net  -3010 ml   Filed Weights   01/13/15 1452  Weight: 59.6 kg (131 lb 6.3 oz)    Exam:  General:  NAD  HEENT: no scleral icterus  Cardiovascular: RRR  Respiratory: CTA biL  Abdomen: soft, non  tender  MSK/Extremities: no clubbing/cyanosis, mild bilateral knee swelling improved, very tender left toe  Skin: no rashes  Neuro: non focal   Data Reviewed: Basic Metabolic Panel:  Recent Labs Lab 01/13/15 0920 01/13/15 1601 01/14/15 1025 01/15/15 0430 01/16/15 0514  NA 133*  --  136 139 139  K 4.8  --  4.6 4.7 4.7  CL 102  --  108 109 105  CO2 19  --  GLUCOSE 191*  --  170* 102* 107*  BUN 55*  --  47* 50* 54*  CREATININE 3.34* 2.96* 2.68* 2.65* 2.75*  CALCIUM 9.0  --  8.3* 8.3* 8.4   Liver Function Tests:  Recent Labs Lab 01/13/15 0920  AST 16  ALT 14  ALKPHOS 94  BILITOT 0.5  PROT 6.9  ALBUMIN 2.8*   CBC:  Recent Labs Lab 01/13/15 0920 01/13/15 1601 01/15/15 0430 01/16/15 0514  WBC 13.6* 12.4* 11.7* 9.5  NEUTROABS 12.7*  --   --   --   HGB 9.5* 8.6* 8.2* 8.4*  HCT 29.8* 27.0* 26.1* 26.2*  MCV 101.4* 100.4* 102.0* 100.4*  PLT 274 258 248 249   Cardiac Enzymes:  Recent Labs Lab 01/13/15 1601 01/13/15 2054 01/14/15 0241  TROPONINI 0.55* 0.77* 1.04*    Recent Results (from the past 240 hour(s))  Culture, blood (routine x 2)     Status: None (Preliminary result)   Collection Time: 01/13/15  9:23 AM  Result Value Ref Range Status   Specimen Description BLOOD RIGHT HAND  Final   Special Requests BOTTLES DRAWN AEROBIC AND ANAEROBIC  Final   Culture   Final           BLOOD CULTURE RECEIVED NO GROWTH TO DATE CULTURE WILL BE HELD FOR 5 DAYS BEFORE ISSUING A FINAL NEGATIVE REPORT Performed at [facility]    Report Status PENDING  Incomplete  Culture, blood (routine x 2)     Status: None (Preliminary result)   Collection Time: 01/13/15  9:31 AM  Result Value Ref Range Status   Specimen Description BLOOD RIGHT ANTECUBITAL  Final   Special Requests BOTTLES DRAWN AEROBIC AND ANAEROBIC   Final   Culture   Final           BLOOD CULTURE RECEIVED NO GROWTH TO DATE CULTURE WILL BE HELD FOR 5 DAYS BEFORE ISSUING A FINAL  NEGATIVE REPORT Performed at [facility]    Report Status PENDING  Incomplete  Urine culture     Status: None   Collection Time: 01/13/15 11:36 AM  Result Value Ref Range Status   Specimen Description URINE, CLEAN CATCH  Final   Special Requests NONE  Final   Colony Count NO GROWTH Performed at [facility]   Final   Culture NO GROWTH Performed at [facility]   Final   Report Status 01/14/2015 FINAL  Final     Scheduled Meds: . amLODipine  5 mg Oral Daily  .  aspirin EC  325 mg Oral Daily  . benztropine  1 mg Oral Daily  . [START ON 01/26/2015] haloperidol decanoate  100 mg Intramuscular Q6 weeks  . heparin  5,000 Units Subcutaneous 3 times per day  . methylPREDNISolone (SOLU-MEDROL) injection  40 mg Intravenous Q12H  . metoprolol tartrate  25 mg Oral BID  . sodium chloride  3 mL Intravenous Q12H   Continuous Infusions:    Pamella Pertostin Gherghe, MD [facility] Pager [phone]. If 7 PM - 7 AM, please contact night-coverage at [email or web address], password [email or web address] 01/16/2015, 4:34 PM  LOS: 3 days

## 2015-01-16 NOTE — Progress Notes (Signed)
Clinical Social Work  CSW staffed case with psych MD. Psych MD has reviewed medications and agreeable with plan for patient to DC to SNF when medically stable. Psych CSW signing off but unit CSW to assist with SNF placement.  AllianceHolly Wynn [place], [place] [phone]

## 2015-01-16 NOTE — Progress Notes (Signed)
CARE MANAGEMENT NOTE 01/16/2015  Patient:  [name] Wise,[name] Wise   Account Number:  [id number]  Date Initiated:  01/13/2015  Documentation initiated by:  Trinna BalloonMcGIBBONEY,[name]  Subjective/Objective Assessment:   pt admitted with AKI, LE pain/from [place]     Action/Plan:   from home   Anticipated DC Date:  01/15/2015   Anticipated DC Plan:  HOME/SELF CARE      DC Planning Services  CM consult      Choice offered to / List presented to:             Status of service:   Medicare Important Message given?  YES (If response is "NO", the following Medicare IM given date fields will be blank) Date Medicare IM given:  01/16/2015 Medicare IM given by:  Trinna BalloonMcGIBBONEY,COOKIE [name] Date Additional Medicare IM given:   Additional Medicare IM given by:    Discharge Disposition:  SKILLED NURSING FACILITY  Per UR Regulation:  Reviewed for med. necessity/level of care/duration of stay  If discussed at Long Length of Stay Meetings, dates discussed:    Comments:  01/16/15 MMcGibboney, RN, BSN Pt discharging to SNF. CSW following.   01/13/15 MMcGibboney, RN, BSN Pt speak little English is [place].

## 2015-01-16 NOTE — Consult Note (Signed)
Renal Service Consult Note [facility]  [name] Wise 01/16/2015 [name] Wise D Requesting Physician:  Dr Elvera Lennox  Reason for Consult:  Patient w elevated creatinine 2.7 HPI: The patient is a 64 y.o. year-old [place] male moved to [place] in 1984 and has been working ever since in [employer]" in [place]. History obtained through phone interpreter.  Patient hasn't seen a doctor since moving here, "working all the time".  Apparently has a daughter here.  Presented for weakness and pain in extremities, especially of the R great toe and ankle. .  Denies SOB, CP, abd pain, n/v/d. Denies hx of kidney failure/ disease. Does give hx of incontinence of urine.  No hematuria. Does use OTC medications at home and specifically has been taking indomethacin.  In ED creat was up at 2.7 and pt admitted.  No old labs. Renal US shows 10 cm kidneys w significant cortical thinning bilat.    Past Medical History  Past Medical History  Diagnosis Date  . Gout   . Hypertension   . Schizophrenia   . Tobacco abuse    Past Surgical History  Past Surgical History  Procedure Laterality Date  . None     Family History  Family History  Problem Relation Age of Onset  . Hypertension Mother   . Gout Father    Social History  reports that he has been smoking Cigarettes.  He does not have any smokeless tobacco history on file. He reports that he does not drink alcohol or use illicit drugs. Allergies No Known Allergies Home medications Prior to Admission medications   Medication Sig Start Date End Date Taking? Authorizing Provider  amLODipine (NORVASC) 10 MG tablet Take 10 mg by mouth daily.   Yes Historical Provider, MD  haloperidol decanoate (HALDOL DECANOATE) 100 MG/ML injection Inject 1 mL into the muscle as directed. For 42 days 12/15/14  Yes Historical Provider, MD  predniSONE (DELTASONE) 5 MG tablet Take 1 tablet by mouth 2 (two) times daily. 01/06/15  Yes Historical Provider, MD  probenecid  (BENEMID) 500 MG tablet Take 500 mg by mouth 2 (two) times daily.   Yes Historical Provider, MD  benztropine (COGENTIN) 1 MG tablet Take 1 tablet by mouth daily. 12/15/14   Historical Provider, MD  colchicine 0.6 MG tablet 2 now and one in one hour.  Tomorrow 1 po bid Patient not taking: Reported on 01/13/2015 11/26/13   Carmelina Dane, MD  indomethacin (INDOCIN) 25 MG capsule Take 1 capsule (25 mg total) by mouth 3 (three) times daily with meals. Patient not taking: Reported on 01/13/2015 11/26/13   Carmelina Dane, MD  predniSONE (DELTASONE) 20 MG tablet 2 daily with food Patient not taking: Reported on 01/13/2015 09/21/13   Elvina Sidle, MD   Liver Function Tests  Recent Labs Lab 01/13/15 0920  AST 16  ALT 14  ALKPHOS 94  BILITOT 0.5  PROT 6.9  ALBUMIN 2.8*   No results for input(s): LIPASE, AMYLASE in the last 168 hours. CBC  Recent Labs Lab 01/13/15 0920 01/13/15 1601 01/15/15 0430 01/16/15 0514  WBC 13.6* 12.4* 11.7* 9.5  NEUTROABS 12.7*  --   --   --   HGB 9.5* 8.6* 8.2* 8.4*  HCT 29.8* 27.0* 26.1* 26.2*  MCV 101.4* 100.4* 102.0* 100.4*  PLT 274 258 248 249   Basic Metabolic Panel  Recent Labs Lab 01/13/15 0920 01/13/15 1601 01/14/15 1025 01/15/15 0430 01/16/15 0514  NA 133*  --  136 139 139  K 4.8  --  4.6 4.7 4.7  CL 102  --  108 109 105  CO2 19  --  19 20 22   GLUCOSE 191*  --  170* 102* 107*  BUN 55*  --  47* 50* 54*  CREATININE 3.34* 2.96* 2.68* 2.65* 2.75*  CALCIUM 9.0  --  8.3* 8.3* 8.4    Filed Vitals:   01/15/15 1524 01/15/15 2131 01/16/15 0521 01/16/15 0622  BP: 157/88 157/78 173/78 172/79  Pulse: 91 75 71   Temp: 97.9 F (36.6 C) 98.5 F (36.9 C) 98.3 F (36.8 C)   TempSrc: Oral Oral Oral   Resp: 16 16 16    Height:      Weight:      SpO2: 100% 100% 100%    Exam Small framed adult male no distress No rash, cyanosis or gangrene Sclera anicteric, throat clear No jvd Bibasilar fine rales, o/w clear RRR no MRG Abd soft, NTND<  no mass or ascites GU normal, foley in place No LE or UE edema Scattered tophi about the hands bilat, studding of extensor tendons on R hand Tender bilat feet, joint effusion left 1st MTP Neuro is nf, Ox 3  UA 100 prot, 0-2 rbc, 0-2 wbc CXR normal heart size EKG poss old AS MI, no other abnormalities ECHO normal LV, RV no valve dz, normal pulm art pressures    Assessment: 1. CKD stage IV - cortical thinning on US with benign UA.  No signs of HTN organ damage elsewhere (CXR, echo and EKG are basically normal) do doubt HTN'sive renal disease. He has exam evidence of significant tophaceous gout so chronic urate nephropathy is a possibility. Although the uric acid level I would expect to be a bit higher. Also consider - analgesic nephropathy, lead nephropathy 2. Anemia prob related to CKD   Plan- UPC ratio, stop all NSAID's, Pb level, Fe/TIBC, ferritin, renal CT eval for analgesic neph changes. MRA renal arteries.  Will follow. Will need renal f/u after dc.   Vinson Moselleob Dhruvi Crenshaw MD (pgr) [phone]    [phone] 01/16/2015, 7:39 PM

## 2015-01-17 ENCOUNTER — Inpatient Hospital Stay (HOSPITAL_COMMUNITY): Payer: Medicare Other

## 2015-01-17 DIAGNOSIS — N184 Chronic kidney disease, stage 4 (severe): Secondary | ICD-10-CM

## 2015-01-17 LAB — BASIC METABOLIC PANEL
Anion gap: 9 (ref 5–15)
BUN: 59 mg/dL — ABNORMAL HIGH (ref 6–23)
CALCIUM: 8.3 mg/dL — AB (ref 8.4–10.5)
CO2: 22 mmol/L (ref 19–32)
Chloride: 107 mmol/L (ref 96–112)
Creatinine, Ser: 2.77 mg/dL — ABNORMAL HIGH (ref 0.50–1.35)
GFR calc Af Amer: 26 mL/min — ABNORMAL LOW (ref >90)
GFR calc non Af Amer: 23 mL/min — ABNORMAL LOW (ref >90)
GLUCOSE: 130 mg/dL — AB (ref 70–99)
POTASSIUM: 5 mmol/L (ref 3.5–5.1)
Sodium: 138 mmol/L (ref 135–145)

## 2015-01-17 LAB — PROTEIN / CREATININE RATIO, URINE
CREATININE, URINE: 31.99 mg/dL
Protein Creatinine Ratio: 2.75 — ABNORMAL HIGH (ref 0.00–0.15)
TOTAL PROTEIN, URINE: 88 mg/dL

## 2015-01-17 MED ORDER — OXYCODONE-ACETAMINOPHEN 5-325 MG PO TABS
1.0000 | ORAL_TABLET | ORAL | Status: DC | PRN
  Administered 2015-01-17: 2 via ORAL
  Administered 2015-01-17 – 2015-01-20 (×4): 1 via ORAL
  Filled 2015-01-17 (×2): qty 1
  Filled 2015-01-17: qty 2
  Filled 2015-01-17: qty 1

## 2015-01-17 MED ORDER — REGADENOSON 0.4 MG/5ML IV SOLN
0.4000 mg | Freq: Once | INTRAVENOUS | Status: AC
  Administered 2015-01-18: 0.4 mg via INTRAVENOUS
  Filled 2015-01-17: qty 5

## 2015-01-17 NOTE — Progress Notes (Addendum)
PROGRESS NOTE  [name] CorrenteChay Wise ZOX:[id number] DOB: May 18, 1951 DOA: 01/13/2015 PCP: Ralene OkMOREIRA,ROY, MD  HPI: 64 y.o. male with past medical history of gout and essential hypertension that is brought to the emergency room by EMS. Per EMS reports the living conditions were in horrible. He relates I lateral lower extremity pain worst on the right great toe and ankle but also at the knee on the right and left knee and ankle chest progressively gotten worse over the last month. He relates he has been taking his indomethacin without any improvement, he is barely able to ambulate and he reaches crutches to move around, he also relates some urinary incontinence. Pt's denies any chest, sob or palpitation.  Subjective / 24 H Interval events No chest pain or breathing difficulties, continues to endorse gout pain  Assessment/Plan: Active Problems:   Gout flare   Leukocytosis   Hyponatremia   Macrocytic anemia   Sinus tachycardia   AKI (acute kidney injury)   Demand ischemia of myocardium   Essential hypertension   Tobacco abuse   Primary gout   Acute kidney injury   Anemia, iron deficiency   Folic acid deficiency   Acute kidney injury on underlying chronic kidney disease stage IV - this is probably related to his NSAID use, avoid further use of indomethacin, he is improving some with hydration  - Most likely he has underlying chronic kidney disease from long standing NSAID use and uncontrolled HTN - I consulted nephrology and appreciate input, looks like this is his baseline right now, will need outpatient follow-up MRA abdomen pending   HTN - on metoprolol, persistently elevated BPs, have started Amlodipine  Acute Gout flare: - DC the indomethacin, continue Prednisone - PT evaluated patient, recommending SNF, has a bed available and his discharge is pending the MRI  Leukocytosis: - Unclear etiology has remained afebrile, this could be due to to the acute renal failure and or acute gout flare. -  The patient also was on prednisone tapered which could explain his leukocytosis - improving with supportive treatment  Hyponatremia: - Likely due to dehydration, improving with IV fluids  Macrocytic anemia: - has iron deficiency and folate deficiency, start supplements   Sinus tachycardia, troponin elevation - Unclear etiology. - 2-D echo without any wall motion abnormalities, cardiology consulted, appreciate input - started ASA 325 daily - should be stable for stress test, will discuss with cardiology   History of paranoid schizophrenia -Psychiatry consulted, appreciate input   Diet: Diet Heart Fluids: NS DVT Prophylaxis: Heparin  Code Status: Full Code Family Communication: d/w patient  Disposition Plan: SNF when ready   Consultants:  Cardiology   Nephrology   Procedures:  None    Antibiotics  Anti-infectives    None       Studies  [place] Renal  01/15/2015   CLINICAL DATA:  Initial evaluation for renal failure  EXAM: RENAL/URINARY TRACT ULTRASOUND COMPLETE  COMPARISON:  Renal ultrasound 10/03/2003  FINDINGS: Right Kidney:  Length: 10.0 cm. Significant cortical thinning. Increased echogenicity. No hydronephrosis.  Left Kidney:  Length: 9.5 cm. Significant cortical thinning. Increased echogenicity. No hydronephrosis.  Bladder:  Bladder is decompressed by Foley catheter.  IMPRESSION: Similar to prior study there is significant cortical atrophy with evidence of medical renal disease but there are no acute findings.   Electronically Signed   By: Esperanza Heiraymond  Rubner M.D.   On: 01/15/2015 15:56    Objective  Filed Vitals:   01/16/15 0521 01/16/15 0622 01/16/15 2030 01/17/15 0658  BP: 173/78  172/79 143/81 148/73  Pulse: 71  74 73  Temp: 98.3 F (36.8 C)  98.4 F (36.9 C) 98.6 F (37 C)  TempSrc: Oral  Oral Oral  Resp: Height:      Weight:      SpO2: 100%  100% 100%    Intake/Output Summary (Last 24 hours) at 01/17/15 1111 Last data filed at 01/17/15  9604  Gross per 24 hour  Intake    340 ml  Output   2200 ml  Net  -1860 ml   Filed Weights   01/13/15 1452  Weight: 59.6 kg (131 lb 6.3 oz)    Exam:  General:  NAD  HEENT: no scleral icterus  Cardiovascular: RRR  Respiratory: CTA biL  Abdomen: soft, non tender  MSK/Extremities: no clubbing/cyanosis, mild bilateral knee swelling improved, very tender left toe  Skin: no rashes  Neuro: non focal   Data Reviewed: Basic Metabolic Panel:  Recent Labs Lab 01/13/15 0920 01/13/15 1601 01/14/15 1025 01/15/15 0430 01/16/15 0514 01/17/15 0540  NA 133*  --  136 139 139 138  K 4.8  --  4.6 4.7 4.7 5.0  CL 102  --  108 109 105 107  CO2 19  --  GLUCOSE 191*  --  170* 102* 107* 130*  BUN 55*  --  47* 50* 54* 59*  CREATININE 3.34* 2.96* 2.68* 2.65* 2.75* 2.77*  CALCIUM 9.0  --  8.3* 8.3* 8.4 8.3*   Liver Function Tests:  Recent Labs Lab 01/13/15 0920  AST 16  ALT 14  ALKPHOS 94  BILITOT 0.5  PROT 6.9  ALBUMIN 2.8*   CBC:  Recent Labs Lab 01/13/15 0920 01/13/15 1601 01/15/15 0430 01/16/15 0514  WBC 13.6* 12.4* 11.7* 9.5  NEUTROABS 12.7*  --   --   --   HGB 9.5* 8.6* 8.2* 8.4*  HCT 29.8* 27.0* 26.1* 26.2*  MCV 101.4* 100.4* 102.0* 100.4*  PLT 274 258 248 249   Cardiac Enzymes:  Recent Labs Lab 01/13/15 1601 01/13/15 2054 01/14/15 0241  TROPONINI 0.55* 0.77* 1.04*    Recent Results (from the past 240 hour(s))  Culture, blood (routine x 2)     Status: None (Preliminary result)   Collection Time: 01/13/15  9:23 AM  Result Value Ref Range Status   Specimen Description BLOOD RIGHT HAND  Final   Special Requests BOTTLES DRAWN AEROBIC AND ANAEROBIC  Final   Culture   Final           BLOOD CULTURE RECEIVED NO GROWTH TO DATE CULTURE WILL BE HELD FOR 5 DAYS BEFORE ISSUING A FINAL NEGATIVE REPORT Performed at [facility]    Report Status PENDING  Incomplete  Culture, blood (routine x 2)     Status: None (Preliminary result)     Collection Time: 01/13/15  9:31 AM  Result Value Ref Range Status   Specimen Description BLOOD RIGHT ANTECUBITAL  Final   Special Requests BOTTLES DRAWN AEROBIC AND ANAEROBIC   Final   Culture   Final           BLOOD CULTURE RECEIVED NO GROWTH TO DATE CULTURE WILL BE HELD FOR 5 DAYS BEFORE ISSUING A FINAL NEGATIVE REPORT Performed at [facility]    Report Status PENDING  Incomplete  Urine culture     Status: None   Collection Time: 01/13/15 11:36 AM  Result Value Ref Range Status   Specimen Description URINE, CLEAN  CATCH  Final   Special Requests NONE  Final   Colony Count NO GROWTH Performed at [facility]   Final   Culture NO GROWTH Performed at [facility]   Final   Report Status 01/14/2015 FINAL  Final     Scheduled Meds: . amLODipine  5 mg Oral Daily  . aspirin EC  325 mg Oral Daily  . benztropine  1 mg Oral Daily  . ferrous gluconate  324 mg Oral Q breakfast  . folic acid  1 mg Oral Daily  . [START ON 01/26/2015] haloperidol decanoate  100 mg Intramuscular Q6 weeks  . heparin  5,000 Units Subcutaneous 3 times per day  . metoprolol tartrate  25 mg Oral BID  . predniSONE  20 mg Oral Q breakfast  . sodium chloride  3 mL Intravenous Q12H   Continuous Infusions:    Pamella Pert, MD [facility] Pager [phone]. If 7 PM - 7 AM, please contact night-coverage at [email or web address], password [facility] 01/17/2015, 11:11 AM  LOS: 4 days

## 2015-01-17 NOTE — Progress Notes (Signed)
  [facility] Progress Note   Subjective: no complaints  Filed Vitals:   01/16/15 0521 01/16/15 0622 01/16/15 2030 01/17/15 0658  BP: 173/78 172/79 143/81 148/73  Pulse: 71  74 73  Temp: 98.3 F (36.8 C)  98.4 F (36.9 C) 98.6 F (37 C)  TempSrc: Oral  Oral Oral  Resp: 16  18 18   Height:      Weight:      SpO2: 100%  100% 100%   Exam: Small framed adult male no distress No jvd Bibasilar fine rales, o/w clear RRR no MRG Abd soft, NTND< no mass or ascites No LE or UE edema Scattered tophi about the hands bilat, studding of extensor tendons on R hand Tender bilat feet, joint effusion left 1st MTP Neuro is nf, Ox 3  UA 100 prot, 0-2 rbc, 0-2 wbc CXR normal heart size EKG poss old AS MI, no other abnormalities ECHO normal LV, RV no valve dz, normal pulm art pressures    Assessment: 1. CKD stage IV - cortical thinning on US with benign UA. No signs of HTN organ damage elsewhere (CXR, echo and EKG are basically normal) do doubt HTN'sive renal disease. He has exam evidence of significant tophaceous gout so chronic urate nephropathy is a possibility. Although the uric acid level I would expect to be a bit higher. Also consider - analgesic nephropathy, lead nephropathy 2. Anemia prob related to CKD 3. Tophacheous gout  Plan - MRA pending , no further suggestions.  We will contact pt after dc to set up appt for renal f/u in the office for CKD IV.  Will sign off, have d/w primary.     Vinson Moselleob Terrill Wauters MD  pager [phone]    cell [phone]  01/17/2015, 12:47 PM     Recent Labs Lab 01/15/15 0430 01/16/15 0514 01/17/15 0540  NA 139 139 138  K 4.7 4.7 5.0  CL 109 105 107  CO2 20 22 22   GLUCOSE 102* 107* 130*  BUN 50* 54* 59*  CREATININE 2.65* 2.75* 2.77*  CALCIUM 8.3* 8.4 8.3*    Recent Labs Lab 01/13/15 0920  AST 16  ALT 14  ALKPHOS 94  BILITOT 0.5  PROT 6.9  ALBUMIN 2.8*    Recent Labs Lab 01/13/15 0920 01/13/15 1601 01/15/15 0430  01/16/15 0514  WBC 13.6* 12.4* 11.7* 9.5  NEUTROABS 12.7*  --   --   --   HGB 9.5* 8.6* 8.2* 8.4*  HCT 29.8* 27.0* 26.1* 26.2*  MCV 101.4* 100.4* 102.0* 100.4*  PLT 274 258 248 249   . amLODipine  5 mg Oral Daily  . aspirin EC  325 mg Oral Daily  . benztropine  1 mg Oral Daily  . ferrous gluconate  324 mg Oral Q breakfast  . folic acid  1 mg Oral Daily  . [START ON 01/26/2015] haloperidol decanoate  100 mg Intramuscular Q6 weeks  . heparin  5,000 Units Subcutaneous 3 times per day  . metoprolol tartrate  25 mg Oral BID  . predniSONE  20 mg Oral Q breakfast  . sodium chloride  3 mL Intravenous Q12H     acetaminophen **OR** acetaminophen, ondansetron **OR** ondansetron (ZOFRAN) IV, oxyCODONE-acetaminophen

## 2015-01-17 NOTE — Progress Notes (Signed)
Patient is NPO until MRI today. He did have sips of water with his meds.

## 2015-01-18 ENCOUNTER — Inpatient Hospital Stay (HOSPITAL_COMMUNITY): Payer: Medicare Other

## 2015-01-18 ENCOUNTER — Ambulatory Visit (HOSPITAL_COMMUNITY)
Admission: EM | Admit: 2015-01-18 | Discharge: 2015-01-18 | Disposition: A | Discharge status: Home / Self Care | Payer: Medicare Other | Source: Home / Self Care | Attending: Internal Medicine | Admitting: Internal Medicine

## 2015-01-18 ENCOUNTER — Other Ambulatory Visit: Payer: Self-pay

## 2015-01-18 DIAGNOSIS — R7989 Other specified abnormal findings of blood chemistry: Principal | ICD-10-CM

## 2015-01-18 DIAGNOSIS — F200 Paranoid schizophrenia: Secondary | ICD-10-CM

## 2015-01-18 DIAGNOSIS — R778 Other specified abnormalities of plasma proteins: Secondary | ICD-10-CM

## 2015-01-18 LAB — CBC
HEMATOCRIT: 26.7 % — AB (ref 39.0–52.0)
Hemoglobin: 8.6 g/dL — ABNORMAL LOW (ref 13.0–17.0)
MCH: 32.6 pg (ref 26.0–34.0)
MCHC: 32.2 g/dL (ref 30.0–36.0)
MCV: 101.1 fL — AB (ref 78.0–100.0)
PLATELETS: 231 10*3/uL (ref 150–400)
RBC: 2.64 MIL/uL — ABNORMAL LOW (ref 4.22–5.81)
RDW: 14.7 % (ref 11.5–15.5)
WBC: 8.3 10*3/uL (ref 4.0–10.5)

## 2015-01-18 LAB — BASIC METABOLIC PANEL
ANION GAP: 9 (ref 5–15)
BUN: 65 mg/dL — ABNORMAL HIGH (ref 6–23)
CO2: 20 mmol/L (ref 19–32)
CREATININE: 3.06 mg/dL — AB (ref 0.50–1.35)
Calcium: 8.3 mg/dL — ABNORMAL LOW (ref 8.4–10.5)
Chloride: 107 mmol/L (ref 96–112)
GFR calc Af Amer: 23 mL/min — ABNORMAL LOW (ref >90)
GFR calc non Af Amer: 20 mL/min — ABNORMAL LOW (ref >90)
Glucose, Bld: 130 mg/dL — ABNORMAL HIGH (ref 70–99)
Potassium: 4.9 mmol/L (ref 3.5–5.1)
SODIUM: 136 mmol/L (ref 135–145)

## 2015-01-18 MED ORDER — TECHNETIUM TC 99M SESTAMIBI GENERIC - CARDIOLITE
10.0000 | Freq: Once | INTRAVENOUS | Status: AC | PRN
  Administered 2015-01-18: 10 via INTRAVENOUS

## 2015-01-18 MED ORDER — TECHNETIUM TC 99M SESTAMIBI GENERIC - CARDIOLITE
30.0000 | Freq: Once | INTRAVENOUS | Status: AC | PRN
  Administered 2015-01-18: 30 via INTRAVENOUS

## 2015-01-18 MED ORDER — REGADENOSON 0.4 MG/5ML IV SOLN
INTRAVENOUS | Status: AC
  Filled 2015-01-18: qty 5

## 2015-01-18 MED ORDER — OXYCODONE-ACETAMINOPHEN 5-325 MG PO TABS
ORAL_TABLET | ORAL | Status: AC
  Filled 2015-01-18: qty 1

## 2015-01-18 MED ORDER — SODIUM CHLORIDE 0.9 % IV SOLN
INTRAVENOUS | Status: AC
  Administered 2015-01-18: 16:00:00 via INTRAVENOUS

## 2015-01-18 NOTE — Progress Notes (Addendum)
Nuc results: IMPRESSION: 1. No reversible ischemia. Lateral and anteroseptal attenuation versus scar. 2. Normal left ventricular wall motion. 3. Left ventricular ejection fraction 60% 4. Low-risk stress test findings*.  In light of rising creatinine and absence of anginal symptoms, continue medical therapy. Wrote a nursing care order for nurse to inform patient of result. At some point might consider assessing lipid status but with acute issues including leg pain would not put statin onboard to avoid confounding the picture. This can be considered as an outpatient. Dayna Dunn PA-C

## 2015-01-18 NOTE — Progress Notes (Signed)
PROGRESS NOTE  [name] Wise ZOX:[id number] DOB: 1951/01/31 DOA: 01/13/2015 PCP: Ralene Ok, MD  HPI: 64 y.o. male with past medical history of gout and essential hypertension that is brought to the emergency room by EMS. Per EMS reports the living conditions were in horrible. He relates I lateral lower extremity pain worst on the right great toe and ankle but also at the knee on the right and left knee and ankle chest progressively gotten worse over the last month. He relates he has been taking his indomethacin without any improvement, he is barely able to ambulate and he reaches crutches to move around, he also relates some urinary incontinence. Pt's denies any chest, sob or palpitation.  Subjective / 24 H Interval events No chest pain or breathing difficulties, seen after stress test, states he feels tired  Assessment/Plan: Active Problems:   Gout flare   Leukocytosis   Hyponatremia   Macrocytic anemia   Sinus tachycardia   AKI (acute kidney injury)   Demand ischemia of myocardium   Essential hypertension   Tobacco abuse   Primary gout   Acute kidney injury   Anemia, iron deficiency   Folic acid deficiency   Paranoid schizophrenia   Acute kidney injury on underlying chronic kidney disease stage IV - this is probably related to his NSAID use, avoid further use of indomethacin - Most likely he has underlying chronic kidney disease from long standing NSAID use and uncontrolled HTN - nephrology was consulted and followed patient while hospitalized, will need close outpatient follow-up    HTN - on metoprolol, persistently elevated BPs, have started Amlodipine  Acute Gout flare: - DC the indomethacin, continue Prednisone - PT evaluated patient, recommending SNF - XR left foot as not improving  Leukocytosis: - Unclear etiology has remained afebrile, this could be due to to the acute renal failure and or acute gout flare. - The patient also was on prednisone tapered which could  explain his leukocytosis - improving with supportive treatment  Hyponatremia: - Likely due to dehydration  Macrocytic anemia: - has iron deficiency and folate deficiency, start supplements   Sinus tachycardia, troponin elevation - Unclear etiology. - 2-D echo without any wall motion abnormalities, cardiology consulted, appreciate input - started ASA 325 daily - stress test 2/14 low risk findings  History of paranoid schizophrenia -Psychiatry consulted, appreciate input   Diet: Diet Heart Fluids: NS DVT Prophylaxis: Heparin  Code Status: Full Code Family Communication: d/w patient  Disposition Plan: SNF when ready    Consultants:  Cardiology   Nephrology   Procedures:  None    Antibiotics  Anti-infectives    None       Studies  Ct Abdomen Wo Contrast  02/06/2015   CLINICAL DATA:  Stage 4 chronic kidney disease.  EXAM: CT ABDOMEN WITHOUT CONTRAST  TECHNIQUE: Multidetector CT imaging of the abdomen was performed following the standard protocol without IV contrast.  COMPARISON:  None.  FINDINGS: Lower chest: Mild cardiomegaly noted. Tiny bilateral pleural effusions versus pleural thickening.  Hepatobiliary: No mass visualized on this non-contrast exam. Gallbladder is unremarkable.  Pancreas: No mass or inflammatory process visualized on this non-contrast exam.  Spleen:  Within normal limits in size.  Adrenal Glands/Kidneys: No adrenal mass identified. Mild bilateral renal parenchymal scarring and atrophy noted. Tiny punctate calcifications could be due to nonobstructing bilateral renal calculi versus vascular calcification. No evidence of hydronephrosis.  Stomach/Bowel/Peritoneum:  Unremarkable.  Vascular/Lymphatic: No pathologically enlarged lymph nodes identified. 3.1 cm infrarenal abdominal aortic aneurysm noted. No  evidence of retroperitoneal hemorrhage.  Other:  None.  Musculoskeletal:  No suspicious bone lesions identified.  IMPRESSION: Mild bilateral renal  parenchymal scarring and atrophy. No evidence of hydronephrosis.  Tiny nonobstructing bilateral renal calculi versus vascular calcifications.  3.1 cm infrarenal abdominal aortic aneurysm. No evidence of aneurysm leak or rupture. Recommend followup by ultrasound in 3 years. This recommendation follows ACR consensus guidelines: White Paper of the ACR Incidental Findings Committee II on Vascular Findings. Alba Destine Coll Radiol 2013; 10:789-794   Electronically Signed   By: Myles Rosenthal M.D.   On: 01/17/2015 16:23   Dg Knee 1-2 Views Right  01/17/2015   CLINICAL DATA:  Right knee pain and swelling for 2-3 days. No known injury. Gout.  EXAM: RIGHT KNEE - 1-2 VIEW  COMPARISON:  None.  FINDINGS: No evidence of acute fracture or dislocation. Diffuse soft tissue swelling is seen most severe in the suprasellar region. Small knee joint effusion cannot be excluded. No evidence joint space narrowing, osteophytosis, or periarticular erosions. Peripheral vascular calcification noted.  IMPRESSION: Diffuse soft tissue swelling. Small knee joint effusion cannot be excluded. No osseous abnormality identified.   Electronically Signed   By: Myles Rosenthal M.D.   On: 01/17/2015 14:03   Nm Myocar Multi W/spect W/wall Motion / Ef  01/18/2015   CLINICAL DATA:  elevated troponins, tobacco abuse, HTN, GOUT, schizophrenia, bilateral leg pain.  EXAM: MYOCARDIAL IMAGING WITH SPECT (REST AND PHARMACOLOGIC-STRESS)  GATED LEFT VENTRICULAR WALL MOTION STUDY  LEFT VENTRICULAR EJECTION FRACTION  TECHNIQUE: Standard myocardial SPECT imaging was performed after resting intravenous injection of 10 mCi Tc-26m sestamibi. Subsequently, intravenous infusion of Lexiscan was performed under the supervision of the Cardiology staff. At peak effect of the drug, 30 mCi Tc-49m sestamibi was injected intravenously and standard myocardial SPECT imaging was performed. Quantitative gated imaging was also performed to evaluate left ventricular wall motion, and estimate  left ventricular ejection fraction.  COMPARISON:  None.  FINDINGS: Perfusion: Mildly decreased activity in the anteroseptal region of the left ventricle and lateral wall, on both rest and stress sequences. No decreased activity in the left ventricle on stress imaging to suggest reversible ischemia or infarction. There is a moderate amount of subdiaphragmatic activity noted on both sequences.  Wall Motion: Normal left ventricular wall motion. No left ventricular dilation.  Left Ventricular Ejection Fraction: 60 %  End diastolic volume 18 ml  End systolic volume 45 ml  IMPRESSION: 1. No reversible ischemia. Lateral and anteroseptal attenuation versus scar.  2. Normal left ventricular wall motion.  3. Left ventricular ejection fraction 60%  4. Low-risk stress test findings*.  *2012 Appropriate Use Criteria for Coronary Revascularization Focused Update: J Am Coll Cardiol. 2012;59(9):857-881. http://content.dementiazones.com.aspx?articleid=1201161   Electronically Signed   By: Corlis Leak M.D.   On: 01/18/2015 14:26    Objective  Filed Vitals:   01/17/15 1355 01/17/15 2053 01/18/15 0544 01/18/15 1404  BP: 137/72 134/80 166/78 141/84  Pulse: 82 79 76 99  Temp: 99 F (37.2 C) 98.3 F (36.8 C) 98.2 F (36.8 C) 98.7 F (37.1 C)  TempSrc: Oral Oral Oral Oral  Resp: Height:      Weight:      SpO2: 100% 100% 100% 100%    Intake/Output Summary (Last 24 hours) at 01/18/15 1456 Last data filed at 01/18/15 1407  Gross per 24 hour  Intake      0 ml  Output   1675 ml  Net  -1675 ml  Filed Weights   01/13/15 1452  Weight: 59.6 kg (131 lb 6.3 oz)    Exam:  General:  NAD  HEENT: no scleral icterus  Cardiovascular: RRR  Respiratory: CTA biL  Abdomen: soft, non tender  MSK/Extremities: no clubbing/cyanosis, mild bilateral knee swelling improved, very tender left toe  Skin: no rashes  Neuro: non focal   Data Reviewed: Basic Metabolic Panel:  Recent Labs Lab  01/14/15 1025 01/15/15 0430 01/16/15 0514 01/17/15 0540 01/18/15 0528  NA 136 139 139 138 136  K 4.6 4.7 4.7 5.0 4.9  CL 108 109 105 107 107  CO2 19 20 22 22 20   GLUCOSE 170* 102* 107* 130* 130*  BUN 47* 50* 54* 59* 65*  CREATININE 2.68* 2.65* 2.75* 2.77* 3.06*  CALCIUM 8.3* 8.3* 8.4 8.3* 8.3*   Liver Function Tests:  Recent Labs Lab 01/13/15 0920  AST 16  ALT 14  ALKPHOS 94  BILITOT 0.5  PROT 6.9  ALBUMIN 2.8*   CBC:  Recent Labs Lab 01/13/15 0920 01/13/15 1601 01/15/15 0430 01/16/15 0514 01/18/15 0528  WBC 13.6* 12.4* 11.7* 9.5 8.3  NEUTROABS 12.7*  --   --   --   --   HGB 9.5* 8.6* 8.2* 8.4* 8.6*  HCT 29.8* 27.0* 26.1* 26.2* 26.7*  MCV 101.4* 100.4* 102.0* 100.4* 101.1*  PLT 274 258 248 249 231   Cardiac Enzymes:  Recent Labs Lab 01/13/15 1601 01/13/15 2054 01/14/15 0241  TROPONINI 0.55* 0.77* 1.04*    Recent Results (from the past 240 hour(s))  Culture, blood (routine x 2)     Status: None (Preliminary result)   Collection Time: 01/13/15  9:23 AM  Result Value Ref Range Status   Specimen Description BLOOD RIGHT HAND  Final   Special Requests BOTTLES DRAWN AEROBIC AND ANAEROBIC  Final   Culture   Final           BLOOD CULTURE RECEIVED NO GROWTH TO DATE CULTURE WILL BE HELD FOR 5 DAYS BEFORE ISSUING A FINAL NEGATIVE REPORT Performed at [facility]    Report Status PENDING  Incomplete  Culture, blood (routine x 2)     Status: None (Preliminary result)   Collection Time: 01/13/15  9:31 AM  Result Value Ref Range Status   Specimen Description BLOOD RIGHT ANTECUBITAL  Final   Special Requests BOTTLES DRAWN AEROBIC AND ANAEROBIC   Final   Culture   Final           BLOOD CULTURE RECEIVED NO GROWTH TO DATE CULTURE WILL BE HELD FOR 5 DAYS BEFORE ISSUING A FINAL NEGATIVE REPORT Performed at [facility]    Report Status PENDING  Incomplete  Urine culture     Status: None   Collection Time: 01/13/15 11:36 AM  Result Value  Ref Range Status   Specimen Description URINE, CLEAN CATCH  Final   Special Requests NONE  Final   Colony Count NO GROWTH Performed at [facility]   Final   Culture NO GROWTH Performed at [facility]   Final   Report Status 01/14/2015 FINAL  Final     Scheduled Meds: . amLODipine  5 mg Oral Daily  . aspirin EC  325 mg Oral Daily  . benztropine  1 mg Oral Daily  . ferrous gluconate  324 mg Oral Q breakfast  . folic acid  1 mg Oral Daily  . [START ON 01/26/2015] haloperidol decanoate  100 mg Intramuscular Q6 weeks  . heparin  5,000 Units Subcutaneous 3 times per day  . metoprolol tartrate  25 mg Oral BID  . predniSONE  20 mg Oral Q breakfast  . sodium chloride  3 mL Intravenous Q12H   Continuous Infusions: . sodium chloride      Pamella Pertostin Gherghe, MD [facility] Pager [phone]. If 7 PM - 7 AM, please contact night-coverage at [email or web address], password [email or web address] 01/18/2015, 2:56 PM  LOS: 5 days

## 2015-01-18 NOTE — Progress Notes (Signed)
Patient: [name] Wise / Admit Date: 01/13/2015 / Date of Encounter: 01/18/2015, 11:20 AM   Subjective: Reports continued foot/leg pain. No CP or SOB. Denies any CP preceding recent admission. Does not exercise.   Objective: Telemetry: NSR Physical Exam: Blood pressure 166/78, pulse 76, temperature 98.2 F (36.8 C), temperature source Oral, resp. rate 18, height  (1.676 m), weight 131 lb 6.3 oz (59.6 kg), SpO2 100 %. General: Well developed, well nourished, in no acute distress. Head: Normocephalic, atraumatic, sclera non-icteric, no xanthomas, nares are without discharge. Neck: Negative for carotid bruits. JVP not elevated. Lungs: Clear bilaterally to auscultation without wheezes, rales, or rhonchi. Breathing is unlabored. Heart: RRR S1 S2 without murmurs, rubs, or gallops.  Abdomen: Soft, non-tender, non-distended with normoactive bowel sounds. No rebound/guarding. Extremities: No clubbing or cyanosis. No edema.  Neuro: Alert and oriented X 3. Moves all extremities spontaneously. Psych:  Responds to questions appropriately with a normal affect.   Intake/Output Summary (Last 24 hours) at 01/18/15 1120 Last data filed at 01/18/15 0205  Gross per 24 hour  Intake      0 ml  Output   1950 ml  Net  -1950 ml    Inpatient Medications:  . amLODipine  5 mg Oral Daily  . aspirin EC  325 mg Oral Daily  . benztropine  1 mg Oral Daily  . ferrous gluconate  324 mg Oral Q breakfast  . folic acid  1 mg Oral Daily  . [START ON 01/26/2015] haloperidol decanoate  100 mg Intramuscular Q6 weeks  . heparin  5,000 Units Subcutaneous 3 times per day  . metoprolol tartrate  25 mg Oral BID  . predniSONE  20 mg Oral Q breakfast  . regadenoson  0.4 mg Intravenous Once  . sodium chloride  3 mL Intravenous Q12H   Infusions:    Labs:  Recent Labs  01/17/15 0540 01/18/15 0528  NA 138 136  K 5.0 4.9  CL 107 107  CO2 22 20  GLUCOSE 130* 130*  BUN 59* 65*  CREATININE 2.77* 3.06*  CALCIUM 8.3*  8.3*   No results for input(s): AST, ALT, ALKPHOS, BILITOT, PROT, ALBUMIN in the last 72 hours.  Recent Labs  01/16/15 0514 01/18/15 0528  WBC 9.5 8.3  HGB 8.4* 8.6*  HCT 26.2* 26.7*  MCV 100.4* 101.1*  PLT 249 231   No results for input(s): CKTOTAL, CKMB, TROPONINI in the last 72 hours. Invalid input(s): POCBNP No results for input(s): HGBA1C in the last 72 hours.   Radiology/Studies:  Ct Abdomen Wo Contrast  01/17/2015   CLINICAL DATA:  Stage 4 chronic kidney disease.  EXAM: CT ABDOMEN WITHOUT CONTRAST  TECHNIQUE: Multidetector CT imaging of the abdomen was performed following the standard protocol without IV contrast.  COMPARISON:  None.  FINDINGS: Lower chest: Mild cardiomegaly noted. Tiny bilateral pleural effusions versus pleural thickening.  Hepatobiliary: No mass visualized on this non-contrast exam. Gallbladder is unremarkable.  Pancreas: No mass or inflammatory process visualized on this non-contrast exam.  Spleen:  Within normal limits in size.  Adrenal Glands/Kidneys: No adrenal mass identified. Mild bilateral renal parenchymal scarring and atrophy noted. Tiny punctate calcifications could be due to nonobstructing bilateral renal calculi versus vascular calcification. No evidence of hydronephrosis.  Stomach/Bowel/Peritoneum:  Unremarkable.  Vascular/Lymphatic: No pathologically enlarged lymph nodes identified. 3.1 cm infrarenal abdominal aortic aneurysm noted. No evidence of retroperitoneal hemorrhage.  Other:  None.  Musculoskeletal:  No suspicious bone lesions identified.  IMPRESSION: Mild bilateral renal parenchymal scarring and  atrophy. No evidence of hydronephrosis.  Tiny nonobstructing bilateral renal calculi versus vascular calcifications.  3.1 cm infrarenal abdominal aortic aneurysm. No evidence of aneurysm leak or rupture. Recommend followup by ultrasound in 3 years. This recommendation follows ACR consensus guidelines: White Paper of the ACR Incidental Findings Committee II  on Vascular Findings. Alba DestineJ Am Coll Radiol 2013; 10:789-794   Electronically Signed   By: Myles RosenthalJohn  Stahl M.D.   On: 01/17/2015 16:23   Dg Chest 2 View  01/13/2015   CLINICAL DATA:  Bilateral lower extremity pain  EXAM: CHEST  2 VIEW  COMPARISON:  None.  FINDINGS: Lungs are clear. Heart is upper normal in size with pulmonary vascularity within normal limits. No adenopathy. No bone lesions.  IMPRESSION: No edema or consolidation.   Electronically Signed   By: Bretta BangWilliam  Woodruff III M.D.   On: 01/13/2015 09:57   Dg Knee 1-2 Views Right  01/17/2015   CLINICAL DATA:  Right knee pain and swelling for 2-3 days. No known injury. Gout.  EXAM: RIGHT KNEE - 1-2 VIEW  COMPARISON:  None.  FINDINGS: No evidence of acute fracture or dislocation. Diffuse soft tissue swelling is seen most severe in the suprasellar region. Small knee joint effusion cannot be excluded. No evidence joint space narrowing, osteophytosis, or periarticular erosions. Peripheral vascular calcification noted.  IMPRESSION: Diffuse soft tissue swelling. Small knee joint effusion cannot be excluded. No osseous abnormality identified.   Electronically Signed   By: Myles RosenthalJohn  Stahl M.D.   On: 01/17/2015 14:03   [place] Renal  01/15/2015   CLINICAL DATA:  Initial evaluation for renal failure  EXAM: RENAL/URINARY TRACT ULTRASOUND COMPLETE  COMPARISON:  Renal ultrasound 10/03/2003  FINDINGS: Right Kidney:  Length: 10.0 cm. Significant cortical thinning. Increased echogenicity. No hydronephrosis.  Left Kidney:  Length: 9.5 cm. Significant cortical thinning. Increased echogenicity. No hydronephrosis.  Bladder:  Bladder is decompressed by Foley catheter.  IMPRESSION: Similar to prior study there is significant cortical atrophy with evidence of medical renal disease but there are no acute findings.   Electronically Signed   By: Esperanza Heiraymond  Rubner M.D.   On: 01/15/2015 15:56     Assessment and Plan  1. AKI on CKD stage IV in setting of recent NSAID use 2. Acute gout flare 3.  Leukocytosis 4. Hyponatremia 5. Macrocytic anemia with IDA and folate deficiency, negative FOBT 6. Sinus tachycardia and mild troponin evaluation 7. HTN 8. H/o paranoid schizophrenia 9. Tobacco abuse  Lexiscan nuc completed without complication with assistance of translator. 2D Echo EF 55-60%, grade 1 DD, mild MR. Await nuclear stress test results for risk stratification. He has not had any recent anginal symptoms. Would likely favor continued medical therapy as he is not a  good candidate for cath given ongoing renal insufficiency. Further tx of leg pain per IM. Dr. Donnie Ahoilley saw the patient down in nuc with me.  Signed, Ronie Spiesayna Dunn PA-C   Patient seen in nuclear medicine.  No c/o chest pain or SOB>  COmmunication is difficult even with interpretor.   No changes.  Lungs clear, No S3  W. Viann FishSpencer Tilley, Montez HagemanJr. MD [facility] HospitalFACC

## 2015-01-19 ENCOUNTER — Inpatient Hospital Stay (HOSPITAL_COMMUNITY): Payer: Medicare Other

## 2015-01-19 DIAGNOSIS — R509 Fever, unspecified: Secondary | ICD-10-CM

## 2015-01-19 LAB — URINALYSIS, ROUTINE W REFLEX MICROSCOPIC
Bilirubin Urine: NEGATIVE
Glucose, UA: NEGATIVE mg/dL
Ketones, ur: NEGATIVE mg/dL
Leukocytes, UA: NEGATIVE
Nitrite: NEGATIVE
Protein, ur: 100 mg/dL — AB
Specific Gravity, Urine: 1.01 (ref 1.005–1.030)
Urobilinogen, UA: 0.2 mg/dL (ref 0.0–1.0)
pH: 6 (ref 5.0–8.0)

## 2015-01-19 LAB — CULTURE, BLOOD (ROUTINE X 2)
Culture: NO GROWTH
Culture: NO GROWTH

## 2015-01-19 LAB — BASIC METABOLIC PANEL
ANION GAP: 9 (ref 5–15)
BUN: 61 mg/dL — AB (ref 6–23)
CO2: 18 mmol/L — ABNORMAL LOW (ref 19–32)
Calcium: 8.2 mg/dL — ABNORMAL LOW (ref 8.4–10.5)
Chloride: 108 mmol/L (ref 96–112)
Creatinine, Ser: 3.16 mg/dL — ABNORMAL HIGH (ref 0.50–1.35)
GFR calc Af Amer: 23 mL/min — ABNORMAL LOW (ref >90)
GFR, EST NON AFRICAN AMERICAN: 19 mL/min — AB (ref >90)
GLUCOSE: 157 mg/dL — AB (ref 70–99)
Potassium: 5.3 mmol/L — ABNORMAL HIGH (ref 3.5–5.1)
Sodium: 135 mmol/L (ref 135–145)

## 2015-01-19 LAB — URINE MICROSCOPIC-ADD ON

## 2015-01-19 LAB — LEAD, BLOOD (ADULT >= 16 YRS): Lead-Whole Blood: NOT DETECTED ug/dL (ref 0–19)

## 2015-01-19 MED ORDER — ACETAMINOPHEN 500 MG PO TABS
1000.0000 mg | ORAL_TABLET | Freq: Once | ORAL | Status: AC
  Administered 2015-01-19: 1000 mg via ORAL
  Filled 2015-01-19: qty 2

## 2015-01-19 MED ORDER — SODIUM POLYSTYRENE SULFONATE 15 GM/60ML PO SUSP
15.0000 g | Freq: Once | ORAL | Status: AC
  Administered 2015-01-19: 15 g via ORAL
  Filled 2015-01-19: qty 60

## 2015-01-19 MED ORDER — SODIUM CHLORIDE 0.9 % IV BOLUS (SEPSIS)
500.0000 mL | Freq: Once | INTRAVENOUS | Status: AC
  Administered 2015-01-19: 500 mL via INTRAVENOUS

## 2015-01-19 NOTE — Progress Notes (Signed)
CARE MANAGEMENT NOTE 01/19/2015  Patient:  Wise,[name]   Account Number:  [id number]  Date Initiated:  01/13/2015  Documentation initiated by:  Trinna BalloonMcGIBBONEY,COOKIE [name]  Subjective/Objective Assessment:   pt admitted with AKI, LE pain/from [place]ian     Action/Plan:   from home   Anticipated DC Date:  01/20/2015   Anticipated DC Plan:  SKILLED NURSING FACILITY  In-house referral  Clinical Social Worker      DC Planning Services  CM consult      Choice offered to / List presented to:             Status of service:   Medicare Important Message given?  YES (If response is "NO", the following Medicare IM given date fields will be blank) Date Medicare IM given:  01/16/2015 Medicare IM given by:  Trinna BalloonMcGIBBONEY,COOKIE Alakai Macbride Date Additional Medicare IM given:   Additional Medicare IM given by:    Discharge Disposition:  SKILLED NURSING FACILITY  Per UR Regulation:  Reviewed for med. necessity/level of care/duration of stay  If discussed at Long Length of Stay Meetings, dates discussed:    Comments:  01/19/15 MMcGibboney, RN, BSN Discharge plan to SNF. CSW following for D/C.   01/16/15 MMcGibboney, RN, BSN Pt discharging to SNF. CSW following.   01/13/15 MMcGibboney, RN, BSN Pt speak little English is [place].

## 2015-01-19 NOTE — Progress Notes (Signed)
Physical Therapy Treatment Patient Details Name: [name] Wise MRN: [id number] DOB: 1951/05/23 Today's Date: 01/19/2015    History of Present Illness 64 yo male admitted with gout flare. Pt is [place].     PT Comments    Pt not wanting to do a lot, however participated with supine exercises and helping ambulate to the bathroom and back. Pt very weak, and noted atrophy in UES and LEs distally. Very weak B hand grip and very weak B ankle AROM. Pt stated they have been getting weaker and weaker. Tolerated session today.   Follow Up Recommendations  SNF     Equipment Recommendations  Rolling walker with 5" wheels    Recommendations for Other Services       Precautions / Restrictions Precautions Precautions: Fall    Mobility  Bed Mobility Overal bed mobility: Needs Assistance Bed Mobility: Supine to Sit;Sit to Supine     Supine to sit: HOB elevated;Min assist (pt wanted to elevate the bed greater than 60 degrees to assist him with supine to sit) Sit to supine: Min assist (head of bed flat and use of rail)      Transfers Overall transfer level: Needs assistance Equipment used: Rolling walker (2 wheeled) Transfers: Sit to/from Stand Sit to Stand: Mod assist         General transfer comment: modA to rise off bed and off toilet especially when low surface/toilet.   Ambulation/Gait Ambulation/Gait assistance: Min assist Ambulation Distance (Feet): 15 Feet (performed twice, to toilet then back ) Assistive device: Rolling walker (2 wheeled) Gait Pattern/deviations: Step-through pattern     General Gait Details: slow , small steps. needed guidance with RW and assist to get there. Very weak.   Stairs            Wheelchair Mobility    Modified Rankin (Stroke Patients Only)       Balance                                    Cognition Arousal/Alertness: Awake/alert Behavior During Therapy: WFL for tasks assessed/performed Overall Cognitive  Status: Difficult to assess                      Exercises General Exercises - Lower Extremity Ankle Circles/Pumps: AAROM;Both;10 reps Heel Slides: AAROM;Both;10 reps Hip ABduction/ADduction:  (tried but pt reported too painful in knees) Straight Leg Raises: AAROM;Both;10 reps    General Comments        Pertinent Vitals/Pain Faces Pain Scale: Hurts little more Pain Location: pointed to knees when exercises perfromed but stated felt a little better after session after we moved a bit Pain Intervention(s): Monitored during session    Home Living                      Prior Function            PT Goals (current goals can now be found in the care plan section) Acute Rehab PT Goals Patient Stated Goal: none stated PT Goal Formulation: Patient unable to participate in goal setting Time For Goal Achievement: 01/29/15 Potential to Achieve Goals: Good Progress towards PT goals: Progressing toward goals    Frequency  Min 3X/week    PT Plan Current plan remains appropriate    Co-evaluation             End of Session Equipment Utilized During Treatment: Gait  belt Activity Tolerance: Patient tolerated treatment well Patient left: in bed;with call bell/phone within reach;with bed alarm set     Time: 1130-1158 PT Time Calculation (min) (ACUTE ONLY): 28 min  Charges:  $Therapeutic Exercise: 8-22 mins $Therapeutic Activity: 8-22 mins                    G Codes:      Marella BileBRITT, [name] Wise 01/19/2015, 12:12 PM  Marella BileSharron Adaijah Wise, PT Pager: [phone] 01/19/2015

## 2015-01-19 NOTE — Consult Note (Signed)
[name] CorningPeter Dalldorf, MD           [name] FlorenceAndrew Maylene Crocker, PA-C  [facility]   [address], [place], [place]  [id number]                     [phone]   ORTHOPAEDIC CONSULTATION  [name] CorrenteChay Wise            MRN:  [id number] DOB/SEX:  Jul 06, 1951/male    REQUESTING PHYSICIAN:    CHIEF COMPLAINT:  Painful bilateral knee right greater than left  HISTORY: [name] MamChay Wise a 64 y.o. male with right greater than left knee pain. He speaks limited english but clearly states that his knee has been bothering him for the past one month. He states that it becomes swollen on him. He was laying in bed in the fetal position with his knees bent up when I entered the room the. While talking to him he straightened out his legs and was laying prone. He grimaced while doing this but was able to move it. He denies any recent injury to his knees. Orthopaedics was consulted due to overnight fever and concern for septic knee.       PAST MEDICAL HISTORY: Patient Active Problem List   Diagnosis Date Noted  . Paranoid schizophrenia 01/18/2015  . Anemia, iron deficiency   . Folic acid deficiency   . Primary gout   . Acute kidney injury   . Demand ischemia of myocardium   . Essential hypertension   . Tobacco abuse   . Gout flare 01/13/2015  . Leukocytosis 01/13/2015  . Hyponatremia 01/13/2015  . Macrocytic anemia 01/13/2015  . Sinus tachycardia 01/13/2015  . AKI (acute kidney injury) 01/13/2015   Past Medical History  Diagnosis Date  . Gout   . Hypertension   . Schizophrenia   . Tobacco abuse    Past Surgical History  Procedure Laterality Date  . None       MEDICATIONS:   Current facility-administered medications:  .  acetaminophen (TYLENOL) tablet 650 mg, 650 mg, Oral, Q6H PRN, 650 mg at 01/18/15 2119 **OR** acetaminophen (TYLENOL) suppository 650 mg, 650 mg, Rectal, Q6H PRN, Marinda ElkAbraham Feliz Ortiz, MD, 650 mg at 01/18/15 0547 .  amLODipine (NORVASC) tablet 5 mg, 5 mg, Oral, Daily, Leatha Gildingostin M  Gherghe, MD, 5 mg at 01/18/15 1532 .  aspirin EC tablet 325 mg, 325 mg, Oral, Daily, Kathleene Hazelhristopher D McAlhany, MD, 325 mg at 01/18/15 1532 .  benztropine (COGENTIN) tablet 1 mg, 1 mg, Oral, Daily, Marinda ElkAbraham Feliz Ortiz, MD, 1 mg at 01/18/15 1531 .  ferrous gluconate (FERGON) tablet 324 mg, 324 mg, Oral, Q breakfast, Leatha Gildingostin M Gherghe, MD, 324 mg at 01/18/15 0858 .  folic acid (FOLVITE) tablet 1 mg, 1 mg, Oral, Daily, Leatha Gildingostin M Gherghe, MD, 1 mg at 01/18/15 1532 .  [START ON 01/26/2015] haloperidol decanoate (HALDOL DECANOATE) 100 MG/ML injection 100 mg, 100 mg, Intramuscular, Q6 weeks, Marinda ElkAbraham Feliz Ortiz, MD .  heparin injection 5,000 Units, 5,000 Units, Subcutaneous, 3 times per day, Marinda ElkAbraham Feliz Ortiz, MD, 5,000 Units at 01/19/15 [phone] .  metoprolol tartrate (LOPRESSOR) tablet 25 mg, 25 mg, Oral, BID, Marinda ElkAbraham Feliz Ortiz, MD, 25 mg at 01/18/15 2119 .  ondansetron (ZOFRAN) tablet 4 mg, 4 mg, Oral, Q6H PRN **OR** ondansetron (ZOFRAN) injection 4 mg, 4 mg, Intravenous, Q6H PRN, Marinda ElkAbraham Feliz Ortiz, MD .  oxyCODONE-acetaminophen (PERCOCET/ROXICET) 5-325 MG per tablet 1-2 tablet, 1-2 tablet, Oral, Q4H PRN, Costin Otelia SergeantM Gherghe, MD, 1 tablet at  01/18/15 1054 .  predniSONE (DELTASONE) tablet 20 mg, 20 mg, Oral, Q breakfast, Leatha Gilding, MD, 20 mg at 01/18/15 0858 .  sodium chloride 0.9 % injection 3 mL, 3 mL, Intravenous, Q12H, Marinda Elk, MD, 3 mL at 01/17/15 2116  ALLERGIES:  No Known Allergies  REVIEW OF SYSTEMS: REVIEWED IN DETAIL IN CHART  FAMILY HISTORY:   Family History  Problem Relation Age of Onset  . Hypertension Mother   . Gout Father     SOCIAL HISTORY:   History  Substance Use Topics  . Smoking status: Current Every Day Smoker    Types: Cigarettes  . Smokeless tobacco: Not on file  . Alcohol Use: No     EXAMINATION: Vital signs in last 24 hours: Temp:  [98.7 F (37.1 C)-102.3 F (39.1 C)] 98.7 F (37.1 C) (02/15 0447) Pulse Rate:  [77-107] 77 (02/15 0447) Resp:   [16-18] 18 (02/15 0447) BP: (105-161)/(63-92) 105/63 mmHg (02/15 0447) SpO2:  [100 %] 100 % (02/15 0447)  Head: Normocephalic, without obvious abnormality, atraumatic Eyes: negative findings: lids and lashes normal and conjunctivae and sclerae normal Lungs: normal unlabored breathing. Skin: Skin color, texture, turgor normal. No rashes or lesions  Musculoskeletal Exam: Bilateral knee motion is 0-100 degrees. This is painful for him but he is able to move them on his own. He has a trace effusion bilaterally but no significant swelling. No erythema, ecchymosis, or signs of infection. He is mildly tender to palpation diffusely about his knees. Both calves are soft and non tender. He is neurovascularly intact distally bilaterally.      DIAGNOSTIC STUDIES: Recent laboratory studies:  Recent Labs  01/13/15 0920 01/13/15 1601 01/15/15 0430 01/16/15 0514 01/18/15 0528  WBC 13.6* 12.4* 11.7* 9.5 8.3  HGB 9.5* 8.6* 8.2* 8.4* 8.6*  HCT 29.8* 27.0* 26.1* 26.2* 26.7*  PLT 274 258 248 249 231    Recent Labs  01/13/15 0920 01/13/15 1601 01/14/15 1025 01/15/15 0430 01/16/15 0514 01/17/15 0540 01/18/15 0528 01/19/15 0124  NA 133*  --  136 139 139 138 136 135  K 4.8  --  4.6 4.7 4.7 5.0 4.9 5.3*  CL 102  --  108 109 105 107 107 108  CO2 19  --  18*  BUN 55*  --  47* 50* 54* 59* 65* 61*  CREATININE 3.34* 2.96* 2.68* 2.65* 2.75* 2.77* 3.06* 3.16*  GLUCOSE 191*  --  170* 102* 107* 130* 130* 157*  CALCIUM 9.0  --  8.3* 8.3* 8.4 8.3* 8.3* 8.2*   No results found for: INR, PROTIME   Recent Radiographic Studies :  Ct Abdomen Wo Contrast  01/17/2015   CLINICAL DATA:  Stage 4 chronic kidney disease.  EXAM: CT ABDOMEN WITHOUT CONTRAST  TECHNIQUE: Multidetector CT imaging of the abdomen was performed following the standard protocol without IV contrast.  COMPARISON:  None.  FINDINGS: Lower chest: Mild cardiomegaly noted. Tiny bilateral pleural effusions versus pleural thickening.   Hepatobiliary: No mass visualized on this non-contrast exam. Gallbladder is unremarkable.  Pancreas: No mass or inflammatory process visualized on this non-contrast exam.  Spleen:  Within normal limits in size.  Adrenal Glands/Kidneys: No adrenal mass identified. Mild bilateral renal parenchymal scarring and atrophy noted. Tiny punctate calcifications could be due to nonobstructing bilateral renal calculi versus vascular calcification. No evidence of hydronephrosis.  Stomach/Bowel/Peritoneum:  Unremarkable.  Vascular/Lymphatic: No pathologically enlarged lymph nodes identified. 3.1 cm infrarenal abdominal aortic aneurysm noted. No evidence of retroperitoneal  hemorrhage.  Other:  None.  Musculoskeletal:  No suspicious bone lesions identified.  IMPRESSION: Mild bilateral renal parenchymal scarring and atrophy. No evidence of hydronephrosis.  Tiny nonobstructing bilateral renal calculi versus vascular calcifications.  3.1 cm infrarenal abdominal aortic aneurysm. No evidence of aneurysm leak or rupture. Recommend followup by ultrasound in 3 years. This recommendation follows ACR consensus guidelines: White Paper of the ACR Incidental Findings Committee II on Vascular Findings. [place] Coll Radiol 2013; 10:789-794   Electronically Signed   By: Myles Rosenthal M.D.   On: 01/17/2015 16:23   Dg Chest 2 View  01/13/2015   CLINICAL DATA:  Bilateral lower extremity pain  EXAM: CHEST  2 VIEW  COMPARISON:  None.  FINDINGS: Lungs are clear. Heart is upper normal in size with pulmonary vascularity within normal limits. No adenopathy. No bone lesions.  IMPRESSION: No edema or consolidation.   Electronically Signed   By: Bretta Bang III M.D.   On: 01/13/2015 09:57   Dg Knee 1-2 Views Right  01/17/2015   CLINICAL DATA:  Right knee pain and swelling for 2-3 days. No known injury. Gout.  EXAM: RIGHT KNEE - 1-2 VIEW  COMPARISON:  None.  FINDINGS: No evidence of acute fracture or dislocation. Diffuse soft tissue swelling is seen most  severe in the suprasellar region. Small knee joint effusion cannot be excluded. No evidence joint space narrowing, osteophytosis, or periarticular erosions. Peripheral vascular calcification noted.  IMPRESSION: Diffuse soft tissue swelling. Small knee joint effusion cannot be excluded. No osseous abnormality identified.   Electronically Signed   By: Myles Rosenthal M.D.   On: 01/17/2015 14:03   Mr Maxine Glenn Abdomen Wo Contrast  01/19/2015   CLINICAL DATA:  Unexplained renal failure. Evaluate for renal artery stenosis. Stage 4 kidney disease with cortical thinning.  EXAM: MRA ABDOMEN WITHOUT CONTRAST  TECHNIQUE: Angiographic images of the chest were obtained using MRA technique without intravenous contrast.  CONTRAST:  None  COMPARISON:  CT abdomen pelvis - 01/17/2015 ; renal ultrasound - 01/15/2015  FINDINGS: The examination is degraded secondary to lack of intravenous contrast due to patient's renal insufficiency. Examination is further degraded secondary to patient's overlying left upper extremity  Vascular Findings:  Abdominal aorta: The abdominal aorta is suboptimally evaluated on this noncontrast examination. The external caliber of the abdominal aorta is unchanged, measuring approximately 3.0 cm in maximal oblique axial dimension (image 31, series 3).  Celiac artery: There is a minimal amount of eccentric atherosclerotic plaque involving the origin of the celiac artery, not definitely resulting in a hemodynamically significant stenosis. Conventional branching pattern.  SMA: There is a minimal amount of eccentric atherosclerotic plaque involving the origin of the SMA, not definitely resulting in a hemodynamically significant stenosis. The mid and distal aspects of the main trunk of the SMA are suboptimally evaluated.  Right Renal artery: Suboptimally evaluated. There is a apparent minimal amount of focal atherosclerotic plaque involving the origin of the right renal artery though evaluation for hemodynamic  significance is degraded secondary to suboptimal vessel enhancement.  Left Renal artery: There is nondiagnostic evaluation of the left renal artery secondary to suboptimal vessel opacification.  IMA: Not evaluated  Review of the MIP images confirms the above findings.   --------------------------------------------------------------------------------  Nonvascular Findings:  Bilateral renal cortical atrophy, left greater than right, appears grossly unchanged. No urinary obstruction or perinephric stranding.  Normal hepatic contour. Normal noncontrast appearance of the gallbladder. Normal noncontrast appearance of the bilateral adrenal glands, pancreas and spleen. Visualized loops of  bowel appear normal. No retroperitoneal or mesenteric adenopathy within the imaged upper abdomen.  IMPRESSION: 1. Markedly degraded examination with nondiagnostic evaluation of the left renal artery. There is eccentric atherosclerotic plaque involving the origin of the right renal artery though evaluation for hemodynamic significance is degraded secondary to suboptimal opacification of the downstream right renal artery. If renal artery stenosis remains of clinical concern, further evaluation could be performed with Renal artery duplex ultrasound which if abnormal could be confirmed with a CO2 angiogram. 2. Grossly unchanged approximately 3.0 cm infrarenal abdominal aortic aneurysm.   Electronically Signed   By: Simonne Come M.D.   On: 01/19/2015 08:09   US Renal  01/15/2015   CLINICAL DATA:  Initial evaluation for renal failure  EXAM: RENAL/URINARY TRACT ULTRASOUND COMPLETE  COMPARISON:  Renal ultrasound 10/03/2003  FINDINGS: Right Kidney:  Length: 10.0 cm. Significant cortical thinning. Increased echogenicity. No hydronephrosis.  Left Kidney:  Length: 9.5 cm. Significant cortical thinning. Increased echogenicity. No hydronephrosis.  Bladder:  Bladder is decompressed by Foley catheter.  IMPRESSION: Similar to prior study there is  significant cortical atrophy with evidence of medical renal disease but there are no acute findings.   Electronically Signed   By: Esperanza Heir M.D.   On: 01/15/2015 15:56   Nm Myocar Multi W/spect W/wall Motion / Ef  01/18/2015   CLINICAL DATA:  elevated troponins, tobacco abuse, HTN, GOUT, schizophrenia, bilateral leg pain.  EXAM: MYOCARDIAL IMAGING WITH SPECT (REST AND PHARMACOLOGIC-STRESS)  GATED LEFT VENTRICULAR WALL MOTION STUDY  LEFT VENTRICULAR EJECTION FRACTION  TECHNIQUE: Standard myocardial SPECT imaging was performed after resting intravenous injection of 10 mCi Tc-62m sestamibi. Subsequently, intravenous infusion of Lexiscan was performed under the supervision of the Cardiology staff. At peak effect of the drug, 30 mCi Tc-71m sestamibi was injected intravenously and standard myocardial SPECT imaging was performed. Quantitative gated imaging was also performed to evaluate left ventricular wall motion, and estimate left ventricular ejection fraction.  COMPARISON:  None.  FINDINGS: Perfusion: Mildly decreased activity in the anteroseptal region of the left ventricle and lateral wall, on both rest and stress sequences. No decreased activity in the left ventricle on stress imaging to suggest reversible ischemia or infarction. There is a moderate amount of subdiaphragmatic activity noted on both sequences.  Wall Motion: Normal left ventricular wall motion. No left ventricular dilation.  Left Ventricular Ejection Fraction: 60 %  End diastolic volume 18 ml  End systolic volume 45 ml  IMPRESSION: 1. No reversible ischemia. Lateral and anteroseptal attenuation versus scar.  2. Normal left ventricular wall motion.  3. Left ventricular ejection fraction 60%  4. Low-risk stress test findings*.  *2012 Appropriate Use Criteria for Coronary Revascularization Focused Update: J Am Coll Cardiol. 2012;59(9):857-881. http://content.dementiazones.com.aspx?articleid=1201161   Electronically Signed   By: Corlis Leak  M.D.   On: 01/18/2015 14:26   Dg Chest Port 1 View  01/19/2015   CLINICAL DATA:  Sudden onset of fever.  EXAM: PORTABLE CHEST - 1 VIEW  COMPARISON:  01/13/2015  FINDINGS: A single AP portable view of the chest demonstrates no focal airspace consolidation or alveolar edema. The lungs are grossly clear. There is no large effusion or pneumothorax. Cardiac and mediastinal contours appear unremarkable.  There is no significant interval change.  IMPRESSION: No acute finding   Electronically Signed   By: Ellery Plunk M.D.   On: 01/19/2015 01:37   Dg Foot 2 Views Left  01/18/2015   CLINICAL DATA:  Diffuse pain. History of gout. No history of  trauma  EXAM: LEFT FOOT - 2 VIEW  COMPARISON:  None.  FINDINGS: Frontal and lateral views were obtained. There is no demonstrable fracture or dislocation. There is mild erosive change in the first MTP joint. There is no other erosive change. There is soft tissue swelling dorsally. Other joint spaces appear intact. There is a minimal inferior calcaneal spur.  IMPRESSION: Erosive change first MTP joint. Suspect gout. No fracture or dislocation. Soft tissue swelling dorsally.   Electronically Signed   By: Bretta Bang III M.D.   On: 01/18/2015 17:15    ASSESSMENT: Bilateral painful knees right greater than left.  PLAN: After discussing with and examining the patient I do not believe that he has a septic knee. He appears in discomfort with moving his knees but is able to straighten them completely and bend them up past 90 degrees. This is most likely a flare up of gout or arthritis. Since he is currently afebrile and does not have an elevated WBC and the fact the his WBC has been decreasing since his admission I do not think he would benefit from a knee aspiration. We would be more than happy to have him follow up with [place] on an outpatient basis. If there is any further questions or concerns please feel free to contact us.    Merlin Ege, Ginger Organ  01/19/2015, 9:53 AM

## 2015-01-19 NOTE — Progress Notes (Signed)
Pt running fever and was given tylenol, but did not respond tylenol. Paged hospitalist who ordered UA, CXR, bolus, and to monitor patient x 1 hr for fever and report back with temp. Will continue to monitor.

## 2015-01-19 NOTE — Progress Notes (Signed)
PROGRESS NOTE  [name] Wise HQI:[id number] DOB: December 03, 1951 DOA: 01/13/2015 PCP: Ralene Ok, MD  HPI: 64 y.o. male with past medical history of gout and essential hypertension that is brought to the emergency room by EMS. Per EMS reports the living conditions were in horrible. He relates I lateral lower extremity pain worst on the right great toe and ankle but also at the knee on the right and left knee and ankle chest progressively gotten worse over the last month. He relates he has been taking his indomethacin without any improvement, he is barely able to ambulate and he reaches crutches to move around, he also relates some urinary incontinence. Pt's denies any chest, sob or palpitation.  On admission patient was started on IV steroids for his gout. A troponin was checked in the emergency room, and was found to be elevated at 0.55 and slowly increasing to 1. Patient without any chest pain. Cardiology was consulted and patient underwent a stress test on 2/14 Which was essentially normal. His renal function initially improved, however it continued to get worse, and nephrology was consulted and have evaluated patient, he will need close outpatient follow-up. Patient was febrile 2/14-2/13, and out of concern for septic arthritis given knee swelling orthopedic surgery was consulted.  Subjective / 24 H Interval events - ongoing knee and toe pain - noted fever overnight, cultures sent  Assessment/Plan: Active Problems:   Gout flare   Leukocytosis   Hyponatremia   Macrocytic anemia   Sinus tachycardia   AKI (acute kidney injury)   Demand ischemia of myocardium   Essential hypertension   Tobacco abuse   Primary gout   Acute kidney injury   Anemia, iron deficiency   Folic acid deficiency   Paranoid schizophrenia   Fever 2/14 - 2/15 nights - unclear etiology, urinalysis without any evidence of infection,  And chest x-ray without airspace disease. Blood cultures were sent, still pending. He is  afebrile this morning and has no respiratory/GI symptoms. His only complaint is his knee pain and left toe pain. I have consulted orthopedic surgery to evaluate his joint complaints, I doubt this is septic arthritis but may benefit from aspiration, defer to orthopedic surgery final decision. Fever can also be caused by gout alone as well. He has no leukocytosis.  Acute kidney injury on underlying chronic kidney disease stage IV - this is probably related to his NSAID use, avoid further use of indomethacin - Most likely he has underlying chronic kidney disease from long standing NSAID use and uncontrolled HTN - nephrology was consulted, signed off - patient's renal function has slowly worsened over the last couple of days, and he is hyperkalemic today, we'll provide Kayexalate and closely monitor. If it continues to get worse and potassium levels continue to be a problem, may need to consult nephrology again.    HTN - on metoprolol, persistently elevated BPs, have started Amlodipine, better  Acute Gout flare: - DC the indomethacin, continue Prednisone, he has had some clinical improvement although still complains of tenderness.  - PT evaluated patient, recommending SNF  Leukocytosis: - Unclear etiology has remained afebrile, this could be due to to the acute renal failure and or acute gout flare. - The patient also was on prednisone tapered which could explain his leukocytosis - improved  Hyponatremia: - Likely due to dehydration  Macrocytic anemia: - has iron deficiency and folate deficiency, start supplements   Sinus tachycardia, troponin elevation - Unclear etiology. - 2-D echo without any wall motion abnormalities, cardiology  consulted, appreciate input - started ASA 325 daily - stress test 2/14 low risk findings  History of paranoid schizophrenia -Psychiatry consulted, appreciate input   Diet: Diet Heart Fluids: NS DVT Prophylaxis: Heparin  Code Status: Full Code Family  Communication: d/w patient  Disposition Plan: SNF when ready    Consultants:  Cardiology   Nephrology   Orthopedic surgery  Procedures:  None    Antibiotics  Anti-infectives    None       Studies  Ct Abdomen Wo Contrast  02-06-15   CLINICAL DATA:  Stage 4 chronic kidney disease.  EXAM: CT ABDOMEN WITHOUT CONTRAST  TECHNIQUE: Multidetector CT imaging of the abdomen was performed following the standard protocol without IV contrast.  COMPARISON:  None.  FINDINGS: Lower chest: Mild cardiomegaly noted. Tiny bilateral pleural effusions versus pleural thickening.  Hepatobiliary: No mass visualized on this non-contrast exam. Gallbladder is unremarkable.  Pancreas: No mass or inflammatory process visualized on this non-contrast exam.  Spleen:  Within normal limits in size.  Adrenal Glands/Kidneys: No adrenal mass identified. Mild bilateral renal parenchymal scarring and atrophy noted. Tiny punctate calcifications could be due to nonobstructing bilateral renal calculi versus vascular calcification. No evidence of hydronephrosis.  Stomach/Bowel/Peritoneum:  Unremarkable.  Vascular/Lymphatic: No pathologically enlarged lymph nodes identified. 3.1 cm infrarenal abdominal aortic aneurysm noted. No evidence of retroperitoneal hemorrhage.  Other:  None.  Musculoskeletal:  No suspicious bone lesions identified.  IMPRESSION: Mild bilateral renal parenchymal scarring and atrophy. No evidence of hydronephrosis.  Tiny nonobstructing bilateral renal calculi versus vascular calcifications.  3.1 cm infrarenal abdominal aortic aneurysm. No evidence of aneurysm leak or rupture. Recommend followup by ultrasound in 3 years. This recommendation follows ACR consensus guidelines: White Paper of the ACR Incidental Findings Committee II on Vascular Findings. [place] Coll Radiol 2013; 10:789-794   Electronically Signed   By: Myles Rosenthal M.D.   On: 02/06/2015 16:23   Dg Knee 1-2 Views Right  02/06/2015   CLINICAL DATA:   Right knee pain and swelling for 2-3 days. No known injury. Gout.  EXAM: RIGHT KNEE - 1-2 VIEW  COMPARISON:  None.  FINDINGS: No evidence of acute fracture or dislocation. Diffuse soft tissue swelling is seen most severe in the suprasellar region. Small knee joint effusion cannot be excluded. No evidence joint space narrowing, osteophytosis, or periarticular erosions. Peripheral vascular calcification noted.  IMPRESSION: Diffuse soft tissue swelling. Small knee joint effusion cannot be excluded. No osseous abnormality identified.   Electronically Signed   By: Myles Rosenthal M.D.   On: 2015/02/06 14:03   Mr Maxine Glenn Abdomen Wo Contrast  01/19/2015   CLINICAL DATA:  Unexplained renal failure. Evaluate for renal artery stenosis. Stage 4 kidney disease with cortical thinning.  EXAM: MRA ABDOMEN WITHOUT CONTRAST  TECHNIQUE: Angiographic images of the chest were obtained using MRA technique without intravenous contrast.  CONTRAST:  None  COMPARISON:  CT abdomen pelvis - 02/06/15 ; renal ultrasound - 01/15/2015  FINDINGS: The examination is degraded secondary to lack of intravenous contrast due to patient's renal insufficiency. Examination is further degraded secondary to patient's overlying left upper extremity  Vascular Findings:  Abdominal aorta: The abdominal aorta is suboptimally evaluated on this noncontrast examination. The external caliber of the abdominal aorta is unchanged, measuring approximately 3.0 cm in maximal oblique axial dimension (image 31, series 3).  Celiac artery: There is a minimal amount of eccentric atherosclerotic plaque involving the origin of the celiac artery, not definitely resulting in a hemodynamically significant stenosis. Conventional branching  pattern.  SMA: There is a minimal amount of eccentric atherosclerotic plaque involving the origin of the SMA, not definitely resulting in a hemodynamically significant stenosis. The mid and distal aspects of the main trunk of the SMA are suboptimally  evaluated.  Right Renal artery: Suboptimally evaluated. There is a apparent minimal amount of focal atherosclerotic plaque involving the origin of the right renal artery though evaluation for hemodynamic significance is degraded secondary to suboptimal vessel enhancement.  Left Renal artery: There is nondiagnostic evaluation of the left renal artery secondary to suboptimal vessel opacification.  IMA: Not evaluated  Review of the MIP images confirms the above findings.   --------------------------------------------------------------------------------  Nonvascular Findings:  Bilateral renal cortical atrophy, left greater than right, appears grossly unchanged. No urinary obstruction or perinephric stranding.  Normal hepatic contour. Normal noncontrast appearance of the gallbladder. Normal noncontrast appearance of the bilateral adrenal glands, pancreas and spleen. Visualized loops of bowel appear normal. No retroperitoneal or mesenteric adenopathy within the imaged upper abdomen.  IMPRESSION: 1. Markedly degraded examination with nondiagnostic evaluation of the left renal artery. There is eccentric atherosclerotic plaque involving the origin of the right renal artery though evaluation for hemodynamic significance is degraded secondary to suboptimal opacification of the downstream right renal artery. If renal artery stenosis remains of clinical concern, further evaluation could be performed with Renal artery duplex ultrasound which if abnormal could be confirmed with a CO2 angiogram. 2. Grossly unchanged approximately 3.0 cm infrarenal abdominal aortic aneurysm.   Electronically Signed   By: Simonne Come M.D.   On: 01/19/2015 08:09   Nm Myocar Multi W/spect W/wall Motion / Ef  01/18/2015   CLINICAL DATA:  elevated troponins, tobacco abuse, HTN, GOUT, schizophrenia, bilateral leg pain.  EXAM: MYOCARDIAL IMAGING WITH SPECT (REST AND PHARMACOLOGIC-STRESS)  GATED LEFT VENTRICULAR WALL MOTION STUDY  LEFT VENTRICULAR  EJECTION FRACTION  TECHNIQUE: Standard myocardial SPECT imaging was performed after resting intravenous injection of 10 mCi Tc-55m sestamibi. Subsequently, intravenous infusion of Lexiscan was performed under the supervision of the Cardiology staff. At peak effect of the drug, 30 mCi Tc-110m sestamibi was injected intravenously and standard myocardial SPECT imaging was performed. Quantitative gated imaging was also performed to evaluate left ventricular wall motion, and estimate left ventricular ejection fraction.  COMPARISON:  None.  FINDINGS: Perfusion: Mildly decreased activity in the anteroseptal region of the left ventricle and lateral wall, on both rest and stress sequences. No decreased activity in the left ventricle on stress imaging to suggest reversible ischemia or infarction. There is a moderate amount of subdiaphragmatic activity noted on both sequences.  Wall Motion: Normal left ventricular wall motion. No left ventricular dilation.  Left Ventricular Ejection Fraction: 60 %  End diastolic volume 18 ml  End systolic volume 45 ml  IMPRESSION: 1. No reversible ischemia. Lateral and anteroseptal attenuation versus scar.  2. Normal left ventricular wall motion.  3. Left ventricular ejection fraction 60%  4. Low-risk stress test findings*.  *2012 Appropriate Use Criteria for Coronary Revascularization Focused Update: J Am Coll Cardiol. 2012;59(9):857-881. http://content.dementiazones.com.aspx?articleid=[email or web address]   Electronically Signed   By: Corlis Leak M.D.   On: 01/18/2015 14:26   Dg Chest Port 1 View  01/19/2015   CLINICAL DATA:  Sudden onset of fever.  EXAM: PORTABLE CHEST - 1 VIEW  COMPARISON:  01/13/2015  FINDINGS: A single AP portable view of the chest demonstrates no focal airspace consolidation or alveolar edema. The lungs are grossly clear. There is no large effusion or pneumothorax. Cardiac and mediastinal contours  appear unremarkable.  There is no significant interval change.  IMPRESSION: No  acute finding   Electronically Signed   By: Ellery Plunk M.D.   On: 01/19/2015 01:37   Dg Foot 2 Views Left  01/18/2015   CLINICAL DATA:  Diffuse pain. History of gout. No history of trauma  EXAM: LEFT FOOT - 2 VIEW  COMPARISON:  None.  FINDINGS: Frontal and lateral views were obtained. There is no demonstrable fracture or dislocation. There is mild erosive change in the first MTP joint. There is no other erosive change. There is soft tissue swelling dorsally. Other joint spaces appear intact. There is a minimal inferior calcaneal spur.  IMPRESSION: Erosive change first MTP joint. Suspect gout. No fracture or dislocation. Soft tissue swelling dorsally.   Electronically Signed   By: Bretta Bang III M.D.   On: 01/18/2015 17:15    Objective  Filed Vitals:   01/18/15 2033 01/18/15 2355 01/19/15 0208 01/19/15 0447  BP: 161/87   105/63  Pulse: 107   77  Temp: 100.4 F (38 C) 102.3 F (39.1 C) 100.5 F (38.1 C) 98.7 F (37.1 C)  TempSrc: Oral Oral Oral Oral  Resp: 18   18  Height:      Weight:      SpO2: 100%   100%    Intake/Output Summary (Last 24 hours) at 01/19/15 1048 Last data filed at 01/19/15 1015  Gross per 24 hour  Intake 886.67 ml  Output   2176 ml  Net -1289.33 ml   Filed Weights   01/13/15 1452  Weight: 59.6 kg (131 lb 6.3 oz)    Exam:  General:  NAD  HEENT: no scleral icterus  Cardiovascular: RRR  Respiratory: CTA biL  Abdomen: soft, non tender  MSK/Extremities: no clubbing/cyanosis, mild bilateral knee swelling improved, very tender left toe  Skin: no rashes  Neuro: non focal   Data Reviewed: Basic Metabolic Panel:  Recent Labs Lab 01/15/15 0430 01/16/15 0514 01/17/15 0540 01/18/15 0528 01/19/15 0124  NA 139 139 138 136 135  K 4.7 4.7 5.0 4.9 5.3*  CL 109 105 107 107 108  CO2 20 22 22 20  18*  GLUCOSE 102* 107* 130* 130* 157*  BUN 50* 54* 59* 65* 61*  CREATININE 2.65* 2.75* 2.77* 3.06* 3.16*  CALCIUM 8.3* 8.4 8.3* 8.3* 8.2*    Liver Function Tests:  Recent Labs Lab 01/13/15 0920  AST 16  ALT 14  ALKPHOS 94  BILITOT 0.5  PROT 6.9  ALBUMIN 2.8*   CBC:  Recent Labs Lab 01/13/15 0920 01/13/15 1601 01/15/15 0430 01/16/15 0514 01/18/15 0528  WBC 13.6* 12.4* 11.7* 9.5 8.3  NEUTROABS 12.7*  --   --   --   --   HGB 9.5* 8.6* 8.2* 8.4* 8.6*  HCT 29.8* 27.0* 26.1* 26.2* 26.7*  MCV 101.4* 100.4* 102.0* 100.4* 101.1*  PLT 274 258 248 249 231   Cardiac Enzymes:  Recent Labs Lab 01/13/15 1601 01/13/15 2054 01/14/15 0241  TROPONINI 0.55* 0.77* 1.04*    Recent Results (from the past 240 hour(s))  Culture, blood (routine x 2)     Status: None   Collection Time: 01/13/15  9:23 AM  Result Value Ref Range Status   Specimen Description BLOOD RIGHT HAND  Final   Special Requests BOTTLES DRAWN AEROBIC AND ANAEROBIC  Final   Culture   Final    NO GROWTH 5 DAYS Performed at [facility]    Report Status 01/19/2015 FINAL  Final  Culture, blood (routine x 2)     Status: None   Collection Time: 01/13/15  9:31 AM  Result Value Ref Range Status   Specimen Description BLOOD RIGHT ANTECUBITAL  Final   Special Requests BOTTLES DRAWN AEROBIC AND ANAEROBIC  5ML  Final   Culture   Final    NO GROWTH 5 DAYS [facility]    Report Status 01/19/2015 FINAL  Final  Urine culture     Status: None   Collection Time: 01/13/15 11:36 AM  Result Value Ref Range Status   Specimen Description URINE, CLEAN CATCH  Final   Special Requests NONE  Final   Colony Count NO GROWTH [facility]   Final   Culture NO GROWTH [facility]   Final   Report Status 01/14/2015 FINAL  Final     Scheduled Meds: . amLODipine  5 mg Oral Daily  . aspirin EC  325 mg Oral Daily  . benztropine  1 mg Oral Daily  . ferrous gluconate  324 mg Oral Q breakfast  . folic acid  1 mg Oral Daily  . [START ON 01/26/2015] haloperidol decanoate  100 mg Intramuscular Q6  weeks  . heparin  5,000 Units Subcutaneous 3 times per day  . metoprolol tartrate  25 mg Oral BID  . predniSONE  20 mg Oral Q breakfast  . sodium chloride  3 mL Intravenous Q12H  . sodium polystyrene  15 g Oral Once   Continuous Infusions:    Pamella Pertostin Gherghe, MD [facility] Pager [phone]. If 7 PM - 7 AM, please contact night-coverage at [email or web address], password [email or web address] 01/19/2015, 10:48 AM  LOS: 6 days

## 2015-01-20 LAB — BASIC METABOLIC PANEL
Anion gap: 10 (ref 5–15)
BUN: 67 mg/dL — AB (ref 6–23)
CO2: 20 mmol/L (ref 19–32)
Calcium: 8.5 mg/dL (ref 8.4–10.5)
Chloride: 105 mmol/L (ref 96–112)
Creatinine, Ser: 3.34 mg/dL — ABNORMAL HIGH (ref 0.50–1.35)
GFR calc Af Amer: 21 mL/min — ABNORMAL LOW (ref >90)
GFR, EST NON AFRICAN AMERICAN: 18 mL/min — AB (ref >90)
GLUCOSE: 147 mg/dL — AB (ref 70–99)
POTASSIUM: 4.8 mmol/L (ref 3.5–5.1)
SODIUM: 135 mmol/L (ref 135–145)

## 2015-01-20 MED ORDER — FERROUS GLUCONATE 324 (38 FE) MG PO TABS: 324.0000 mg | ORAL_TABLET | Freq: Every day | ORAL | Status: DC

## 2015-01-20 MED ORDER — ASPIRIN 325 MG PO TBEC: 325.0000 mg | DELAYED_RELEASE_TABLET | Freq: Every day | ORAL | Status: AC

## 2015-01-20 MED ORDER — OXYCODONE-ACETAMINOPHEN 5-325 MG PO TABS: 1.0000 | ORAL_TABLET | ORAL | Status: DC | PRN

## 2015-01-20 MED ORDER — FOLIC ACID 1 MG PO TABS: 1.0000 mg | ORAL_TABLET | Freq: Every day | ORAL | Status: AC

## 2015-01-20 MED ORDER — PREDNISONE 5 MG PO TABS: 5.0000 mg | ORAL_TABLET | Freq: Two times a day (BID) | ORAL | Status: DC

## 2015-01-20 MED ORDER — METOPROLOL TARTRATE 25 MG PO TABS: 25.0000 mg | ORAL_TABLET | Freq: Two times a day (BID) | ORAL | Status: AC

## 2015-01-20 NOTE — Discharge Summary (Signed)
Physician Discharge Summary  [name] Wise ZOX:[id number] DOB: [birth year] DOA: 01/13/2015  PCP: Ralene Ok, MD  Admit date: 01/13/2015 Discharge date: 01/20/2015  Time spent: 45 minutes  Recommendations for Outpatient Follow-up:  1. Follow up with Dr. Ludwig Clarks in 2-4 weeks 2. Follow up with Dr. Lowell Guitar at [facility] this Friday 2/19 am (address and telephone below), please have patient arrive no later than 10 am with current medication list. 3. Please repeat a BMP in 2 days   Discharge Diagnoses:  Active Problems:   Gout flare   Leukocytosis   Hyponatremia   Macrocytic anemia   Sinus tachycardia   AKI (acute kidney injury)   Demand ischemia of myocardium   Essential hypertension   Tobacco abuse   Primary gout   Acute kidney injury   Anemia, iron deficiency   Folic acid deficiency   Paranoid schizophrenia  Discharge Condition: stable  Diet recommendation: renal   Filed Weights   01/13/15 1452  Weight: 59.6 kg (131 lb 6.3 oz)   History of present illness:  [name] Wise is a 64 y.o. male with past medical history of gout and essential hypertension that is brought to the emergency room by EMS. Per EMS reports the living conditions were in horrible. He relates I lateral lower extremity pain worst on the right great toe and ankle but also at the knee on the right and left knee and ankle chest progressively gotten worse over the last month. He relates he has been taking his indomethacin without any improvement, he is barely able to ambulate and he reaches crutches to move around, he also relates some urinary incontinence. Pt's denies any chest, sob or palpitation.  On admission patient was started on IV steroids for his gout. A troponin was checked in the emergency room, and was found to be elevated at 0.55 and slowly increasing to 1. Patient without any chest pain. Cardiology was consulted and patient underwent a stress test on 2/14 Which was essentially normal. His renal function  initially improved, however it continued to get worse, and nephrology was consulted and have evaluated patient, he will need close outpatient follow-up. Patient was febrile 2/14-2/13, and out of concern for septic arthritis given knee swelling orthopedic surgery was consulted.  Hospital Course:   Chronic kidney disease stage IV - patient on admission with Cr of 3.34 with unknown previous baseline. His NSAIDs were discontinued and was placed on steroids for his gout. Nephrology was consulted and have followed patient while hospitalized. His renal function improved transiently however he returned to a Cr of 3.34 on day of discharge. I have discussed with Dr. Arlean Hopping last week and Dr. Eliott Nine on the day of discharge, he is likely stabilizing to his baseline currently. He will need close outpatient follow up, and he has an appointment with Dr. Lowell Guitar this Friday. I recommend a BMP in 2 days at the SNF. Fever 2/14 - 2/15 nights - likely due to gout flare, urinalysis without any evidence of infection and chest x-ray without airspace disease. Blood cultures negative, final is pending. He is afebrile now. He has no respiratory/GI symptoms. His only complaint is his knee pain and left toe pain. I have also consulted orthopedic surgery to evaluate his knee complaints and they did not appreciate his clinical picture to be consistent with septic arthritis.  HTN - continue current medications Acute Gout flare - improving with steroids, continue Prednisone per taper, can use pain agents except NSAIDs. Encourage PT Leukocytosis - resolved Hyponatremia- Likely due  to dehydration, resolved Macrocytic anemia - has iron deficiency and folate deficiency, start supplements  Sinus tachycardia, troponin elevation- Unclear etiology, 2-D echo without any wall motion abnormalities, cardiology consulted and patient was started on Aspirin. He also underwent a stress test on 2/14 with low risk findings.  History of paranoid  schizophrenia -Psychiatry consulted while hospitalized.   Procedures:  Stress test   Consultations:  Nephrology  Cardiology   Orthopedic surgery   Discharge Exam: Filed Vitals:   01/19/15 1315 01/19/15 2040 01/20/15 0515 01/20/15 1103  BP: 131/74 117/70 129/80 132/73  Pulse: 100 69 74 69  Temp: 98.9 F (37.2 C) 100 F (37.8 C) 98.3 F (36.8 C)   TempSrc: Oral Oral Oral   Resp: 18 20 16    Height:      Weight:      SpO2: 100% 100% 100%    General: NAD Cardiovascular: RRR Respiratory: CTA biL  Discharge Instructions     Medication List    STOP taking these medications        colchicine 0.6 MG tablet     haloperidol decanoate 100 MG/ML injection  Commonly known as:  HALDOL DECANOATE     indomethacin 25 MG capsule  Commonly known as:  INDOCIN     probenecid 500 MG tablet  Commonly known as:  BENEMID      TAKE these medications        amLODipine 10 MG tablet  Commonly known as:  NORVASC  Take 10 mg by mouth daily.     aspirin 325 MG EC tablet  Take 1 tablet (325 mg total) by mouth daily.     benztropine 1 MG tablet  Commonly known as:  COGENTIN  Take 1 tablet by mouth daily.     ferrous gluconate 324 MG tablet  Commonly known as:  FERGON  Take 1 tablet (324 mg total) by mouth daily with breakfast.     folic acid 1 MG tablet  Commonly known as:  FOLVITE  Take 1 tablet (1 mg total) by mouth daily.     metoprolol tartrate 25 MG tablet  Commonly known as:  LOPRESSOR  Take 1 tablet (25 mg total) by mouth 2 (two) times daily.     oxyCODONE-acetaminophen 5-325 MG per tablet  Commonly known as:  PERCOCET/ROXICET  Take 1-2 tablets by mouth every 4 (four) hours as needed for moderate pain or severe pain.     predniSONE 5 MG tablet  Commonly known as:  DELTASONE  Take 1 tablet (5 mg total) by mouth 2 (two) times daily. After 3 days take 1 tablet daily for 4 days then stop       Follow-up Information    Follow up with Ralene OkMOREIRA,ROY, MD. Schedule  an appointment as soon as possible for a visit in 2 weeks.   Specialty:  Internal Medicine   Contact information:   [address] [place] [address] [phone]       Follow up with Lauris PoagPOWELL,ALVIN C, MD In 3 days.   Specialty:  Nephrology   Contact information:   [address]                          [place] [id number] [phone]      The results of significant diagnostics from this hospitalization (including imaging, microbiology, ancillary and laboratory) are listed below for reference.    Significant Diagnostic Studies: Ct Abdomen Wo Contrast  01/17/2015   CLINICAL DATA:  Stage 4 chronic kidney disease.  EXAM: CT ABDOMEN WITHOUT CONTRAST  TECHNIQUE: Multidetector CT imaging of the abdomen was performed following the standard protocol without IV contrast.  COMPARISON:  None.  FINDINGS: Lower chest: Mild cardiomegaly noted. Tiny bilateral pleural effusions versus pleural thickening.  Hepatobiliary: No mass visualized on this non-contrast exam. Gallbladder is unremarkable.  Pancreas: No mass or inflammatory process visualized on this non-contrast exam.  Spleen:  Within normal limits in size.  Adrenal Glands/Kidneys: No adrenal mass identified. Mild bilateral renal parenchymal scarring and atrophy noted. Tiny punctate calcifications could be due to nonobstructing bilateral renal calculi versus vascular calcification. No evidence of hydronephrosis.  Stomach/Bowel/Peritoneum:  Unremarkable.  Vascular/Lymphatic: No pathologically enlarged lymph nodes identified. 3.1 cm infrarenal abdominal aortic aneurysm noted. No evidence of retroperitoneal hemorrhage.  Other:  None.  Musculoskeletal:  No suspicious bone lesions identified.  IMPRESSION: Mild bilateral renal parenchymal scarring and atrophy. No evidence of hydronephrosis.  Tiny nonobstructing bilateral renal calculi versus vascular calcifications.  3.1 cm infrarenal abdominal aortic aneurysm. No evidence of aneurysm leak or rupture.  Recommend followup by ultrasound in 3 years. This recommendation follows ACR consensus guidelines: White Paper of the ACR Incidental Findings Committee II on Vascular Findings. [place] Coll Radiol 2013; 10:789-794   Electronically Signed   By: Myles Rosenthal M.D.   On: 01/17/2015 16:23   Dg Chest 2 View  01/13/2015   CLINICAL DATA:  Bilateral lower extremity pain  EXAM: CHEST  2 VIEW  COMPARISON:  None.  FINDINGS: Lungs are clear. Heart is upper normal in size with pulmonary vascularity within normal limits. No adenopathy. No bone lesions.  IMPRESSION: No edema or consolidation.   Electronically Signed   By: Bretta Bang III M.D.   On: 01/13/2015 09:57   Dg Knee 1-2 Views Right  01/17/2015   CLINICAL DATA:  Right knee pain and swelling for 2-3 days. No known injury. Gout.  EXAM: RIGHT KNEE - 1-2 VIEW  COMPARISON:  None.  FINDINGS: No evidence of acute fracture or dislocation. Diffuse soft tissue swelling is seen most severe in the suprasellar region. Small knee joint effusion cannot be excluded. No evidence joint space narrowing, osteophytosis, or periarticular erosions. Peripheral vascular calcification noted.  IMPRESSION: Diffuse soft tissue swelling. Small knee joint effusion cannot be excluded. No osseous abnormality identified.   Electronically Signed   By: Myles Rosenthal M.D.   On: 01/17/2015 14:03   Mr Maxine Glenn Abdomen Wo Contrast  01/19/2015   CLINICAL DATA:  Unexplained renal failure. Evaluate for renal artery stenosis. Stage 4 kidney disease with cortical thinning.  EXAM: MRA ABDOMEN WITHOUT CONTRAST  TECHNIQUE: Angiographic images of the chest were obtained using MRA technique without intravenous contrast.  CONTRAST:  None  COMPARISON:  CT abdomen pelvis - 01/17/2015 ; renal ultrasound - 01/15/2015  FINDINGS: The examination is degraded secondary to lack of intravenous contrast due to patient's renal insufficiency. Examination is further degraded secondary to patient's overlying left upper extremity   Vascular Findings:  Abdominal aorta: The abdominal aorta is suboptimally evaluated on this noncontrast examination. The external caliber of the abdominal aorta is unchanged, measuring approximately 3.0 cm in maximal oblique axial dimension (image 31, series 3).  Celiac artery: There is a minimal amount of eccentric atherosclerotic plaque involving the origin of the celiac artery, not definitely resulting in a hemodynamically significant stenosis. Conventional branching pattern.  SMA: There is a minimal amount of eccentric atherosclerotic plaque involving the origin of the SMA, not definitely resulting in a  hemodynamically significant stenosis. The mid and distal aspects of the main trunk of the SMA are suboptimally evaluated.  Right Renal artery: Suboptimally evaluated. There is a apparent minimal amount of focal atherosclerotic plaque involving the origin of the right renal artery though evaluation for hemodynamic significance is degraded secondary to suboptimal vessel enhancement.  Left Renal artery: There is nondiagnostic evaluation of the left renal artery secondary to suboptimal vessel opacification.  IMA: Not evaluated  Review of the MIP images confirms the above findings.   --------------------------------------------------------------------------------  Nonvascular Findings:  Bilateral renal cortical atrophy, left greater than right, appears grossly unchanged. No urinary obstruction or perinephric stranding.  Normal hepatic contour. Normal noncontrast appearance of the gallbladder. Normal noncontrast appearance of the bilateral adrenal glands, pancreas and spleen. Visualized loops of bowel appear normal. No retroperitoneal or mesenteric adenopathy within the imaged upper abdomen.  IMPRESSION: 1. Markedly degraded examination with nondiagnostic evaluation of the left renal artery. There is eccentric atherosclerotic plaque involving the origin of the right renal artery though evaluation for hemodynamic  significance is degraded secondary to suboptimal opacification of the downstream right renal artery. If renal artery stenosis remains of clinical concern, further evaluation could be performed with Renal artery duplex ultrasound which if abnormal could be confirmed with a CO2 angiogram. 2. Grossly unchanged approximately 3.0 cm infrarenal abdominal aortic aneurysm.   Electronically Signed   By: Simonne Come M.D.   On: 01/19/2015 08:09   US Renal  01/15/2015   CLINICAL DATA:  Initial evaluation for renal failure  EXAM: RENAL/URINARY TRACT ULTRASOUND COMPLETE  COMPARISON:  Renal ultrasound 10/03/2003  FINDINGS: Right Kidney:  Length: 10.0 cm. Significant cortical thinning. Increased echogenicity. No hydronephrosis.  Left Kidney:  Length: 9.5 cm. Significant cortical thinning. Increased echogenicity. No hydronephrosis.  Bladder:  Bladder is decompressed by Foley catheter.  IMPRESSION: Similar to prior study there is significant cortical atrophy with evidence of medical renal disease but there are no acute findings.   Electronically Signed   By: Esperanza Heir M.D.   On: 01/15/2015 15:56   Nm Myocar Multi W/spect W/wall Motion / Ef  01/18/2015   CLINICAL DATA:  elevated troponins, tobacco abuse, HTN, GOUT, schizophrenia, bilateral leg pain.  EXAM: MYOCARDIAL IMAGING WITH SPECT (REST AND PHARMACOLOGIC-STRESS)  GATED LEFT VENTRICULAR WALL MOTION STUDY  LEFT VENTRICULAR EJECTION FRACTION  TECHNIQUE: Standard myocardial SPECT imaging was performed after resting intravenous injection of 10 mCi Tc-25m sestamibi. Subsequently, intravenous infusion of Lexiscan was performed under the supervision of the Cardiology staff. At peak effect of the drug, 30 mCi Tc-39m sestamibi was injected intravenously and standard myocardial SPECT imaging was performed. Quantitative gated imaging was also performed to evaluate left ventricular wall motion, and estimate left ventricular ejection fraction.  COMPARISON:  None.  FINDINGS:  Perfusion: Mildly decreased activity in the anteroseptal region of the left ventricle and lateral wall, on both rest and stress sequences. No decreased activity in the left ventricle on stress imaging to suggest reversible ischemia or infarction. There is a moderate amount of subdiaphragmatic activity noted on both sequences.  Wall Motion: Normal left ventricular wall motion. No left ventricular dilation.  Left Ventricular Ejection Fraction: 60 %  End diastolic volume 18 ml  End systolic volume 45 ml  IMPRESSION: 1. No reversible ischemia. Lateral and anteroseptal attenuation versus scar.  2. Normal left ventricular wall motion.  3. Left ventricular ejection fraction 60%  4. Low-risk stress test findings*.  *2012 Appropriate Use Criteria for Coronary Revascularization Focused Update: J Am Coll  Cardiol. 2012;59(9):857-881. http://content.dementiazones.com.aspx?articleid=1201161   Electronically Signed   By: Corlis Leak M.D.   On: 01/18/2015 14:26   Dg Chest Port 1 View  01/19/2015   CLINICAL DATA:  Sudden onset of fever.  EXAM: PORTABLE CHEST - 1 VIEW  COMPARISON:  01/13/2015  FINDINGS: A single AP portable view of the chest demonstrates no focal airspace consolidation or alveolar edema. The lungs are grossly clear. There is no large effusion or pneumothorax. Cardiac and mediastinal contours appear unremarkable.  There is no significant interval change.  IMPRESSION: No acute finding   Electronically Signed   By: Ellery Plunk M.D.   On: 01/19/2015 01:37   Dg Foot 2 Views Left  01/18/2015   CLINICAL DATA:  Diffuse pain. History of gout. No history of trauma  EXAM: LEFT FOOT - 2 VIEW  COMPARISON:  None.  FINDINGS: Frontal and lateral views were obtained. There is no demonstrable fracture or dislocation. There is mild erosive change in the first MTP joint. There is no other erosive change. There is soft tissue swelling dorsally. Other joint spaces appear intact. There is a minimal inferior calcaneal spur.   IMPRESSION: Erosive change first MTP joint. Suspect gout. No fracture or dislocation. Soft tissue swelling dorsally.   Electronically Signed   By: Bretta Bang III M.D.   On: 01/18/2015 17:15    Microbiology: Recent Results (from the past 240 hour(s))  Culture, blood (routine x 2)     Status: None   Collection Time: 01/13/15  9:23 AM  Result Value Ref Range Status   Specimen Description BLOOD RIGHT HAND  Final   Special Requests BOTTLES DRAWN AEROBIC AND ANAEROBIC  Final   Culture   Final    NO GROWTH 5 DAYS Performed at [facility]    Report Status 01/19/2015 FINAL  Final  Culture, blood (routine x 2)     Status: None   Collection Time: 01/13/15  9:31 AM  Result Value Ref Range Status   Specimen Description BLOOD RIGHT ANTECUBITAL  Final   Special Requests BOTTLES DRAWN AEROBIC AND ANAEROBIC   Final   Culture   Final    NO GROWTH 5 DAYS Performed at [facility]    Report Status 01/19/2015 FINAL  Final  Urine culture     Status: None   Collection Time: 01/13/15 11:36 AM  Result Value Ref Range Status   Specimen Description URINE, CLEAN CATCH  Final   Special Requests NONE  Final   Colony Count NO GROWTH Performed at [facility]   Final   Culture NO GROWTH Performed at [facility]   Final   Report Status 01/14/2015 FINAL  Final  Culture, blood (routine x 2)     Status: None (Preliminary result)   Collection Time: 01/19/15  1:20 AM  Result Value Ref Range Status   Specimen Description BLOOD RIGHT ARM  Final   Special Requests BOTTLES DRAWN AEROBIC AND ANAEROBIC 10CC  Final   Culture   Final           BLOOD CULTURE RECEIVED NO GROWTH TO DATE CULTURE WILL BE HELD FOR 5 DAYS BEFORE ISSUING A FINAL NEGATIVE REPORT Performed at [facility]    Report Status PENDING  Incomplete  Culture, blood (routine x 2)     Status: None (Preliminary result)   Collection Time: 01/19/15  1:24 AM  Result Value Ref Range Status    Specimen Description BLOOD RIGHT HAND  Final  Special Requests BOTTLES DRAWN AEROBIC ONLY 10CC  Final   Culture   Final           BLOOD CULTURE RECEIVED NO GROWTH TO DATE CULTURE WILL BE HELD FOR 5 DAYS BEFORE ISSUING A FINAL NEGATIVE REPORT Performed at [facility]    Report Status PENDING  Incomplete     Labs: Basic Metabolic Panel:  Recent Labs Lab 01/13/15 1601 01/14/15 1025 01/15/15 0430 01/16/15 0514 01/17/15 0540 01/18/15 0528 01/19/15 0124 01/20/15 0600  NA  --  136 139 139 138 136 135 135  K  --  4.6 4.7 4.7 5.0 4.9 5.3* 4.8  CL  --  108 109 105 107 107 108 105  CO2  --  19 20 22 22 20  18* 20  GLUCOSE  --  170* 102* 107* 130* 130* 157* 147*  BUN  --  47* 50* 54* 59* 65* 61* 67*  CREATININE 2.96* 2.68* 2.65* 2.75* 2.77* 3.06* 3.16* 3.34*  CALCIUM  --  8.3* 8.3* 8.4 8.3* 8.3* 8.2* 8.5   CBC:  Recent Labs Lab 01/13/15 1601 01/15/15 0430 01/16/15 0514 01/18/15 0528  WBC 12.4* 11.7* 9.5 8.3  HGB 8.6* 8.2* 8.4* 8.6*  HCT 27.0* 26.1* 26.2* 26.7*  MCV 100.4* 102.0* 100.4* 101.1*  PLT 258 248 249 231   Cardiac Enzymes:  Recent Labs Lab 01/13/15 1601 01/13/15 2054 01/14/15 0241  TROPONINI 0.55* 0.77* 1.04*   Signed:  Bedford Winsor  [facility] 01/20/2015, 11:05 AM

## 2015-01-20 NOTE — Progress Notes (Signed)
Patient is set to [facility] today. Patient & friend, Rande LawmanMary Wakeman aware. Discharge packet given to RN, Amil AmenJulia. [facility] called for transport.   Clinical Social Work Department CLINICAL SOCIAL WORK PLACEMENT NOTE 01/20/2015  Patient:  Krotzer,Kendall  Account Number:  [id number] Admit date:  01/13/2015  Clinical Social Worker:  Orpah GreekKELLY FOLEY, LCSWA  Date/time:  01/15/2015 11:27 AM  Clinical Social Work is seeking post-discharge placement for this patient at the following level of care:   SKILLED NURSING   (*CSW will update this form in Epic as items are completed)   01/15/2015  Patient/family provided with [facility] Department of Clinical Social Work's list of facilities offering this level of care within the geographic area requested by the patient (or if unable, by the patient's family).  01/15/2015  Patient/family informed of their freedom to choose among providers that offer the needed level of care, that participate in Medicare, Medicaid or managed care program needed by the patient, have an available bed and are willing to accept the patient.  01/15/2015  Patient/family informed of [facility]' ownership interest in [facility], as well as of the fact that they are under no obligation to receive care at this facility.  PASARR submitted to EDS on 01/15/2015 PASARR number received on 01/15/2015  FL2 transmitted to all facilities in geographic area requested by pt/family on  01/15/2015 FL2 transmitted to all facilities within larger geographic area on   Patient informed that his/her managed care company has contracts with or will negotiate with  certain facilities, including the following:     Patient/family informed of bed offers received:  01/17/2015 [facility] [place] Physician recommends and patient chooses bed at    [facility] [place] on   01/20/2015 Patient to be transferred to facility by [facility] Patient and family notified of transfer on 01/20/2015 Name of family member notified:  patient's friend, Rande LawmanMary Wakeman via phone  The following physician request were entered in Epic:   Additional Comments:     Lincoln MaxinKelly Galilee Pierron, LCSW [facility] Clinical Social Worker cell #: [phone]

## 2015-01-21 ENCOUNTER — Non-Acute Institutional Stay (SKILLED_NURSING_FACILITY): Payer: Medicare Other | Admitting: Adult Health

## 2015-01-21 ENCOUNTER — Encounter: Payer: Self-pay | Admitting: Adult Health

## 2015-01-21 DIAGNOSIS — I10 Essential (primary) hypertension: Secondary | ICD-10-CM

## 2015-01-21 DIAGNOSIS — M1009 Idiopathic gout, multiple sites: Secondary | ICD-10-CM

## 2015-01-21 DIAGNOSIS — M109 Gout, unspecified: Secondary | ICD-10-CM

## 2015-01-21 DIAGNOSIS — D509 Iron deficiency anemia, unspecified: Secondary | ICD-10-CM | POA: Diagnosis not present

## 2015-01-21 DIAGNOSIS — I5032 Chronic diastolic (congestive) heart failure: Secondary | ICD-10-CM

## 2015-01-21 DIAGNOSIS — N184 Chronic kidney disease, stage 4 (severe): Secondary | ICD-10-CM

## 2015-01-21 NOTE — Progress Notes (Signed)
Patient ID: [name] Wise, male   DOB: May 09, 1951, 64 y.o.   MRN: [id number]  [facility]     No Known Allergies     Chief Complaint  Patient presents with  . Hospitalization Follow-up    HPI:  He has been hospitalized for acute gout flare.  He is here for short term rehab. At this time his goal is to return back home. His pain is presently being managed in his hands. There are no nursing concerns at this time. I am not certain at this time if this will be a short term stay or not.      Past Medical History  Diagnosis Date  . Gout   . Hypertension   . Schizophrenia   . Tobacco abuse     Past Surgical History  Procedure Laterality Date  . None      VITAL SIGNS BP 129/79 mmHg  Pulse 80  Ht 5\' 6"  (1.676 m)  Wt 131 lb (59.421 kg)  BMI 21.15 kg/m2   Outpatient Encounter Prescriptions as of 01/21/2015  Medication Sig  . amLODipine (NORVASC) 10 MG tablet Take 10 mg by mouth daily.  Marland Kitchen. aspirin EC 325 MG EC tablet Take 1 tablet (325 mg total) by mouth daily.  . benztropine (COGENTIN) 1 MG tablet Take 1 tablet by mouth daily.  . ferrous gluconate (FERGON) 324 MG tablet Take 1 tablet (324 mg total) by mouth daily with breakfast.  . folic acid (FOLVITE) 1 MG tablet Take 1 tablet (1 mg total) by mouth daily.  . metoprolol tartrate (LOPRESSOR) 25 MG tablet Take 1 tablet (25 mg total) by mouth 2 (two) times daily.  Marland Kitchen. oxyCODONE-acetaminophen (PERCOCET/ROXICET) 5-325 MG per tablet Take 1-2 tablets by mouth every 4 (four) hours as needed for moderate pain or severe pain.  . predniSONE (DELTASONE) 5 MG tablet Take 1 tablet (5 mg total) by mouth 2 (two) times daily. After 3 days take 1 tablet daily for 4 days then stop     SIGNIFICANT DIAGNOSTIC EXAMS  01-13-15: chest x-ray: No edema or consolidation.  01-15-15: renal ultrasound: Similar to prior study there is significant cortical atrophy with evidence of medical renal disease but there are no acute findings.  01-17-15: right knee  x-ray: Diffuse soft tissue swelling. Small knee joint effusion cannot be excluded. No osseous abnormality identified.  01-17-15: ct of abdomen: Mild bilateral renal parenchymal scarring and atrophy. No evidence of hydronephrosis.  Tiny nonobstructing bilateral renal calculi versus vascular calcifications.3.1 cm infrarenal abdominal aortic aneurysm. No evidence of aneurysm leak or rupture. Recommend followup by ultrasound in 3 years.   01-18-15: myoview: Perfusion: Mildly decreased activity in the anteroseptal region of the left ventricle and lateral wall, on both rest and stress sequences. No decreased activity in the left ventricle on stress imaging to suggest reversible ischemia or infarction. There is a moderate amount of subdiaphragmatic activity noted on both sequences. Wall Motion: Normal left ventricular wall motion. No left ventricular dilation.  01-18-15: left foot x-ray: Erosive change first MTP joint. Suspect gout. No fracture or dislocation. Soft tissue swelling dorsally    LABS REVIEWED:   01-13-15: wbc 13.6; hgb 9.5; hct 29.8; mcv 101.4; plt 274; glucose 191; bun 55; creat 3.34; k+ 4.8; na++133; liver normal albumin 2.8; urine culture: no growth 01-15-15: wbc 11.7; hgb 8.2; hct 26.1; mcv 102.0; plt 248; glucose 102; bun 50; creat 2.65; k+4.7; na++138; uric acid 8.8; vit b12: 276; folate 2.8; iron 35; tibc 146; ferritin 444  Review of Systems  Constitutional: Negative for malaise/fatigue.  Eyes: Positive for pain and discharge.       Left eye   Respiratory: Negative for cough and shortness of breath.   Cardiovascular: Negative for chest pain, palpitations and leg swelling.  Gastrointestinal: Negative for heartburn, abdominal pain and constipation.  Musculoskeletal: Positive for joint pain. Negative for myalgias.       Hands   Skin: Negative.   Psychiatric/Behavioral: Negative for depression. The patient is not nervous/anxious.      Physical Exam  Constitutional: He is  oriented to person, place, and time. He appears well-developed and well-nourished. No distress.  Eyes:  Left eye red inflamed with purulent drainage present   Cardiovascular: Normal rate, regular rhythm and intact distal pulses.   Respiratory: Effort normal and breath sounds normal. No respiratory distress.  GI: Soft. Bowel sounds are normal. He exhibits no distension.  Musculoskeletal: He exhibits no edema.  Is able to move all extremities   Neurological: He is alert and oriented to person, place, and time.  Skin: Skin is warm and dry. He is not diaphoretic.       ASSESSMENT/ PLAN:  1. Left eye conjunctivitis: will begin tobramycin to left eye four times daily for one week.   2. Gout: will complete prednisone taper and  Will continue percocet 5/325 mg 1 or 2 tabs every 4 hours as needed will continue to monitor his status.   3. Hypertension: will continue norvasc 10 mg daily; and lopressor 25 mg twice daily  And asa 325 mg daily   4. Anemia: will continue fergon daily   5. Acute and diastolic heart failure: is presently stable is not on medications at this time will monitor her status.   6. Paranoid schizophrenia: is stable is followed by out patient psych services for bi monthly injections; will continue cogentin 1 mg daily for eps.   7. CKD stage IV: is without change in status; will continue to be followed by nephrology   Will check cbc and bmp    Time spent patient 50 minutes.       Synthia Innocent NP [facility]  Contact [phone] Monday through Friday 8am- 5pm  After hours call [phone]

## 2015-01-25 LAB — CULTURE, BLOOD (ROUTINE X 2)
Culture: NO GROWTH
Culture: NO GROWTH

## 2015-02-03 ENCOUNTER — Inpatient Hospital Stay (HOSPITAL_COMMUNITY)
Admission: EM | Admit: 2015-02-03 | Discharge: 2015-02-09 | DRG: 682 | Disposition: A | Discharge status: Skilled Nursing Facility | Payer: Medicare Other | Attending: Internal Medicine | Admitting: Internal Medicine

## 2015-02-03 ENCOUNTER — Emergency Department (HOSPITAL_COMMUNITY): Payer: Medicare Other

## 2015-02-03 ENCOUNTER — Inpatient Hospital Stay (HOSPITAL_COMMUNITY): Payer: Medicare Other

## 2015-02-03 DIAGNOSIS — N179 Acute kidney failure, unspecified: Principal | ICD-10-CM | POA: Diagnosis present

## 2015-02-03 DIAGNOSIS — E119 Type 2 diabetes mellitus without complications: Secondary | ICD-10-CM | POA: Diagnosis present

## 2015-02-03 DIAGNOSIS — G9340 Encephalopathy, unspecified: Secondary | ICD-10-CM | POA: Diagnosis present

## 2015-02-03 DIAGNOSIS — D509 Iron deficiency anemia, unspecified: Secondary | ICD-10-CM | POA: Diagnosis present

## 2015-02-03 DIAGNOSIS — F200 Paranoid schizophrenia: Secondary | ICD-10-CM | POA: Diagnosis present

## 2015-02-03 DIAGNOSIS — I248 Other forms of acute ischemic heart disease: Secondary | ICD-10-CM | POA: Diagnosis present

## 2015-02-03 DIAGNOSIS — E860 Dehydration: Secondary | ICD-10-CM | POA: Diagnosis present

## 2015-02-03 DIAGNOSIS — N184 Chronic kidney disease, stage 4 (severe): Secondary | ICD-10-CM | POA: Diagnosis present

## 2015-02-03 DIAGNOSIS — N390 Urinary tract infection, site not specified: Secondary | ICD-10-CM | POA: Diagnosis present

## 2015-02-03 DIAGNOSIS — F17210 Nicotine dependence, cigarettes, uncomplicated: Secondary | ICD-10-CM | POA: Diagnosis present

## 2015-02-03 DIAGNOSIS — R7881 Bacteremia: Secondary | ICD-10-CM | POA: Diagnosis present

## 2015-02-03 DIAGNOSIS — Z7982 Long term (current) use of aspirin: Secondary | ICD-10-CM

## 2015-02-03 DIAGNOSIS — R778 Other specified abnormalities of plasma proteins: Secondary | ICD-10-CM | POA: Diagnosis present

## 2015-02-03 DIAGNOSIS — Z9289 Personal history of other medical treatment: Secondary | ICD-10-CM | POA: Insufficient documentation

## 2015-02-03 DIAGNOSIS — R4701 Aphasia: Secondary | ICD-10-CM

## 2015-02-03 DIAGNOSIS — R4182 Altered mental status, unspecified: Secondary | ICD-10-CM | POA: Diagnosis not present

## 2015-02-03 DIAGNOSIS — Z79899 Other long term (current) drug therapy: Secondary | ICD-10-CM

## 2015-02-03 DIAGNOSIS — I129 Hypertensive chronic kidney disease with stage 1 through stage 4 chronic kidney disease, or unspecified chronic kidney disease: Secondary | ICD-10-CM | POA: Diagnosis present

## 2015-02-03 DIAGNOSIS — I5033 Acute on chronic diastolic (congestive) heart failure: Secondary | ICD-10-CM | POA: Diagnosis present

## 2015-02-03 DIAGNOSIS — M1000 Idiopathic gout, unspecified site: Secondary | ICD-10-CM | POA: Diagnosis present

## 2015-02-03 DIAGNOSIS — R1312 Dysphagia, oropharyngeal phase: Secondary | ICD-10-CM | POA: Diagnosis present

## 2015-02-03 DIAGNOSIS — Z87898 Personal history of other specified conditions: Secondary | ICD-10-CM

## 2015-02-03 DIAGNOSIS — Z8249 Family history of ischemic heart disease and other diseases of the circulatory system: Secondary | ICD-10-CM

## 2015-02-03 DIAGNOSIS — R4781 Slurred speech: Secondary | ICD-10-CM

## 2015-02-03 DIAGNOSIS — M25569 Pain in unspecified knee: Secondary | ICD-10-CM

## 2015-02-03 DIAGNOSIS — A499 Bacterial infection, unspecified: Secondary | ICD-10-CM | POA: Diagnosis not present

## 2015-02-03 DIAGNOSIS — D539 Nutritional anemia, unspecified: Secondary | ICD-10-CM | POA: Diagnosis present

## 2015-02-03 DIAGNOSIS — R7989 Other specified abnormal findings of blood chemistry: Secondary | ICD-10-CM | POA: Diagnosis present

## 2015-02-03 DIAGNOSIS — I10 Essential (primary) hypertension: Secondary | ICD-10-CM | POA: Diagnosis not present

## 2015-02-03 DIAGNOSIS — E875 Hyperkalemia: Secondary | ICD-10-CM | POA: Insufficient documentation

## 2015-02-03 DIAGNOSIS — R509 Fever, unspecified: Secondary | ICD-10-CM

## 2015-02-03 DIAGNOSIS — D649 Anemia, unspecified: Secondary | ICD-10-CM | POA: Diagnosis present

## 2015-02-03 DIAGNOSIS — N189 Chronic kidney disease, unspecified: Secondary | ICD-10-CM

## 2015-02-03 DIAGNOSIS — Z720 Tobacco use: Secondary | ICD-10-CM

## 2015-02-03 DIAGNOSIS — I509 Heart failure, unspecified: Secondary | ICD-10-CM

## 2015-02-03 DIAGNOSIS — R0989 Other specified symptoms and signs involving the circulatory and respiratory systems: Secondary | ICD-10-CM

## 2015-02-03 LAB — URINALYSIS, ROUTINE W REFLEX MICROSCOPIC
BILIRUBIN URINE: NEGATIVE
Glucose, UA: NEGATIVE mg/dL
KETONES UR: NEGATIVE mg/dL
LEUKOCYTES UA: NEGATIVE
Nitrite: NEGATIVE
PH: 5.5 (ref 5.0–8.0)
PROTEIN: 100 mg/dL — AB
SPECIFIC GRAVITY, URINE: 1.018 (ref 1.005–1.030)
UROBILINOGEN UA: 1 mg/dL (ref 0.0–1.0)

## 2015-02-03 LAB — CBC WITH DIFFERENTIAL/PLATELET
Basophils Absolute: 0 10*3/uL (ref 0.0–0.1)
Basophils Relative: 0 % (ref 0–1)
EOS PCT: 0 % (ref 0–5)
Eosinophils Absolute: 0 10*3/uL (ref 0.0–0.7)
HEMATOCRIT: 23.8 % — AB (ref 39.0–52.0)
Hemoglobin: 7.3 g/dL — ABNORMAL LOW (ref 13.0–17.0)
LYMPHS ABS: 1.1 10*3/uL (ref 0.7–4.0)
LYMPHS PCT: 10 % — AB (ref 12–46)
MCH: 30.4 pg (ref 26.0–34.0)
MCHC: 30.7 g/dL (ref 30.0–36.0)
MCV: 99.2 fL (ref 78.0–100.0)
Monocytes Absolute: 0.7 10*3/uL (ref 0.1–1.0)
Monocytes Relative: 7 % (ref 3–12)
Neutro Abs: 9 10*3/uL — ABNORMAL HIGH (ref 1.7–7.7)
Neutrophils Relative %: 83 % — ABNORMAL HIGH (ref 43–77)
Platelets: 254 10*3/uL (ref 150–400)
RBC: 2.4 MIL/uL — ABNORMAL LOW (ref 4.22–5.81)
RDW: 14.8 % (ref 11.5–15.5)
WBC: 10.9 10*3/uL — ABNORMAL HIGH (ref 4.0–10.5)

## 2015-02-03 LAB — APTT: aPTT: 44 seconds — ABNORMAL HIGH (ref 24–37)

## 2015-02-03 LAB — PROTIME-INR
INR: 1.11 (ref 0.00–1.49)
PROTHROMBIN TIME: 14.5 s (ref 11.6–15.2)

## 2015-02-03 LAB — COMPREHENSIVE METABOLIC PANEL
ALT: 20 U/L (ref 0–53)
AST: 24 U/L (ref 0–37)
Albumin: 2.1 g/dL — ABNORMAL LOW (ref 3.5–5.2)
Alkaline Phosphatase: 130 U/L — ABNORMAL HIGH (ref 39–117)
Anion gap: 15 (ref 5–15)
BUN: 64 mg/dL — AB (ref 6–23)
CALCIUM: 8.6 mg/dL (ref 8.4–10.5)
CO2: 21 mmol/L (ref 19–32)
Chloride: 102 mmol/L (ref 96–112)
Creatinine, Ser: 4.54 mg/dL — ABNORMAL HIGH (ref 0.50–1.35)
GFR calc Af Amer: 15 mL/min — ABNORMAL LOW (ref >90)
GFR calc non Af Amer: 13 mL/min — ABNORMAL LOW (ref >90)
GLUCOSE: 150 mg/dL — AB (ref 70–99)
Potassium: 5.2 mmol/L — ABNORMAL HIGH (ref 3.5–5.1)
Sodium: 138 mmol/L (ref 135–145)
TOTAL PROTEIN: 6.8 g/dL (ref 6.0–8.3)
Total Bilirubin: 0.3 mg/dL (ref 0.3–1.2)

## 2015-02-03 LAB — POC OCCULT BLOOD, ED: Fecal Occult Bld: NEGATIVE

## 2015-02-03 LAB — URINE MICROSCOPIC-ADD ON

## 2015-02-03 LAB — RAPID URINE DRUG SCREEN, HOSP PERFORMED
Amphetamines: NOT DETECTED
BENZODIAZEPINES: NOT DETECTED
Barbiturates: NOT DETECTED
Cocaine: NOT DETECTED
Opiates: POSITIVE — AB
Tetrahydrocannabinol: NOT DETECTED

## 2015-02-03 LAB — PROTEIN, URINE, 24 HOUR: Protein, Urine: 126 mg/dL

## 2015-02-03 LAB — I-STAT TROPONIN, ED: Troponin i, poc: 0.53 ng/mL (ref 0.00–0.08)

## 2015-02-03 LAB — ETHANOL: Alcohol, Ethyl (B): <5 mg/dL (ref 0–9)

## 2015-02-03 LAB — RETICULOCYTES
RBC.: 1.81 MIL/uL — AB (ref 4.22–5.81)
Retic Count, Absolute: 18.1 10*3/uL — ABNORMAL LOW (ref 19.0–186.0)
Retic Ct Pct: 1 % (ref 0.4–3.1)

## 2015-02-03 LAB — CREATININE, URINE, 24 HOUR: Creatinine, Urine: 66.9 mg/dL

## 2015-02-03 LAB — TROPONIN I: Troponin I: 0.96 ng/mL (ref <0.031)

## 2015-02-03 LAB — AMMONIA: Ammonia: 42 umol/L — ABNORMAL HIGH (ref 11–32)

## 2015-02-03 MED ORDER — ASPIRIN 300 MG RE SUPP
300.0000 mg | Freq: Every day | RECTAL | Status: DC
  Filled 2015-02-03 (×6): qty 1

## 2015-02-03 MED ORDER — SODIUM CHLORIDE 0.9 % IV BOLUS (SEPSIS)
1000.0000 mL | Freq: Once | INTRAVENOUS | Status: AC
  Administered 2015-02-03: 1000 mL via INTRAVENOUS

## 2015-02-03 MED ORDER — SENNOSIDES-DOCUSATE SODIUM 8.6-50 MG PO TABS: 1.0000 | ORAL_TABLET | Freq: Every evening | ORAL | Status: DC | PRN

## 2015-02-03 MED ORDER — ASPIRIN 325 MG PO TABS
325.0000 mg | ORAL_TABLET | Freq: Every day | ORAL | Status: DC
  Administered 2015-02-04 – 2015-02-09 (×6): 325 mg via ORAL
  Filled 2015-02-03 (×6): qty 1

## 2015-02-03 MED ORDER — ENOXAPARIN SODIUM 30 MG/0.3ML ~~LOC~~ SOLN
30.0000 mg | Freq: Every day | SUBCUTANEOUS | Status: DC
  Administered 2015-02-04 – 2015-02-08 (×6): 30 mg via SUBCUTANEOUS
  Filled 2015-02-03 (×6): qty 0.3

## 2015-02-03 MED ORDER — STROKE: EARLY STAGES OF RECOVERY BOOK
Freq: Once | Status: DC
  Filled 2015-02-03: qty 1

## 2015-02-03 MED ORDER — FOLIC ACID 1 MG PO TABS
1.0000 mg | ORAL_TABLET | Freq: Every day | ORAL | Status: DC
  Administered 2015-02-04 – 2015-02-09 (×6): 1 mg via ORAL
  Filled 2015-02-03 (×6): qty 1

## 2015-02-03 MED ORDER — SODIUM CHLORIDE 0.9 % IV SOLN
INTRAVENOUS | Status: DC
  Administered 2015-02-04: via INTRAVENOUS

## 2015-02-03 NOTE — ED Provider Notes (Signed)
CSN: [id number]     Arrival date & time 02/03/15  1332 History   First MD Initiated Contact with Patient 02/03/15 1512     Chief Complaint  Patient presents with  . Altered Mental Status     (Consider location/radiation/quality/duration/timing/severity/associated sxs/prior Treatment) The history is provided by the patient. A language interpreter was used [place]).  [name] Wise is a 64 y/o M with PMHx of gout, HTN, schizophrenia, tobacco abuse presenting to the ED with AMS - patient is a resident at [facility]. As per EMS report, patient was supposed to be seen at [facility] for visit regarding schizophrenia, but at [facility] reported that patient was altered and not the normal baseline. Patient reported that he has noticed his speech is not right - stated that he has been having difficulty forming words and that he has noticed slurred speech that has gotten worse. Patient reported that he did have a fall yesterday when getting out of the chair - stated that he did not hit his head. Denied chest pain, shortness of breath, back pain, leg, pain. ROS limited secondary to acuity of condition.  PCP Dr. Ludwig Clarks  Past Medical History  Diagnosis Date  . Gout   . Hypertension   . Schizophrenia   . Tobacco abuse    Past Surgical History  Procedure Laterality Date  . None     Family History  Problem Relation Age of Onset  . Hypertension Mother   . Gout Father    History  Substance Use Topics  . Smoking status: Current Every Day Smoker    Types: Cigarettes  . Smokeless tobacco: Not on file  . Alcohol Use: No    Review of Systems  Unable to perform ROS: Mental status change      Allergies  Review of patient's allergies indicates no known allergies.  Home Medications   Prior to Admission medications   Medication Sig Start Date End Date Taking? Authorizing Provider  amLODipine (NORVASC) 10 MG tablet Take 10 mg by mouth daily.   Yes Historical Provider, MD  aspirin EC 325  MG EC tablet Take 1 tablet (325 mg total) by mouth daily. 01/20/15  Yes Costin Otelia Sergeant, MD  benztropine (COGENTIN) 1 MG tablet Take 1 tablet by mouth daily. 12/15/14  Yes Historical Provider, MD  ferrous sulfate 325 (65 FE) MG tablet Take 325 mg by mouth at bedtime.    Yes Historical Provider, MD  folic acid (FOLVITE) 1 MG tablet Take 1 tablet (1 mg total) by mouth daily. 01/20/15  Yes Costin Otelia Sergeant, MD  metoprolol tartrate (LOPRESSOR) 25 MG tablet Take 1 tablet (25 mg total) by mouth 2 (two) times daily. 01/20/15  Yes Costin Otelia Sergeant, MD  oxyCODONE-acetaminophen (PERCOCET/ROXICET) 5-325 MG per tablet Take 1-2 tablets by mouth every 4 (four) hours as needed for moderate pain or severe pain. 01/20/15  Yes Costin Otelia Sergeant, MD  sodium bicarbonate 650 MG tablet Take 1,300 mg by mouth 2 (two) times daily.   Yes Historical Provider, MD  ferrous gluconate (FERGON) 324 MG tablet Take 1 tablet (324 mg total) by mouth daily with breakfast. Patient not taking: Reported on 02/03/2015 01/20/15   Leatha Gilding, MD  predniSONE (DELTASONE) 5 MG tablet Take 1 tablet (5 mg total) by mouth 2 (two) times daily. After 3 days take 1 tablet daily for 4 days then stop Patient not taking: Reported on 02/03/2015 01/20/15   Leatha Gilding, MD   BP 122/70 mmHg  Pulse  112  Temp(Src) 100.1 F (37.8 C) (Rectal)  Resp 18  SpO2 94% Physical Exam  Constitutional: He appears well-developed and well-nourished. No distress.  HENT:  Head: Normocephalic and atraumatic.  Mouth/Throat: No oropharyngeal exudate.  Dry mucus membranes  Eyes: Conjunctivae and EOM are normal. Pupils are equal, round, and reactive to light. Right eye exhibits no discharge. Left eye exhibits no discharge.  Neck: Normal range of motion. Neck supple. No tracheal deviation present.  Cardiovascular: Normal rate, regular rhythm and normal heart sounds.  Exam reveals no friction rub.   No murmur heard. Pulmonary/Chest: Effort normal and breath sounds  normal. No respiratory distress. He has no wheezes. He has no rales.  Genitourinary: Guaiac negative stool.  Rectal exam: negative swelling, erythema, inflammation,lesions, sores, hemorrhoids noted. Negative BRBPR. Strong sphincter tone. Negative blood on glove. Brown stools noted on glove. Exam chaperoned with nurse, Abigail  Lymphadenopathy:    He has no cervical adenopathy.  Neurological: He is alert.  Cranial nerves grossly intact  Patient does not follow commands well - does not squeeze fingers when asked and does not bring finger to nose bilaterally - patient just has a blank stare at times  Skin: Skin is warm and dry. No rash noted. He is not diaphoretic. No erythema.  Psychiatric: He has a normal mood and affect. His behavior is normal. Thought content normal.  Nursing note and vitals reviewed.   ED Course  Procedures (including critical care time)  Results for orders placed or performed during the hospital encounter of 02/03/15  CBC with Differential  Result Value Ref Range   WBC 10.9 (H) 4.0 - 10.5 K/uL   RBC 2.40 (L) 4.22 - 5.81 MIL/uL   Hemoglobin 7.3 (L) 13.0 - 17.0 g/dL   HCT 16.1 (L) 09.6 - 04.5 %   MCV 99.2 78.0 - 100.0 fL   MCH 30.4 26.0 - 34.0 pg   MCHC 30.7 30.0 - 36.0 g/dL   RDW 40.9 81.1 - 91.4 %   Platelets 254 150 - 400 K/uL   Neutrophils Relative % 83 (H) 43 - 77 %   Neutro Abs 9.0 (H) 1.7 - 7.7 K/uL   Lymphocytes Relative 10 (L) 12 - 46 %   Lymphs Abs 1.1 0.7 - 4.0 K/uL   Monocytes Relative 7 3 - 12 %   Monocytes Absolute 0.7 0.1 - 1.0 K/uL   Eosinophils Relative 0 0 - 5 %   Eosinophils Absolute 0.0 0.0 - 0.7 K/uL   Basophils Relative 0 0 - 1 %   Basophils Absolute 0.0 0.0 - 0.1 K/uL  Comprehensive metabolic panel  Result Value Ref Range   Sodium 138 135 - 145 mmol/L   Potassium 5.2 (H) 3.5 - 5.1 mmol/L   Chloride 102 96 - 112 mmol/L   CO2 21 19 - 32 mmol/L   Glucose, Bld 150 (H) 70 - 99 mg/dL   BUN 64 (H) 6 - 23 mg/dL   Creatinine, Ser 7.82 (H)  0.50 - 1.35 mg/dL   Calcium 8.6 8.4 - 95.6 mg/dL   Total Protein 6.8 6.0 - 8.3 g/dL   Albumin 2.1 (L) 3.5 - 5.2 g/dL   AST 24 0 - 37 U/L   ALT 20 0 - 53 U/L   Alkaline Phosphatase 130 (H) 39 - 117 U/L   Total Bilirubin 0.3 0.3 - 1.2 mg/dL   GFR calc non Af Amer 13 (L) >90 mL/min   GFR calc Af Amer 15 (L) >90 mL/min   Anion  gap 15 5 - 15  Urinalysis, Routine w reflex microscopic  Result Value Ref Range   Color, Urine YELLOW YELLOW   APPearance CLOUDY (A) CLEAR   Specific Gravity, Urine 1.018 1.005 - 1.030   pH 5.5 5.0 - 8.0   Glucose, UA NEGATIVE NEGATIVE mg/dL   Hgb urine dipstick SMALL (A) NEGATIVE   Bilirubin Urine NEGATIVE NEGATIVE   Ketones, ur NEGATIVE NEGATIVE mg/dL   Protein, ur 161 (A) NEGATIVE mg/dL   Urobilinogen, UA 1.0 0.0 - 1.0 mg/dL   Nitrite NEGATIVE NEGATIVE   Leukocytes, UA NEGATIVE NEGATIVE  Ammonia  Result Value Ref Range   Ammonia 42 (H) 11 - 32 umol/L  Urine microscopic-add on  Result Value Ref Range   Squamous Epithelial / LPF RARE RARE   WBC, UA 3-6 <3 WBC/hpf   RBC / HPF 3-6 <3 RBC/hpf   Bacteria, UA MANY (A) RARE   Casts HYALINE CASTS (A) NEGATIVE   Urine-Other MUCOUS PRESENT   Urine Drug Screen  Result Value Ref Range   Opiates POSITIVE (A) NONE DETECTED   Cocaine NONE DETECTED NONE DETECTED   Benzodiazepines NONE DETECTED NONE DETECTED   Amphetamines NONE DETECTED NONE DETECTED   Tetrahydrocannabinol NONE DETECTED NONE DETECTED   Barbiturates NONE DETECTED NONE DETECTED  Protime-INR  Result Value Ref Range   Prothrombin Time 14.5 11.6 - 15.2 seconds   INR 1.11 0.00 - 1.49  APTT  Result Value Ref Range   aPTT 44 (H) 24 - 37 seconds  Troponin I  Result Value Ref Range   Troponin I 0.96 (HH) <0.031 ng/mL  Reticulocytes  Result Value Ref Range   Retic Ct Pct 1.0 0.4 - 3.1 %   RBC. 1.81 (L) 4.22 - 5.81 MIL/uL   Retic Count, Manual 18.1 (L) 19.0 - 186.0 K/uL  I-Stat Troponin, ED (not at [facility])  Result Value Ref Range   Troponin i, poc  0.53 (HH) 0.00 - 0.08 ng/mL   Comment NOTIFIED PHYSICIAN    Comment 3          POC occult blood, ED  Result Value Ref Range   Fecal Occult Bld NEGATIVE NEGATIVE  Type and screen for Red Blood Exchange  Result Value Ref Range   ABO/RH(D) O POS    Antibody Screen NEG    Sample Expiration 02/06/2015     Labs Review Labs Reviewed  CBC WITH DIFFERENTIAL/PLATELET - Abnormal; Notable for the following:    WBC 10.9 (*)    RBC 2.40 (*)    Hemoglobin 7.3 (*)    HCT 23.8 (*)    Neutrophils Relative % 83 (*)    Neutro Abs 9.0 (*)    Lymphocytes Relative 10 (*)    All other components within normal limits  COMPREHENSIVE METABOLIC PANEL - Abnormal; Notable for the following:    Potassium 5.2 (*)    Glucose, Bld 150 (*)    BUN 64 (*)    Creatinine, Ser 4.54 (*)    Albumin 2.1 (*)    Alkaline Phosphatase 130 (*)    GFR calc non Af Amer 13 (*)    GFR calc Af Amer 15 (*)    All other components within normal limits  URINALYSIS, ROUTINE W REFLEX MICROSCOPIC - Abnormal; Notable for the following:    APPearance CLOUDY (*)    Hgb urine dipstick SMALL (*)    Protein, ur 100 (*)    All other components within normal limits  AMMONIA - Abnormal; Notable for the following:  Ammonia 42 (*)    All other components within normal limits  URINE MICROSCOPIC-ADD ON - Abnormal; Notable for the following:    Bacteria, UA MANY (*)    Casts HYALINE CASTS (*)    All other components within normal limits  URINE RAPID DRUG SCREEN (HOSP PERFORMED) - Abnormal; Notable for the following:    Opiates POSITIVE (*)    All other components within normal limits  APTT - Abnormal; Notable for the following:    aPTT 44 (*)    All other components within normal limits  TROPONIN I - Abnormal; Notable for the following:    Troponin I 0.96 (*)    All other components within normal limits  RETICULOCYTES - Abnormal; Notable for the following:    RBC. 1.81 (*)    Retic Count, Manual 18.1 (*)    All other components  within normal limits  I-STAT TROPOININ, ED - Abnormal; Notable for the following:    Troponin i, poc 0.53 (*)    All other components within normal limits  URINE CULTURE  PROTIME-INR  ETHANOL  OCCULT BLOOD X 1 CARD TO LAB, STOOL  VITAMIN B12  FOLATE  IRON AND TIBC  FERRITIN  POC OCCULT BLOOD, ED  TYPE AND SCREEN  ABO/RH    Imaging Review Dg Chest 2 View  02/03/2015   CLINICAL DATA:  Weakness.  Altered mental status.  EXAM: CHEST  2 VIEW  COMPARISON:  Single view of the chest 01/19/2015.  FINDINGS: Lung volumes are low with mild basilar atelectasis. There is cardiomegaly without edema. No pneumothorax or pleural effusion.  IMPRESSION: No acute finding in a low volume chest.  Cardiomegaly.   Electronically Signed   By: Drusilla Kanner M.D.   On: 02/03/2015 16:37   Ct Head Wo Contrast  02/03/2015   CLINICAL DATA:  Mental status changes.  Schizophrenia.  EXAM: CT HEAD WITHOUT CONTRAST  TECHNIQUE: Contiguous axial images were obtained from the base of the skull through the vertex without intravenous contrast.  COMPARISON:  None.  FINDINGS: Sinuses/Soft tissues: Hypoplastic frontal sinuses. Other paranasal sinuses and mastoid air cells are clear.  Intracranial: Moderate low density in the periventricular white matter likely related to small vessel disease. Mild cerebral atrophy for age. Ventriculomegaly is mild and felt to be secondary to cerebral atrophy. Dense atherosclerosis in the bilateral vertebral and carotid arteries. No mass lesion, hemorrhage, acute infarct, intra-axial, or extra-axial fluid collection.  IMPRESSION: 1.  No acute intracranial abnormality. 2.  Cerebral atrophy and small vessel ischemic change. 3. Atherosclerosis.   Electronically Signed   By: Jeronimo Greaves M.D.   On: 02/03/2015 16:44   Mr Brain Wo Contrast (neuro Protocol)  02/03/2015   CLINICAL DATA:  Initial evaluation for acute onset confusion, altered mental status, history of schizophrenia.  EXAM: MRI HEAD WITHOUT  CONTRAST  TECHNIQUE: Multiplanar, multiecho pulse sequences of the brain and surrounding structures were obtained without intravenous contrast.  COMPARISON:  Prior CT from earlier the same day.  FINDINGS: Study is somewhat degraded by motion artifact.  Diffuse prominence of the CSF containing spaces is compatible with generalized cerebral atrophy. Patchy and confluent T2/FLAIR hyperintensity within the periventricular and deep white matter both cerebral hemispheres noted, nonspecific, but most likely related to chronic small vessel ischemic changes.  No abnormal foci of restricted diffusion to suggest acute intracranial infarct. Tiny mildly intense focus of signal intensity seen at the midline at the anterior genu of corpus callosum favored to be artifactual in nature (series  4, image 33). Gray-white matter differentiation maintained. Normal intravascular flow voids are present. Small remote lacunar infarct present within the right basal ganglia.  No acute or chronic intracranial hemorrhage identified.  No mass lesion or midline shift. Mild ventricular prominence related global parenchymal volume loss present without hydrocephalus. No extra-axial fluid collection.  Craniocervical junction within normal limits. Pituitary gland is normal. No acute abnormality seen about the orbits.  Paranasal sinuses are grossly clear. There is mild scattered fluid density within the right mastoid air cells.  Bone marrow signal intensity within normal limits. Scalp soft tissues unremarkable.  IMPRESSION: 1. Motion degraded study. No acute intracranial infarct or other process identified. 2. Atrophy with moderate chronic microvascular ischemic disease.   Electronically Signed   By: Rise Mu M.D.   On: 02/03/2015 21:33     EKG Interpretation   Date/Time:  Tuesday February 03 2015 16:16:52 EST Ventricular Rate:  90 PR Interval:  125 QRS Duration: 79 QT Interval:  323 QTC Calculation: 395 R Axis:   49 Text  Interpretation:  Sinus rhythm Ventricular premature complex Probable  left atrial enlargement Borderline T abnormalities, lateral leads No  significant change since last tracing Confirmed by Mirian Mo [phone])  on 02/03/2015 4:38:27 PM      6:06 PM This provider spoke with Dr. Roseanne Reno, on-call neurologist. Discussed case, labs, imaging, ED course, physical exam in great detail. Recommended MRI to be ordered - if positive transfer to [facility], if negative can stay at [facility].   6:19 PM Spoke with Dr. Allyson Sabal, Cardiologist.Cardiology will consult patient. Suspicion to be demand ischemia secondary to worsening kidney function.   6:46 PM This provider spoke with Dr. Mitchel Honour, [facility] - discussed case in great detail, labs, imaging, vitals, ED course. Patient to be admitted.   MDM   Final diagnoses:  Altered mental status, unspecified altered mental status type  Aphasia  Elevated troponin  Acute kidney injury  Hyperkalemia    Medications  0.9 %  sodium chloride infusion (not administered)  sodium chloride 0.9 % bolus 1,000 mL (0 mLs Intravenous Stopped 02/03/15 2001)  sodium chloride 0.9 % bolus 1,000 mL (1,000 mLs Intravenous New Bag/Given 02/03/15 2117)    Filed Vitals:   02/03/15 1634 02/03/15 1807 02/03/15 1835 02/03/15 2135  BP:  115/71  122/70  Pulse:  93  112  Temp: 98.9 F (37.2 C)  100.1 F (37.8 C)   TempSrc:   Rectal   Resp:  14  18  SpO2:  100%  94%   This provider reviewed the patient's chart. Patient was seen and assessed in ED setting on 01/13/2015 was admitted for acute renal injury. Last seen normal unknown-patient reported difficulty with speech approximately 2 days ago. EKG noted sinus rhythm with ventricular premature complexes with heart rate of 90 bpm-no significant change since last tracing. I-STAT troponin elevated at 0.53. Main lab troponin elevated 0.96. PT INR unremarkable. APTT 44. CBC noted mildly elevated leukocytosis of 10.9-1 compared  to 2 weeks ago patient's leukocytes was 8.3. Hemoglobin 7. 3 - when compared to 2 weeks ago patient's hemoglobin was 8.6. CMP elevated BUN of 64, Grant 4.54-acute renal injury identified. Mild hyperkalemia 5.2. When compared to 2 weeks ago, creatinine has increased to 3.34. Urinalysis noted small hemoglobin with negative nitrites or leukocytes. Many bacteria identified with white blood cell count 3-6. Urine drug screen positive for opiates. CT head without contrast noted acute intracranial abnormalities noted-triple atrophy and small vessel ischemic changes identified. Chest  x-ray noted acute finding in a low volume chest. Fecal occult negative. MRI of brain without contrast, motion degraded study, no acute intra-cranial infarct or other processes identified. Atrophy with moderate chronic micro-rest ischemic disease. Patient presenting to the emergency department with altered mental status-last seen normal unknown. CT head without contrast and MRI without contrast negative for acute intracranial abnormalities-negative findings of acute infarct. EKG unremarkable. Elevated troponin identified. Elevated creatinine BUN when compared to approximately 2 weeks ago-worsening acute kidney injury. Suspicion to be demand ischemia from worsening kidney function leading to elevated troponin levels. Hyperkalemia identified. Elevated ammonia level noted. Mildly low hemoglobin of 7.3, has decreased when compared to 2 weeks ago, guaiac negative. Neurology consulted and to assess patient regarding altered mental status. Patient to be admitted to the hospital under the care of [facility] - admitted to telemetry as inpatient.   Raymon MuttonMarissa Chanon Loney, PA-C 02/03/15 2147  Mirian MoMatthew Gentry, MD 02/06/15 [phone]

## 2015-02-03 NOTE — ED Notes (Signed)
[facility] at bedside. [facility]

## 2015-02-03 NOTE — ED Notes (Signed)
Bed: WA21 Expected date:  Expected time:  Means of arrival:  Comments: EMS-AMS 

## 2015-02-03 NOTE — ED Provider Notes (Signed)
Medical screening examination/treatment/procedure(s) were conducted as a shared visit with non-physician practitioner(s) and myself.  I personally evaluated the patient during the encounter.   EKG Interpretation   Date/Time:  Tuesday February 03 2015 16:16:52 EST Ventricular Rate:  90 PR Interval:  125 QRS Duration: 79 QT Interval:  323 QTC Calculation: 395 R Axis:   49 Text Interpretation:  Sinus rhythm Ventricular premature complex Probable  left atrial enlargement Borderline T abnormalities, lateral leads No  significant change since last tracing Confirmed by Mirian MoGentry, Matthew [phone]([id number])  on 02/03/2015 4:38:27 PM       Briefly, pt is a 64 y.o. male presenting with altered mental status in setting of schizophrenia.  He apparently was more confused than normal.  I performed an examination on the patient including cardiac, pulmonary, and gi systems which were unremarkable.  Pt denied pain at time of my exam.     Wu with elevated trop.  Admitted to medicine with cardiology and neuro consult.  Mirian MoMatthew Gentry, MD 02/04/15 1128

## 2015-02-03 NOTE — ED Notes (Signed)
Notified Dr. Littie DeedsGentry about Troponin

## 2015-02-03 NOTE — Consult Note (Signed)
Admission H&P    Chief Complaint: Altered mental status.  HPI: [name] Wise is an 64 y.o. male with a history of retention, gout, schizophrenia, renal failure and tobacco abuse, brought to the emergency room after he was noted that he had experienced a change in his mental status. Was noted that he had some problems with forming words and speech was somewhat slurred. Onset is unclear. He reportedly experienced a fall yesterday but did not hit his head. He is complaining of marked tenderness with movement of his lower extremities, right greater than left. He denied chest pain. However his troponin was elevated. BUN and creatinine will also higher than 2 weeks ago. His potassium was elevated at 5.2. He was not febrile. CT scan of his head showed no acute intracranial abnormality. There was concern that he may have experienced an acute stroke is of reduced handgrip, in addition to reduced movements of his lower extremities and change in speech. Urinalysis showed findings indicative of probable acute urinary tract infection.  Past Medical History  Diagnosis Date  . Gout   . Hypertension   . Schizophrenia   . Tobacco abuse     Past Surgical History  Procedure Laterality Date  . None      Family History  Problem Relation Age of Onset  . Hypertension Mother   . Gout Father    Social History:  reports that he has been smoking Cigarettes.  He does not have any smokeless tobacco history on file. He reports that he does not drink alcohol or use illicit drugs.  Allergies: No Known Allergies  Indications:  Patient's medications prior to admission were reviewed by me.  ROS: History obtained from chart review and the patient  General ROS: negative for - chills, fatigue, fever, night sweats, weight gain or weight loss Psychological ROS: negative for - behavioral disorder, hallucinations, memory difficulties, mood swings or suicidal ideation Ophthalmic ROS: negative for - blurry vision, double  vision, eye pain or loss of vision ENT ROS: negative for - epistaxis, nasal discharge, oral lesions, sore throat, tinnitus or vertigo Allergy and Immunology ROS: negative for - hives or itchy/watery eyes Hematological and Lymphatic ROS: negative for - bleeding problems, bruising or swollen lymph nodes Endocrine ROS: negative for - galactorrhea, hair pattern changes, polydipsia/polyuria or temperature intolerance Respiratory ROS: negative for - cough, hemoptysis, shortness of breath or wheezing Cardiovascular ROS: negative for - chest pain, dyspnea on exertion, edema or irregular heartbeat Gastrointestinal ROS: negative for - abdominal pain, diarrhea, hematemesis, nausea/vomiting or stool incontinence Genito-Urinary ROS: negative for - dysuria, hematuria, incontinence or urinary frequency/urgency Musculoskeletal ROS: negative for - joint swelling or muscular weakness Neurological ROS: as noted in HPI Dermatological ROS: negative for rash and skin lesion changes  Physical Examination: Blood pressure 115/71, pulse 93, temperature 98.9 F (37.2 C), temperature source Oral, resp. rate 14, SpO2 100 %.  HEENT-  Normocephalic, no lesions, without obvious abnormality.  Normal external eye and conjunctiva.  Normal TM's bilaterally.  Normal auditory canals and external ears. Normal external nose, mucus membranes and septum.  Normal pharynx. Neck supple with no masses, nodes, nodules or enlargement. Cardiovascular - regular rate and rhythm, S1, S2 normal, no murmur, click, rub or gallop Lungs - chest clear, no wheezing, rales, normal symmetric air entry, Heart exam - S1, S2 normal, no murmur, no gallop, rate regular Abdomen - soft, non-tender; bowel sounds normal; no masses,  no organomegaly Extremities - no edema  Neurologic Examination: Mental Status: Alert, oriented, thought content  appropriate.  Speech minimally slurred without evidence of aphasia. Able to follow commands without  difficulty. Cranial Nerves: II-Visual fields were normal. III/IV/VI-Pupils were equal and reacted normally to light. Extraocular movements were full and conjugate.    V/VII-no facial numbness and no facial weakness. VIII-normal. X-slightly slurred speech and symmetrical palatal movement. XI: trapezius strength/neck flexion strength normal bilaterally XII-midline tongue extension with normal strength. Motor: No drift of upper extremities; strength was equal proximally and distally; strength of lower extremities was difficult to assess because of severe pain with movement at hips and knees. Resting tremor of left lower extremity was noted. Sensory: Normal throughout. Deep Tendon Reflexes: Trace to 1+ and symmetric. Plantars: Mute bilaterally Cerebellar: Normal finger-to-nose testing. Carotid auscultation: Normal  Results for orders placed or performed during the hospital encounter of 02/03/15 (from the past 48 hour(s))  Urine Drug Screen     Status: Abnormal   Collection Time: 02/03/15  2:30 PM  Result Value Ref Range   Opiates POSITIVE (A) NONE DETECTED   Cocaine NONE DETECTED NONE DETECTED   Benzodiazepines NONE DETECTED NONE DETECTED   Amphetamines NONE DETECTED NONE DETECTED   Tetrahydrocannabinol NONE DETECTED NONE DETECTED   Barbiturates NONE DETECTED NONE DETECTED    Comment:        DRUG SCREEN FOR MEDICAL PURPOSES ONLY.  IF CONFIRMATION IS NEEDED FOR ANY PURPOSE, NOTIFY LAB WITHIN 5 DAYS.        LOWEST DETECTABLE LIMITS FOR URINE DRUG SCREEN Drug Class       Cutoff (ng/mL) Amphetamine      1000 Barbiturate      200 Benzodiazepine   329 Tricyclics       518 Opiates          300 Cocaine          300 THC              50   Urinalysis, Routine w reflex microscopic     Status: Abnormal   Collection Time: 02/03/15  2:34 PM  Result Value Ref Range   Color, Urine YELLOW YELLOW   APPearance CLOUDY (A) CLEAR   Specific Gravity, Urine 1.018 1.005 - 1.030   pH 5.5 5.0 - 8.0    Glucose, UA NEGATIVE NEGATIVE mg/dL   Hgb urine dipstick SMALL (A) NEGATIVE   Bilirubin Urine NEGATIVE NEGATIVE   Ketones, ur NEGATIVE NEGATIVE mg/dL   Protein, ur 100 (A) NEGATIVE mg/dL   Urobilinogen, UA 1.0 0.0 - 1.0 mg/dL   Nitrite NEGATIVE NEGATIVE   Leukocytes, UA NEGATIVE NEGATIVE  Urine microscopic-add on     Status: Abnormal   Collection Time: 02/03/15  2:34 PM  Result Value Ref Range   Squamous Epithelial / LPF RARE RARE   WBC, UA 3-6 <3 WBC/hpf   RBC / HPF 3-6 <3 RBC/hpf   Bacteria, UA MANY (A) RARE   Casts HYALINE CASTS (A) NEGATIVE   Urine-Other MUCOUS PRESENT     Comment: AMORPHOUS URATES/PHOSPHATES  CBC with Differential     Status: Abnormal   Collection Time: 02/03/15  3:00 PM  Result Value Ref Range   WBC 10.9 (H) 4.0 - 10.5 K/uL   RBC 2.40 (L) 4.22 - 5.81 MIL/uL   Hemoglobin 7.3 (L) 13.0 - 17.0 g/dL    Comment: REPEATED TO VERIFY   HCT 23.8 (L) 39.0 - 52.0 %   MCV 99.2 78.0 - 100.0 fL   MCH 30.4 26.0 - 34.0 pg   MCHC 30.7 30.0 - 36.0 g/dL  RDW 14.8 11.5 - 15.5 %   Platelets 254 150 - 400 K/uL   Neutrophils Relative % 83 (H) 43 - 77 %   Neutro Abs 9.0 (H) 1.7 - 7.7 K/uL   Lymphocytes Relative 10 (L) 12 - 46 %   Lymphs Abs 1.1 0.7 - 4.0 K/uL   Monocytes Relative 7 3 - 12 %   Monocytes Absolute 0.7 0.1 - 1.0 K/uL   Eosinophils Relative 0 0 - 5 %   Eosinophils Absolute 0.0 0.0 - 0.7 K/uL   Basophils Relative 0 0 - 1 %   Basophils Absolute 0.0 0.0 - 0.1 K/uL  Comprehensive metabolic panel     Status: Abnormal   Collection Time: 02/03/15  3:00 PM  Result Value Ref Range   Sodium 138 135 - 145 mmol/L   Potassium 5.2 (H) 3.5 - 5.1 mmol/L   Chloride 102 96 - 112 mmol/L   CO2 21 19 - 32 mmol/L   Glucose, Bld 150 (H) 70 - 99 mg/dL   BUN 64 (H) 6 - 23 mg/dL   Creatinine, Ser 4.54 (H) 0.50 - 1.35 mg/dL   Calcium 8.6 8.4 - 10.5 mg/dL   Total Protein 6.8 6.0 - 8.3 g/dL   Albumin 2.1 (L) 3.5 - 5.2 g/dL   AST 24 0 - 37 U/L   ALT 20 0 - 53 U/L   Alkaline  Phosphatase 130 (H) 39 - 117 U/L   Total Bilirubin 0.3 0.3 - 1.2 mg/dL   GFR calc non Af Amer 13 (L) >90 mL/min   GFR calc Af Amer 15 (L) >90 mL/min    Comment: (NOTE) The eGFR has been calculated using the CKD EPI equation. This calculation has not been validated in all clinical situations. eGFR's persistently <90 mL/min signify possible Chronic Kidney Disease.    Anion gap 15 5 - 15  Protime-INR     Status: None   Collection Time: 02/03/15  4:14 PM  Result Value Ref Range   Prothrombin Time 14.5 11.6 - 15.2 seconds   INR 1.11 0.00 - 1.49  APTT     Status: Abnormal   Collection Time: 02/03/15  4:14 PM  Result Value Ref Range   aPTT 44 (H) 24 - 37 seconds    Comment:        IF BASELINE aPTT IS ELEVATED, SUGGEST PATIENT RISK ASSESSMENT BE USED TO DETERMINE APPROPRIATE ANTICOAGULANT THERAPY.   I-Stat Troponin, ED (not at [facility])     Status: Abnormal   Collection Time: 02/03/15  4:27 PM  Result Value Ref Range   Troponin i, poc 0.53 (HH) 0.00 - 0.08 ng/mL   Comment NOTIFIED PHYSICIAN    Comment 3            Comment: Due to the release kinetics of cTnI, a negative result within the first hours of the onset of symptoms does not rule out myocardial infarction with certainty. If myocardial infarction is still suspected, repeat the test at appropriate intervals.   Troponin I     Status: Abnormal   Collection Time: 02/03/15  4:42 PM  Result Value Ref Range   Troponin I 0.96 (HH) <0.031 ng/mL    Comment:        POSSIBLE MYOCARDIAL ISCHEMIA. SERIAL TESTING RECOMMENDED. RESULT REPEATED AND VERIFIED CRITICAL RESULT CALLED TO, READ BACK BY AND VERIFIED WITH: QUICK,J AT 5701 ON 02/03/15 BY HOBBINS, J.   POC occult blood, ED     Status: None   Collection Time: 02/03/15  5:25 PM  Result Value Ref Range   Fecal Occult Bld NEGATIVE NEGATIVE   Dg Chest 2 View  02/03/2015   CLINICAL DATA:  Weakness.  Altered mental status.  EXAM: CHEST  2 VIEW  COMPARISON:  Single view of the chest  01/19/2015.  FINDINGS: Lung volumes are low with mild basilar atelectasis. There is cardiomegaly without edema. No pneumothorax or pleural effusion.  IMPRESSION: No acute finding in a low volume chest.  Cardiomegaly.   Electronically Signed   By: Inge Rise M.D.   On: 02/03/2015 16:37   Ct Head Wo Contrast  02/03/2015   CLINICAL DATA:  Mental status changes.  Schizophrenia.  EXAM: CT HEAD WITHOUT CONTRAST  TECHNIQUE: Contiguous axial images were obtained from the base of the skull through the vertex without intravenous contrast.  COMPARISON:  None.  FINDINGS: Sinuses/Soft tissues: Hypoplastic frontal sinuses. Other paranasal sinuses and mastoid air cells are clear.  Intracranial: Moderate low density in the periventricular white matter likely related to small vessel disease. Mild cerebral atrophy for age. Ventriculomegaly is mild and felt to be secondary to cerebral atrophy. Dense atherosclerosis in the bilateral vertebral and carotid arteries. No mass lesion, hemorrhage, acute infarct, intra-axial, or extra-axial fluid collection.  IMPRESSION: 1.  No acute intracranial abnormality. 2.  Cerebral atrophy and small vessel ischemic change. 3. Atherosclerosis.   Electronically Signed   By: Abigail Miyamoto M.D.   On: 02/03/2015 16:44    Assessment/Plan 64 year old man presenting with altered mental status of unclear etiology, in addition to generalized weakness and speech changes as described above. No clear focal neurologic deficits were noted on exam. Speech changes and generalized weakness may be associated with worsening kidney function and possible dehydration, as well as hyperkalemia and possible acute urinary tract infection. Acute stroke is less likely but cannot be completely ruled out.  Recommendations: 1. MRI of the brain without contrast to rule out acute stroke. 2. If MRI shows findings consistent with acute stroke, recommend stroke workup for risk assessment, including hemoglobin A1c, fasting  lipid panel, carotid Doppler study, 2-D echocardiogram. 3. Physical therapy and occupational therapy consults, regardless of MRI results 4. Have patient admitted to [facility] for stroke workup if MRI is positive for stroke. 5. Continue aspirin 325 mg per day.  We will continue to follow this patient with you.  C.R. Nicole Kindred, [place] [facility] [phone]  02/03/2015, 6:24 PM

## 2015-02-03 NOTE — ED Notes (Signed)
Upper dentures placed in pt's belonging bag

## 2015-02-03 NOTE — ED Notes (Signed)
Patient transported to MRI 

## 2015-02-03 NOTE — ED Notes (Signed)
Nurse drawing labs. 

## 2015-02-03 NOTE — ED Notes (Signed)
Pt is from [facility].  Was admitted there on Jan 20, 2015.   Per PTAR report, they were called out to transport pt to [facility] for a 1pm appt.  On arrival to [facility], the RN there said his appt was at 8am and the pt is not at his baseline mentally or physically.  Approx 1 month ago, pt was seen at [facility] for his schizophrenia and RN said he was ambulatory and mentally stable per his norm, that he went to get his RX from the pharmacy there on his own.  Staff at [facility] told EMS that pt had a fall on Feb 26, stated he fell from a standing position and that he was evaluated.  EMS was told his is immobile.  Pt  has severe pain to RT hip/leg when it is moved.  He is Pt can follow simple commands and oriented to self and birthday.  PTAR obtained a negative stroke screen en route.  126/76, 92, 20, 94% RA, CBG 193

## 2015-02-03 NOTE — ED Notes (Signed)
Patient is being transported to CT. 

## 2015-02-03 NOTE — H&P (Addendum)
[facility] History and Physical  [name] Wise ZOX:[id number] DOB: 1951/08/02 DOA: 02/03/2015  Referring physician: EDP PCP: Ralene Ok, MD   Chief Complaint: not acting right  HPI: [name] Wise is a 64 y.o. male of [place] descent with past medical history of paranoid schizophrenia, CKD 4, gout,  anemia, essential hypertension and tobacco use, [facility] was sent to the ER with the above complaint.   history is limited due to significant language barrier despite using translator services. He was recently discharged from [facility] on 2/16 after treatment for gout flare, AKI on CKD and demand ischemia. He reportedly goes to [facility] periodically for medication refill, he was sent there today and they noticed that he was not acting right, his speech was different,  thicker and slurred. He also fell yesterday at the facility. No other significant history obtained at this time. No recent medication changes, no fevers or chills, no seizures . Upon evaluation in the emergency room he had an unremarkable CT head but incidentally noted to have worsening anemia and creatinine. In addition to bone and found to be elevated as well patient currently denies any chest pain although he does complain of pain all over especially in his legs     Review of Systems:  unable to be obtained due to slurred speech and his inability to communicate with the translator Constitutional:  No weight loss, night sweats, Fevers, chills, fatigue.  HEENT:  No headaches, Difficulty swallowing,Tooth/dental problems,Sore throat,  No sneezing, itching, ear ache, nasal congestion, post nasal drip,  Cardio-vascular:  No chest pain, Orthopnea, PND, swelling in lower extremities, anasarca, dizziness, palpitations  GI:  No heartburn, indigestion, abdominal pain, nausea, vomiting, diarrhea, change in bowel habits, loss of appetite  Resp:  No  shortness of breath with exertion or at rest. No excess mucus, no productive cough, No non-productive cough, No coughing up of blood.No change in color of mucus.No wheezing.No chest wall deformity  Skin:  no rash or lesions.  GU:  no dysuria, change in color of urine, no urgency or frequency. No flank pain.  Musculoskeletal:  No joint pain or swelling. No decreased range of motion. No back pain.  Psych:  No change in mood or affect. No depression or anxiety. No memory loss.   Past Medical History  Diagnosis Date  . Gout   . Hypertension   . Schizophrenia   . Tobacco abuse    Past Surgical History  Procedure Laterality Date  . None     Social History:  reports that he has been smoking Cigarettes.  He does not have any smokeless tobacco history on file. He reports that he does not drink alcohol or use illicit drugs.  No Known Allergies  Family History  Problem Relation Age of Onset  . Hypertension Mother   . Gout Father     Prior to Admission medications   Medication Sig Start Date End Date Taking? Authorizing Provider  amLODipine (NORVASC) 10 MG tablet Take 10 mg by mouth daily.   Yes Historical Provider, MD  aspirin EC 325 MG EC tablet Take 1 tablet (325 mg total) by mouth daily. 01/20/15  Yes Costin Otelia Sergeant, MD  benztropine (COGENTIN) 1 MG tablet Take 1 tablet by mouth daily. 12/15/14  Yes Historical Provider, MD  ferrous sulfate 325 (65 FE) MG tablet Take 325 mg by mouth at bedtime.    Yes Historical Provider, MD  folic acid (FOLVITE) 1 MG  tablet Take 1 tablet (1 mg total) by mouth daily. 01/20/15  Yes Costin Otelia SergeantM Gherghe, MD  metoprolol tartrate (LOPRESSOR) 25 MG tablet Take 1 tablet (25 mg total) by mouth 2 (two) times daily. 01/20/15  Yes Costin Otelia SergeantM Gherghe, MD  oxyCODONE-acetaminophen (PERCOCET/ROXICET) 5-325 MG per tablet Take 1-2 tablets by mouth every 4 (four) hours as needed for moderate pain or severe pain. 01/20/15  Yes Costin Otelia SergeantM Gherghe, MD  sodium bicarbonate 650 MG  tablet Take 1,300 mg by mouth 2 (two) times daily.   Yes Historical Provider, MD  ferrous gluconate (FERGON) 324 MG tablet Take 1 tablet (324 mg total) by mouth daily with breakfast. Patient not taking: Reported on 02/03/2015 01/20/15   Leatha Gildingostin M Gherghe, MD  predniSONE (DELTASONE) 5 MG tablet Take 1 tablet (5 mg total) by mouth 2 (two) times daily. After 3 days take 1 tablet daily for 4 days then stop Patient not taking: Reported on 02/03/2015 01/20/15   Leatha Gildingostin M Gherghe, MD   Physical Exam: Filed Vitals:   02/03/15 1612 02/03/15 1634 02/03/15 1807 02/03/15 1835  BP: 116/69  115/71   Pulse: 90  93   Temp: 98.2 F (36.8 C) 98.9 F (37.2 C)  100.1 F (37.8 C)  TempSrc: Oral   Rectal  Resp: 14  14   SpO2: 94%  100%     Wt Readings from Last 3 Encounters:  01/21/15 59.421 kg (131 lb)  01/13/15 59.6 kg (131 lb 6.3 oz)  11/26/13 60.782 kg (134 lb)    General:  Appears calm, no distress, oriented to self and place only  Eyes: PERRL, normal lids, irises & conjunctiva ENT: grossly normal hearing, lips & tongue Neck: no LAD, masses or thyromegaly Cardiovascular: RRR, no m/r/g. No LE edema. Telemetry: SR, no arrhythmias  Respiratory: CTA bilaterally, no w/r/r. Normal respiratory effort. Abdomen: soft, ntnd Skin: no rash or induration seen on limited exam Musculoskeletal: grossly normal tone BUE/BLE, mild tenderness bilaterally over his lower legs  Psychiatric:appears to have depressed mood and flat affect, speech is thick and appears to be slurred Neurologic: exam limited due to inability to follow commands appropriately  Cranial nerves intact, motor -no localizing signs           Labs on Admission:  Basic Metabolic Panel:  Recent Labs Lab 02/03/15 1500  NA 138  K 5.2*  CL 102  CO2 21  GLUCOSE 150*  BUN 64*  CREATININE 4.54*  CALCIUM 8.6   Liver Function Tests:  Recent Labs Lab 02/03/15 1500  AST 24  ALT 20  ALKPHOS 130*  BILITOT 0.3  PROT 6.8  ALBUMIN 2.1*   No  results for input(s): LIPASE, AMYLASE in the last 168 hours.  Recent Labs Lab 02/03/15 1614  AMMONIA 42*   CBC:  Recent Labs Lab 02/03/15 1500  WBC 10.9*  NEUTROABS 9.0*  HGB 7.3*  HCT 23.8*  MCV 99.2  PLT 254   Cardiac Enzymes:  Recent Labs Lab 02/03/15 1642  TROPONINI 0.96*    BNP (last 3 results) No results for input(s): BNP in the last 8760 hours.  ProBNP (last 3 results) No results for input(s): PROBNP in the last 8760 hours.  CBG: No results for input(s): GLUCAP in the last 168 hours.  Radiological Exams on Admission: Dg Chest 2 View  02/03/2015   CLINICAL DATA:  Weakness.  Altered mental status.  EXAM: CHEST  2 VIEW  COMPARISON:  Single view of the chest 01/19/2015.  FINDINGS: Lung volumes are low  with mild basilar atelectasis. There is cardiomegaly without edema. No pneumothorax or pleural effusion.  IMPRESSION: No acute finding in a low volume chest.  Cardiomegaly.   Electronically Signed   By: Drusilla Kanner M.D.   On: 02/03/2015 16:37   Ct Head Wo Contrast  02/03/2015   CLINICAL DATA:  Mental status changes.  Schizophrenia.  EXAM: CT HEAD WITHOUT CONTRAST  TECHNIQUE: Contiguous axial images were obtained from the base of the skull through the vertex without intravenous contrast.  COMPARISON:  None.  FINDINGS: Sinuses/Soft tissues: Hypoplastic frontal sinuses. Other paranasal sinuses and mastoid air cells are clear.  Intracranial: Moderate low density in the periventricular white matter likely related to small vessel disease. Mild cerebral atrophy for age. Ventriculomegaly is mild and felt to be secondary to cerebral atrophy. Dense atherosclerosis in the bilateral vertebral and carotid arteries. No mass lesion, hemorrhage, acute infarct, intra-axial, or extra-axial fluid collection.  IMPRESSION: 1.  No acute intracranial abnormality. 2.  Cerebral atrophy and small vessel ischemic change. 3. Atherosclerosis.   Electronically Signed   By: Jeronimo Greaves M.D.   On:  02/03/2015 16:44    EKG: Independently reviewed. NSR, no acute ST t wave changes  Assessment/Plan   1.Slurred speech/encephalopathy -Onset of symptoms unclear, Suspect multifactorial etiology however need to rule out CVA - neurology consulted, recommended MRI and transferred corn only if MRI positive  - Aspirin Po or Rectal pending swallow screen  -Neurochecks, PT OT eval, speech eval -Hold narcotics which were started last admission pending improvement in mentation -Check ammonia  -Also check urine culture  -If above workup negative, may need psychiatric evaluation   2. AK I on CK D4 - creatinine worsened from the time of discharge, with mild hyperkalemia -Gentle hydration, hold bicarbonate pending swallows screen -Supposed to follow-up with Dr. Lowell Guitar at [facility] on 2/19; unclear if this happened; will need nonurgent renal consult if creatinine does not improve.  3. Anemia likely due to CK D and iron deficiency -Hemoccult stool negative, follow-up anemia panel  4. Elevated troponin -Suspect demand ischemia, cards consulted per EDP, continue aspirin, had a low-risk Lexi scan on 2/16 -change beta blocker to IV pending swallow screen  5. Paranoid schizophrenia -Holding Cogentin until swallow screen  6. Mild leukocytosis -etiology unclear, doubt UTI, check Urine Culture  Code Status: Presumed Full code DVT Prophylaxis: lovenox Family Communication: no family at bedside Disposition Plan: SNF when stable  Time spent:  [facility] Pager [phone]

## 2015-02-03 NOTE — Consult Note (Signed)
CARDIOLOGY CONSULT NOTE  Patient ID: [name] Wise MRN: [id number] DOB/AGE: 230-Dec-1952 64 y.o.  Admit date: 02/03/2015 Primary Physician Ralene OkMOREIRA,ROY, MD  Reason for Consultation: elevated troponin  HPI: 64 yr old male who was recently hospitalized for an acute gout flare and found to have CKD stage IV. During that hospitalization, he had sinus tachycardia and a troponin elevation. Cardiology was consulted and noninvasive cardiac testing was pursued. Echocardiography demonstrated normal LV systolic function and regional wall motion (EF 55-60%, moderate LVH, grade I diastolic dysfunction, mild mitral regurgitation). Nuclear MPI study was low risk with no ischemic territories (lateral and anteroseptal attenuation vs scar, normal wall motion, LVEF 60%). He was anemic and his mild troponin elevation appeared to be consistent with demand ischemia. Additional cardiovascular risk factors include essential hypertension and tobacco abuse. He also had an anemia with both iron and folate deficiencies.  At that time, it was recommended not to pursue an invasive evaluation due to the absence of chest pain, no ischemic changes on ECG, and because of poor renal function. He was started on ASA.  Unfortunately, the history is obtained from chart review as the patient has presented to the ED with altered mental status and essentially answers "yes" to all questions asked of him. He also has schizophrenia. Notable points from chart review are word forming difficulty and slurred speech as well as a fall yesterday. He did not complain of chest pain on presentation.  CT head did not reveal any acute abnormalities (cerebral atrophy with small vessel ischemic changes). UA indicative of UTI. Creatinine elevated at 4.54, troponin 0.96 (1.04 two weeks ago). WBC elevated, neutrophilia present, and Hgb low at 7.3.  ECG shows sinus rhythm, isolated PVC, and a mild nonspecific T wave abnormality in lateral leads. There are  no gross ischemic abnormalities. Telemetry currently demonstrates sinus tachycardia.     No Known Allergies  No current facility-administered medications for this encounter.   Current Outpatient Prescriptions  Medication Sig Dispense Refill  . amLODipine (NORVASC) 10 MG tablet Take 10 mg by mouth daily.    Marland Kitchen. aspirin EC 325 MG EC tablet Take 1 tablet (325 mg total) by mouth daily. 30 tablet 0  . benztropine (COGENTIN) 1 MG tablet Take 1 tablet by mouth daily.    . ferrous sulfate 325 (65 FE) MG tablet Take 325 mg by mouth at bedtime.     . folic acid (FOLVITE) 1 MG tablet Take 1 tablet (1 mg total) by mouth daily.    . metoprolol tartrate (LOPRESSOR) 25 MG tablet Take 1 tablet (25 mg total) by mouth 2 (two) times daily.    Marland Kitchen. oxyCODONE-acetaminophen (PERCOCET/ROXICET) 5-325 MG per tablet Take 1-2 tablets by mouth every 4 (four) hours as needed for moderate pain or severe pain. 30 tablet 0  . sodium bicarbonate 650 MG tablet Take 1,300 mg by mouth 2 (two) times daily.    . ferrous gluconate (FERGON) 324 MG tablet Take 1 tablet (324 mg total) by mouth daily with breakfast. (Patient not taking: Reported on 02/03/2015)  3  . predniSONE (DELTASONE) 5 MG tablet Take 1 tablet (5 mg total) by mouth 2 (two) times daily. After 3 days take 1 tablet daily for 4 days then stop (Patient not taking: Reported on 02/03/2015) 15 tablet 0    Past Medical History  Diagnosis Date  . Gout   . Hypertension   . Schizophrenia   . Tobacco abuse     Past Surgical History  Procedure Laterality Date  . None      History   Social History  . Marital Status: Divorced    Spouse Name: N/A  . Number of Children: N/A  . Years of Education: N/A   Occupational History  . Not on file.   Social History Main Topics  . Smoking status: Current Every Day Smoker    Types: Cigarettes  . Smokeless tobacco: Not on file  . Alcohol Use: No  . Drug Use: No  . Sexual Activity: Yes   Other Topics Concern  . Not on  file   Social History Narrative     Fam: Unable to be obtained due to altered mental status.  Prior to Admission medications   Medication Sig Start Date End Date Taking? Authorizing Provider  amLODipine (NORVASC) 10 MG tablet Take 10 mg by mouth daily.   Yes Historical Provider, MD  aspirin EC 325 MG EC tablet Take 1 tablet (325 mg total) by mouth daily. 01/20/15  Yes Costin Otelia Sergeant, MD  benztropine (COGENTIN) 1 MG tablet Take 1 tablet by mouth daily. 12/15/14  Yes Historical Provider, MD  ferrous sulfate 325 (65 FE) MG tablet Take 325 mg by mouth at bedtime.    Yes Historical Provider, MD  folic acid (FOLVITE) 1 MG tablet Take 1 tablet (1 mg total) by mouth daily. 01/20/15  Yes Costin Otelia Sergeant, MD  metoprolol tartrate (LOPRESSOR) 25 MG tablet Take 1 tablet (25 mg total) by mouth 2 (two) times daily. 01/20/15  Yes Costin Otelia Sergeant, MD  oxyCODONE-acetaminophen (PERCOCET/ROXICET) 5-325 MG per tablet Take 1-2 tablets by mouth every 4 (four) hours as needed for moderate pain or severe pain. 01/20/15  Yes Costin Otelia Sergeant, MD  sodium bicarbonate 650 MG tablet Take 1,300 mg by mouth 2 (two) times daily.   Yes Historical Provider, MD  ferrous gluconate (FERGON) 324 MG tablet Take 1 tablet (324 mg total) by mouth daily with breakfast. Patient not taking: Reported on 02/03/2015 01/20/15   Leatha Gilding, MD  predniSONE (DELTASONE) 5 MG tablet Take 1 tablet (5 mg total) by mouth 2 (two) times daily. After 3 days take 1 tablet daily for 4 days then stop Patient not taking: Reported on 02/03/2015 01/20/15   Leatha Gilding, MD     Review of systems complete and found to be negative unless listed above in HPI     Physical exam Blood pressure 115/71, pulse 93, temperature 100.1 F (37.8 C), temperature source Rectal, resp. rate 14, SpO2 100 %. General: Confused. Neck: No JVD, no thyromegaly or thyroid nodule.  Lungs: Clear to auscultation bilaterally with normal respiratory effort. CV: Nondisplaced  PMI. Mildly tachycardic, regular rhythm, normal S1/S2, no S3/S4, no murmur.  No peripheral edema.  No carotid bruit.  Normal pedal pulses.  Abdomen: Soft, nontender, no distention.  Skin: Intact without lesions or rashes.  Neurologic: Alert but confused.  Extremities: No clubbing or cyanosis.  HEENT: Normal.   ECG: Most recent ECG reviewed.  Labs:   Lab Results  Component Value Date   WBC 10.9* 02/03/2015   HGB 7.3* 02/03/2015   HCT 23.8* 02/03/2015   MCV 99.2 02/03/2015   PLT 254 02/03/2015    Recent Labs Lab 02/03/15 1500  NA 138  K 5.2*  CL 102  CO2 21  BUN 64*  CREATININE 4.54*  CALCIUM 8.6  PROT 6.8  BILITOT 0.3  ALKPHOS 130*  ALT 20  AST 24  GLUCOSE 150*   Lab Results  Component Value Date   TROPONINI 0.96* 02/03/2015   No results found for: CHOL No results found for: HDL No results found for: LDLCALC No results found for: TRIG No results found for: CHOLHDL No results found for: LDLDIRECT       Studies: Dg Chest 2 View  02/03/2015   CLINICAL DATA:  Weakness.  Altered mental status.  EXAM: CHEST  2 VIEW  COMPARISON:  Single view of the chest 01/19/2015.  FINDINGS: Lung volumes are low with mild basilar atelectasis. There is cardiomegaly without edema. No pneumothorax or pleural effusion.  IMPRESSION: No acute finding in a low volume chest.  Cardiomegaly.   Electronically Signed   By: Drusilla Kanner M.D.   On: 02/03/2015 16:37   Ct Head Wo Contrast  02/03/2015   CLINICAL DATA:  Mental status changes.  Schizophrenia.  EXAM: CT HEAD WITHOUT CONTRAST  TECHNIQUE: Contiguous axial images were obtained from the base of the skull through the vertex without intravenous contrast.  COMPARISON:  None.  FINDINGS: Sinuses/Soft tissues: Hypoplastic frontal sinuses. Other paranasal sinuses and mastoid air cells are clear.  Intracranial: Moderate low density in the periventricular white matter likely related to small vessel disease. Mild cerebral atrophy for age.  Ventriculomegaly is mild and felt to be secondary to cerebral atrophy. Dense atherosclerosis in the bilateral vertebral and carotid arteries. No mass lesion, hemorrhage, acute infarct, intra-axial, or extra-axial fluid collection.  IMPRESSION: 1.  No acute intracranial abnormality. 2.  Cerebral atrophy and small vessel ischemic change. 3. Atherosclerosis.   Electronically Signed   By: Jeronimo Greaves M.D.   On: 02/03/2015 16:44    ASSESSMENT AND PLAN:  1. Elevated troponin: Given Hgb 7.3 and probable UTI with dehydration and acute on chronic renal failure, this again likely represents demand ischemia and not an acute coronary syndrome. Given marked anemia, would hold ASA for now. Can continue metoprolol as BP tolerates. He has already undergone a low risk nuclear MPI study (details above) and echocardiogram demonstrated normal LV systolic function and regional wall motion. Hence, I do not recommend any further cardiac workup at this time. Should he demonstrate marked ischemic ECG abnormalities, additional cardiovascular studies may be obtained. That being said, he is a poor candidate at present for coronary angiography given the exceedingly high risk for the development of contrast-induced nephropathy. Also, should he require dual antiplatelet therapy were he to need intervention, his current Hgb level would be prohibitive. I recommend continued medical management at this time primarily for UTI.  2. Essential hypertension: Currently controlled on amlodipine.  3. UTI: Given abnormal UA, leukocytosis with neutrophilia, and sinus tachycardia, would currently focus on treating UTI with antibiotics and fluid repletion.  4. Anemia: Hgb 7.3, unclear source. Would need GI follow up. Renal failure with decreased erythropoietin production certainly contributing.  5. CKD stage IV: Creatinine above baseline. Will need IV fluids. Was to follow up with [facility] after last hospitalization, and I am uncertain if he  did.   Signed: Prentice Docker, M.D., F.A.C.C.  02/03/2015, 7:10 PM

## 2015-02-03 NOTE — ED Notes (Signed)
Patient transported to CT 

## 2015-02-04 ENCOUNTER — Encounter (HOSPITAL_COMMUNITY): Payer: Self-pay | Admitting: Emergency Medicine

## 2015-02-04 ENCOUNTER — Inpatient Hospital Stay (HOSPITAL_COMMUNITY): Payer: Medicare Other

## 2015-02-04 DIAGNOSIS — R4701 Aphasia: Secondary | ICD-10-CM

## 2015-02-04 DIAGNOSIS — J810 Acute pulmonary edema: Secondary | ICD-10-CM

## 2015-02-04 DIAGNOSIS — D509 Iron deficiency anemia, unspecified: Secondary | ICD-10-CM

## 2015-02-04 DIAGNOSIS — I10 Essential (primary) hypertension: Secondary | ICD-10-CM

## 2015-02-04 DIAGNOSIS — I340 Nonrheumatic mitral (valve) insufficiency: Secondary | ICD-10-CM

## 2015-02-04 LAB — VITAMIN B12: Vitamin B-12: 443 pg/mL (ref 211–911)

## 2015-02-04 LAB — LIPID PANEL
Cholesterol: 123 mg/dL (ref 0–200)
HDL: 18 mg/dL — ABNORMAL LOW (ref >39)
LDL Cholesterol: 78 mg/dL (ref 0–99)
TRIGLYCERIDES: 137 mg/dL (ref <150)
Total CHOL/HDL Ratio: 6.8 RATIO
VLDL: 27 mg/dL (ref 0–40)

## 2015-02-04 LAB — IRON AND TIBC
Iron: <10 ug/dL — ABNORMAL LOW (ref 42–165)
UIBC: 119 ug/dL — ABNORMAL LOW (ref 125–400)

## 2015-02-04 LAB — FERRITIN: Ferritin: 834 ng/mL — ABNORMAL HIGH (ref 22–322)

## 2015-02-04 LAB — MRSA PCR SCREENING: MRSA by PCR: NEGATIVE

## 2015-02-04 LAB — TSH: TSH: 0.748 u[IU]/mL (ref 0.350–4.500)

## 2015-02-04 LAB — FOLATE: Folate: >20 ng/mL

## 2015-02-04 LAB — GLUCOSE, CAPILLARY
GLUCOSE-CAPILLARY: 184 mg/dL — AB (ref 70–99)
Glucose-Capillary: 152 mg/dL — ABNORMAL HIGH (ref 70–99)

## 2015-02-04 LAB — ABO/RH: ABO/RH(D): O POS

## 2015-02-04 MED ORDER — FERROUS SULFATE 325 (65 FE) MG PO TABS
325.0000 mg | ORAL_TABLET | Freq: Three times a day (TID) | ORAL | Status: DC
  Administered 2015-02-04 – 2015-02-09 (×15): 325 mg via ORAL
  Filled 2015-02-04 (×15): qty 1

## 2015-02-04 MED ORDER — METOPROLOL TARTRATE 1 MG/ML IV SOLN
5.0000 mg | Freq: Four times a day (QID) | INTRAVENOUS | Status: DC
  Administered 2015-02-04 – 2015-02-07 (×15): 5 mg via INTRAVENOUS
  Filled 2015-02-04 (×15): qty 5

## 2015-02-04 MED ORDER — DEXTROSE-NACL 5-0.9 % IV SOLN
INTRAVENOUS | Status: DC
  Administered 2015-02-04 – 2015-02-05 (×3): via INTRAVENOUS

## 2015-02-04 MED ORDER — HYDROMORPHONE HCL 1 MG/ML IJ SOLN
0.5000 mg | INTRAMUSCULAR | Status: AC | PRN
  Administered 2015-02-04 (×2): 0.5 mg via INTRAVENOUS
  Filled 2015-02-04 (×2): qty 1

## 2015-02-04 MED ORDER — INSULIN ASPART 100 UNIT/ML ~~LOC~~ SOLN
0.0000 [IU] | Freq: Three times a day (TID) | SUBCUTANEOUS | Status: DC
  Administered 2015-02-04: 2 [IU] via SUBCUTANEOUS
  Administered 2015-02-05: 1 [IU] via SUBCUTANEOUS
  Administered 2015-02-05: 3 [IU] via SUBCUTANEOUS
  Administered 2015-02-06: 5 [IU] via SUBCUTANEOUS
  Administered 2015-02-06: 2 [IU] via SUBCUTANEOUS
  Administered 2015-02-06: 1 [IU] via SUBCUTANEOUS
  Administered 2015-02-07 – 2015-02-08 (×4): 2 [IU] via SUBCUTANEOUS
  Administered 2015-02-08: 3 [IU] via SUBCUTANEOUS
  Administered 2015-02-08: 7 [IU] via SUBCUTANEOUS

## 2015-02-04 NOTE — Progress Notes (Signed)
Moderate oral phase dysphagia;Severe oral phase dysphagia;Moderate pharyngeal phase dysphagia    Clinical impression  Moderately severe oropharyngeal dysphagia with sensorimotor deficits.  Pt did not aspirate any consistency tested - trace laryngeal penetration of thin noted that cleared with further swallows.  Severe delay in oral transiting (up to 12 seconds) noted with pt requiring verbal cues to swallow -  delay was worse with increased viscocity.  Piecemealing noted which was effective.  Pt pharyngeal swallow characterized by delay and weakness with resultant residuals without pt sensation.  CUED dry swallows effective to decrease residuals.    Recommend pt consume full liquid diet initially with strict aspiration precautions.  Using live video, educated pt to findings, recommendations.    Will follow for readiness for dietary advancement.  SLP set up oral suction in pt's room to use prn.  Hopeful for pt swallow to improve during acute stay given his good po tolerance during last admit and negative MRI/CT.            Donavan Burnetamara Kimberl Vig, MS [facility] SLP [phone]

## 2015-02-04 NOTE — Progress Notes (Signed)
Clinical Social Work Department BRIEF PSYCHOSOCIAL ASSESSMENT 02/04/2015  Patient:  [name] Wise,[name] Wise     Account Number:  [id number]     Admit date:  02/03/2015  Clinical Social Worker:  Orpah GreekFOLEY,Paz Winsett, LCSWA  Date/Time:  02/04/2015 11:11 AM  Referred by:  Physician  Date Referred:  02/04/2015 Referred for  Other - See comment   Other Referral:   [facility]   Interview type:  Other - See comment Other interview type:   patient's emergency contact/friend, [name] Wise (ph#: [phone]) via phone    PSYCHOSOCIAL DATA Living Status:  FACILITY Admitted from facility:  [facility] Level of care:  Skilled Nursing Facility Primary support name:  [name] Wise (friend) cell#: [phone] Primary support relationship to patient:  FRIEND Degree of support available:   good    CURRENT CONCERNS Current Concerns  Post-Acute Placement   Other Concerns:    SOCIAL WORK ASSESSMENT / PLAN CSW reviewed patient's chart & noted that [facility].   Assessment/plan status:  Information/[facility]erral to [facility] Other assessment/ plan:   Information/referral to community resources:   CSW completed FL2 and [facility], confirmed with [name] Wise @ SNF that they would be able to take patient back when stable.    PATIENT'S/FAMILY'S RESPONSE TO PLAN OF CARE: Patient's friend, [name] Wise informed CSW that she has applied for disability for patient & has been in touch [facility]. Awaiting PT evaluation to see if patient will need to return to SNF to finish rehab stay or if he could go to ALF at discharge.          [name] MaxinKelly Elmin Wiederholt, LCSW [facility] Clinical Social Worker cell #: [phone]

## 2015-02-04 NOTE — Progress Notes (Signed)
Echocardiogram 2D Echocardiogram has been performed.  [name] Wise, [name] Wise M 02/04/2015, 12:57 PM

## 2015-02-04 NOTE — Progress Notes (Signed)
[facility] PROGRESS NOTE  [name] Wise ZOX:[id number] DOB: Aug 19, 1951 DOA: 02/03/2015 PCP: Ralene Ok, MD  Assessment/Plan:   Altered mental status - Stroke work up negative - Vitamin B-12 and folate levels were normal limits - We'll obtain RPR, HIV, and TSH levels - Speech therapy evaluated and recommended modified barium swallow as such will order  Active Problems:   AKI (acute kidney injury) - Obtain renal ultrasound - could be prerenal etiology as such will continue to monitor    Essential hypertension - B blocker    Acute kidney injury -Baseline near 3.3, on last check 4.5    Paranoid schizophrenia -Continue home medication regimen    Elevated troponin -No further workup recommended by cardiology    Anemia -Looks like iron deficiency anemia based on lab work -[employer] add ferrous sulfate   Code Status: full Family Communication: no family at bedside  Disposition Plan: Pending improvement in condition   Consultants:  Cardiology  Neurology  Procedures:  None  Antibiotics:  None  HPI/Subjective: Pt has no new complaints. Answering questions appropriately  Objective: Filed Vitals:   02/04/15 1436  BP: 125/75  Pulse: 120  Temp: 98.9 F (37.2 C)  Resp: 18    Intake/Output Summary (Last 24 hours) at 02/04/15 1525 Last data filed at 02/04/15 1302  Gross per 24 hour  Intake   2340 ml  Output   1100 ml  Net   1240 ml   Filed Weights   02/03/15 2352  Weight: 59 kg (130 lb 1.1 oz)    Exam:   General:  Patient in no acute distress, alert and awake  Cardiovascular: Regular rate and rhythm, no murmurs or rubs  Respiratory: Here to auscultation bilaterally, no wheeze no rales  Abdomen: Soft, nondistended  Musculoskeletal: No cyanosis on limited exam   Data Reviewed: Basic Metabolic Panel:  Recent Labs Lab 02/03/15 1500  NA 138  K 5.2*  CL 102  CO2 21  GLUCOSE 150*  BUN 64*  CREATININE 4.54*  CALCIUM 8.6   Liver Function  Tests:  Recent Labs Lab 02/03/15 1500  AST 24  ALT 20  ALKPHOS 130*  BILITOT 0.3  PROT 6.8  ALBUMIN 2.1*   No results for input(s): LIPASE, AMYLASE in the last 168 hours.  Recent Labs Lab 02/03/15 1614  AMMONIA 42*   CBC:  Recent Labs Lab 02/03/15 1500  WBC 10.9*  NEUTROABS 9.0*  HGB 7.3*  HCT 23.8*  MCV 99.2  PLT 254   Cardiac Enzymes:  Recent Labs Lab 02/03/15 1642  TROPONINI 0.96*   BNP (last 3 results) No results for input(s): BNP in the last 8760 hours.  ProBNP (last 3 results) No results for input(s): PROBNP in the last 8760 hours.  CBG: No results for input(s): GLUCAP in the last 168 hours.  Recent Results (from the past 240 hour(s))  MRSA PCR Screening     Status: None   Collection Time: 02/04/15 12:34 AM  Result Value Ref Range Status   MRSA by PCR NEGATIVE NEGATIVE Final    Comment:        The GeneXpert MRSA Assay (FDA approved for NASAL specimens only), is one component of a comprehensive MRSA colonization surveillance program. It is not intended to diagnose MRSA infection nor to guide or monitor treatment for MRSA infections.      Studies: Dg Chest 2 View  02/03/2015   CLINICAL DATA:  Weakness.  Altered mental status.  EXAM: CHEST  2 VIEW  COMPARISON:  Single view  of the chest 01/19/2015.  FINDINGS: Lung volumes are low with mild basilar atelectasis. There is cardiomegaly without edema. No pneumothorax or pleural effusion.  IMPRESSION: No acute finding in a low volume chest.  Cardiomegaly.   Electronically Signed   By: Drusilla Kannerhomas  Dalessio M.D.   On: 02/03/2015 16:37   Ct Head Wo Contrast  02/03/2015   CLINICAL DATA:  Mental status changes.  Schizophrenia.  EXAM: CT HEAD WITHOUT CONTRAST  TECHNIQUE: Contiguous axial images were obtained from the base of the skull through the vertex without intravenous contrast.  COMPARISON:  None.  FINDINGS: Sinuses/Soft tissues: Hypoplastic frontal sinuses. Other paranasal sinuses and mastoid air cells are  clear.  Intracranial: Moderate low density in the periventricular white matter likely related to small vessel disease. Mild cerebral atrophy for age. Ventriculomegaly is mild and felt to be secondary to cerebral atrophy. Dense atherosclerosis in the bilateral vertebral and carotid arteries. No mass lesion, hemorrhage, acute infarct, intra-axial, or extra-axial fluid collection.  IMPRESSION: 1.  No acute intracranial abnormality. 2.  Cerebral atrophy and small vessel ischemic change. 3. Atherosclerosis.   Electronically Signed   By: Jeronimo GreavesKyle  Talbot M.D.   On: 02/03/2015 16:44   Mr Brain Wo Contrast (neuro Protocol)  02/03/2015   CLINICAL DATA:  Initial evaluation for acute onset confusion, altered mental status, history of schizophrenia.  EXAM: MRI HEAD WITHOUT CONTRAST  TECHNIQUE: Multiplanar, multiecho pulse sequences of the brain and surrounding structures were obtained without intravenous contrast.  COMPARISON:  Prior CT from earlier the same day.  FINDINGS: Study is somewhat degraded by motion artifact.  Diffuse prominence of the CSF containing spaces is compatible with generalized cerebral atrophy. Patchy and confluent T2/FLAIR hyperintensity within the periventricular and deep white matter both cerebral hemispheres noted, nonspecific, but most likely related to chronic small vessel ischemic changes.  No abnormal foci of restricted diffusion to suggest acute intracranial infarct. Tiny mildly intense focus of signal intensity seen at the midline at the anterior genu of corpus callosum favored to be artifactual in nature (series 4, image 33). Gray-white matter differentiation maintained. Normal intravascular flow voids are present. Small remote lacunar infarct present within the right basal ganglia.  No acute or chronic intracranial hemorrhage identified.  No mass lesion or midline shift. Mild ventricular prominence related global parenchymal volume loss present without hydrocephalus. No extra-axial fluid  collection.  Craniocervical junction within normal limits. Pituitary gland is normal. No acute abnormality seen about the orbits.  Paranasal sinuses are grossly clear. There is mild scattered fluid density within the right mastoid air cells.  Bone marrow signal intensity within normal limits. Scalp soft tissues unremarkable.  IMPRESSION: 1. Motion degraded study. No acute intracranial infarct or other process identified. 2. Atrophy with moderate chronic microvascular ischemic disease.   Electronically Signed   By: Rise MuBenjamin  McClintock M.D.   On: 02/03/2015 21:33    Scheduled Meds: . aspirin  325 mg Oral Daily   Or  . aspirin  300 mg Rectal Daily  . enoxaparin (LOVENOX) injection  30 mg Subcutaneous QHS  . folic acid  1 mg Oral Daily  . metoprolol  5 mg Intravenous 4 times per day   Continuous Infusions:    Time spent: > 35 minutes    Penny PiaVEGA, Mulan Adan  [facility] Pager [phone] If 7PM-7AM, please contact night-coverage at [email or web address], password [email or web address] 02/04/2015, 3:25 PM  LOS: 1 day

## 2015-02-04 NOTE — Progress Notes (Signed)
SUBJECTIVE:  Complains of knee pain and arm pain, per the nurse.  No report of chest pain.    OBJECTIVE:   Vitals:   Filed Vitals:   02/03/15 2300 02/03/15 2351 02/03/15 2352 02/04/15 0500  BP: 131/74 138/73  132/83  Pulse: 115     Temp:  99 F (37.2 C)  98.5 F (36.9 C)  TempSrc:  Oral  Oral  Resp: 13 22  19   Height:   5\' 6"  (1.676 m)   Weight:   130 lb 1.1 oz (59 kg)   SpO2: 94% 92%  93%   I&O's:   Intake/Output Summary (Last 24 hours) at 02/04/15 [id number] Last data filed at 02/04/15 0501  Gross per 24 hour  Intake   2340 ml  Output      0 ml  Net   2340 ml   TELEMETRY: Reviewed telemetry pt in NSR:     PHYSICAL EXAM General: frail Head:   Normal cephalic and atramatic  Lungs:   No wheezing. Heart:   HRRR S1 S2  No JVD.   Abdomen: abdomen soft and non-tender Msk:  Back normal,  Normal strength and tone for age. Extremities:  tr edema.   Neuro: Alert  Psych:  flataffect Skin: No rash   LABS: Basic Metabolic Panel:  Recent Labs  95/62/1301/12/20 1500  NA 138  K 5.2*  CL 102  CO2 21  GLUCOSE 150*  BUN 64*  CREATININE 4.54*  CALCIUM 8.6   Liver Function Tests:  Recent Labs  02/03/15 1500  AST 24  ALT 20  ALKPHOS 130*  BILITOT 0.3  PROT 6.8  ALBUMIN 2.1*   No results for input(s): LIPASE, AMYLASE in the last 72 hours. CBC:  Recent Labs  02/03/15 1500  WBC 10.9*  NEUTROABS 9.0*  HGB 7.3*  HCT 23.8*  MCV 99.2  PLT 254   Cardiac Enzymes:  Recent Labs  02/03/15 1642  TROPONINI 0.96*   BNP: Invalid input(s): POCBNP D-Dimer: No results for input(s): DDIMER in the last 72 hours. Hemoglobin A1C: No results for input(s): HGBA1C in the last 72 hours. Fasting Lipid Panel: No results for input(s): CHOL, HDL, LDLCALC, TRIG, CHOLHDL, LDLDIRECT in the last 72 hours. Thyroid Function Tests: No results for input(s): TSH, T4TOTAL, T3FREE, THYROIDAB in the last 72 hours.  Invalid input(s): FREET3 Anemia Panel:  Recent Labs  02/03/15 1945    VITAMINB12 443  FOLATE >20.0  FERRITIN 834*  TIBC Not calculated due to Iron <10.  IRON <10*  RETICCTPCT 1.0   Coag Panel:   Lab Results  Component Value Date   INR 1.11 02/03/2015    RADIOLOGY: Ct Abdomen Wo Contrast  01/17/2015   CLINICAL DATA:  Stage 4 chronic kidney disease.  EXAM: CT ABDOMEN WITHOUT CONTRAST  TECHNIQUE: Multidetector CT imaging of the abdomen was performed following the standard protocol without IV contrast.  COMPARISON:  None.  FINDINGS: Lower chest: Mild cardiomegaly noted. Tiny bilateral pleural effusions versus pleural thickening.  Hepatobiliary: No mass visualized on this non-contrast exam. Gallbladder is unremarkable.  Pancreas: No mass or inflammatory process visualized on this non-contrast exam.  Spleen:  Within normal limits in size.  Adrenal Glands/Kidneys: No adrenal mass identified. Mild bilateral renal parenchymal scarring and atrophy noted. Tiny punctate calcifications could be due to nonobstructing bilateral renal calculi versus vascular calcification. No evidence of hydronephrosis.  Stomach/Bowel/Peritoneum:  Unremarkable.  Vascular/Lymphatic: No pathologically enlarged lymph nodes identified. 3.1 cm infrarenal abdominal aortic aneurysm noted. No evidence of retroperitoneal  hemorrhage.  Other:  None.  Musculoskeletal:  No suspicious bone lesions identified.  IMPRESSION: Mild bilateral renal parenchymal scarring and atrophy. No evidence of hydronephrosis.  Tiny nonobstructing bilateral renal calculi versus vascular calcifications.  3.1 cm infrarenal abdominal aortic aneurysm. No evidence of aneurysm leak or rupture. Recommend followup by ultrasound in 3 years. This recommendation follows ACR consensus guidelines: White Paper of the ACR Incidental Findings Committee II on Vascular Findings. [place] Coll Radiol 2013; 10:789-794   Electronically Signed   By: Myles Rosenthal M.D.   On: 01/17/2015 16:23   Dg Chest 2 View  02/03/2015   CLINICAL DATA:  Weakness.  Altered  mental status.  EXAM: CHEST  2 VIEW  COMPARISON:  Single view of the chest 01/19/2015.  FINDINGS: Lung volumes are low with mild basilar atelectasis. There is cardiomegaly without edema. No pneumothorax or pleural effusion.  IMPRESSION: No acute finding in a low volume chest.  Cardiomegaly.   Electronically Signed   By: Drusilla Kanner M.D.   On: 02/03/2015 16:37   Dg Chest 2 View  01/13/2015   CLINICAL DATA:  Bilateral lower extremity pain  EXAM: CHEST  2 VIEW  COMPARISON:  None.  FINDINGS: Lungs are clear. Heart is upper normal in size with pulmonary vascularity within normal limits. No adenopathy. No bone lesions.  IMPRESSION: No edema or consolidation.   Electronically Signed   By: Bretta Bang III M.D.   On: 01/13/2015 09:57   Dg Knee 1-2 Views Right  01/17/2015   CLINICAL DATA:  Right knee pain and swelling for 2-3 days. No known injury. Gout.  EXAM: RIGHT KNEE - 1-2 VIEW  COMPARISON:  None.  FINDINGS: No evidence of acute fracture or dislocation. Diffuse soft tissue swelling is seen most severe in the suprasellar region. Small knee joint effusion cannot be excluded. No evidence joint space narrowing, osteophytosis, or periarticular erosions. Peripheral vascular calcification noted.  IMPRESSION: Diffuse soft tissue swelling. Small knee joint effusion cannot be excluded. No osseous abnormality identified.   Electronically Signed   By: Myles Rosenthal M.D.   On: 01/17/2015 14:03   Ct Head Wo Contrast  02/03/2015   CLINICAL DATA:  Mental status changes.  Schizophrenia.  EXAM: CT HEAD WITHOUT CONTRAST  TECHNIQUE: Contiguous axial images were obtained from the base of the skull through the vertex without intravenous contrast.  COMPARISON:  None.  FINDINGS: Sinuses/Soft tissues: Hypoplastic frontal sinuses. Other paranasal sinuses and mastoid air cells are clear.  Intracranial: Moderate low density in the periventricular white matter likely related to small vessel disease. Mild cerebral atrophy for age.  Ventriculomegaly is mild and felt to be secondary to cerebral atrophy. Dense atherosclerosis in the bilateral vertebral and carotid arteries. No mass lesion, hemorrhage, acute infarct, intra-axial, or extra-axial fluid collection.  IMPRESSION: 1.  No acute intracranial abnormality. 2.  Cerebral atrophy and small vessel ischemic change. 3. Atherosclerosis.   Electronically Signed   By: Jeronimo Greaves M.D.   On: 02/03/2015 16:44   Mr Brain Wo Contrast (neuro Protocol)  02/03/2015   CLINICAL DATA:  Initial evaluation for acute onset confusion, altered mental status, history of schizophrenia.  EXAM: MRI HEAD WITHOUT CONTRAST  TECHNIQUE: Multiplanar, multiecho pulse sequences of the brain and surrounding structures were obtained without intravenous contrast.  COMPARISON:  Prior CT from earlier the same day.  FINDINGS: Study is somewhat degraded by motion artifact.  Diffuse prominence of the CSF containing spaces is compatible with generalized cerebral atrophy. Patchy and confluent T2/FLAIR hyperintensity  within the periventricular and deep white matter both cerebral hemispheres noted, nonspecific, but most likely related to chronic small vessel ischemic changes.  No abnormal foci of restricted diffusion to suggest acute intracranial infarct. Tiny mildly intense focus of signal intensity seen at the midline at the anterior genu of corpus callosum favored to be artifactual in nature (series 4, image 33). Gray-white matter differentiation maintained. Normal intravascular flow voids are present. Small remote lacunar infarct present within the right basal ganglia.  No acute or chronic intracranial hemorrhage identified.  No mass lesion or midline shift. Mild ventricular prominence related global parenchymal volume loss present without hydrocephalus. No extra-axial fluid collection.  Craniocervical junction within normal limits. Pituitary gland is normal. No acute abnormality seen about the orbits.  Paranasal sinuses are  grossly clear. There is mild scattered fluid density within the right mastoid air cells.  Bone marrow signal intensity within normal limits. Scalp soft tissues unremarkable.  IMPRESSION: 1. Motion degraded study. No acute intracranial infarct or other process identified. 2. Atrophy with moderate chronic microvascular ischemic disease.   Electronically Signed   By: Rise Mu M.D.   On: 02/03/2015 21:33   Mr Maxine Glenn Abdomen Wo Contrast  01/19/2015   CLINICAL DATA:  Unexplained renal failure. Evaluate for renal artery stenosis. Stage 4 kidney disease with cortical thinning.  EXAM: MRA ABDOMEN WITHOUT CONTRAST  TECHNIQUE: Angiographic images of the chest were obtained using MRA technique without intravenous contrast.  CONTRAST:  None  COMPARISON:  CT abdomen pelvis - 01/17/2015 ; renal ultrasound - 01/15/2015  FINDINGS: The examination is degraded secondary to lack of intravenous contrast due to patient's renal insufficiency. Examination is further degraded secondary to patient's overlying left upper extremity  Vascular Findings:  Abdominal aorta: The abdominal aorta is suboptimally evaluated on this noncontrast examination. The external caliber of the abdominal aorta is unchanged, measuring approximately 3.0 cm in maximal oblique axial dimension (image 31, series 3).  Celiac artery: There is a minimal amount of eccentric atherosclerotic plaque involving the origin of the celiac artery, not definitely resulting in a hemodynamically significant stenosis. Conventional branching pattern.  SMA: There is a minimal amount of eccentric atherosclerotic plaque involving the origin of the SMA, not definitely resulting in a hemodynamically significant stenosis. The mid and distal aspects of the main trunk of the SMA are suboptimally evaluated.  Right Renal artery: Suboptimally evaluated. There is a apparent minimal amount of focal atherosclerotic plaque involving the origin of the right renal artery though evaluation for  hemodynamic significance is degraded secondary to suboptimal vessel enhancement.  Left Renal artery: There is nondiagnostic evaluation of the left renal artery secondary to suboptimal vessel opacification.  IMA: Not evaluated  Review of the MIP images confirms the above findings.   --------------------------------------------------------------------------------  Nonvascular Findings:  Bilateral renal cortical atrophy, left greater than right, appears grossly unchanged. No urinary obstruction or perinephric stranding.  Normal hepatic contour. Normal noncontrast appearance of the gallbladder. Normal noncontrast appearance of the bilateral adrenal glands, pancreas and spleen. Visualized loops of bowel appear normal. No retroperitoneal or mesenteric adenopathy within the imaged upper abdomen.  IMPRESSION: 1. Markedly degraded examination with nondiagnostic evaluation of the left renal artery. There is eccentric atherosclerotic plaque involving the origin of the right renal artery though evaluation for hemodynamic significance is degraded secondary to suboptimal opacification of the downstream right renal artery. If renal artery stenosis remains of clinical concern, further evaluation could be performed with Renal artery duplex ultrasound which if abnormal could be confirmed with  a CO2 angiogram. 2. Grossly unchanged approximately 3.0 cm infrarenal abdominal aortic aneurysm.   Electronically Signed   By: Simonne Come M.D.   On: 01/19/2015 08:09   US Renal  01/15/2015   CLINICAL DATA:  Initial evaluation for renal failure  EXAM: RENAL/URINARY TRACT ULTRASOUND COMPLETE  COMPARISON:  Renal ultrasound 10/03/2003  FINDINGS: Right Kidney:  Length: 10.0 cm. Significant cortical thinning. Increased echogenicity. No hydronephrosis.  Left Kidney:  Length: 9.5 cm. Significant cortical thinning. Increased echogenicity. No hydronephrosis.  Bladder:  Bladder is decompressed by Foley catheter.  IMPRESSION: Similar to prior study  there is significant cortical atrophy with evidence of medical renal disease but there are no acute findings.   Electronically Signed   By: Esperanza Heir M.D.   On: 01/15/2015 15:56   Nm Myocar Multi W/spect W/wall Motion / Ef  01/18/2015   CLINICAL DATA:  elevated troponins, tobacco abuse, HTN, GOUT, schizophrenia, bilateral leg pain.  EXAM: MYOCARDIAL IMAGING WITH SPECT (REST AND PHARMACOLOGIC-STRESS)  GATED LEFT VENTRICULAR WALL MOTION STUDY  LEFT VENTRICULAR EJECTION FRACTION  TECHNIQUE: Standard myocardial SPECT imaging was performed after resting intravenous injection of 10 mCi Tc-96m sestamibi. Subsequently, intravenous infusion of Lexiscan was performed under the supervision of the Cardiology staff. At peak effect of the drug, 30 mCi Tc-19m sestamibi was injected intravenously and standard myocardial SPECT imaging was performed. Quantitative gated imaging was also performed to evaluate left ventricular wall motion, and estimate left ventricular ejection fraction.  COMPARISON:  None.  FINDINGS: Perfusion: Mildly decreased activity in the anteroseptal region of the left ventricle and lateral wall, on both rest and stress sequences. No decreased activity in the left ventricle on stress imaging to suggest reversible ischemia or infarction. There is a moderate amount of subdiaphragmatic activity noted on both sequences.  Wall Motion: Normal left ventricular wall motion. No left ventricular dilation.  Left Ventricular Ejection Fraction: 60 %  End diastolic volume 18 ml  End systolic volume 45 ml  IMPRESSION: 1. No reversible ischemia. Lateral and anteroseptal attenuation versus scar.  2. Normal left ventricular wall motion.  3. Left ventricular ejection fraction 60%  4. Low-risk stress test findings*.  *2012 Appropriate Use Criteria for Coronary Revascularization Focused Update: J Am Coll Cardiol. 2012;59(9):857-881. http://content.dementiazones.com.aspx?articleid=1201161   Electronically Signed   By: Corlis Leak M.D.   On: 01/18/2015 14:26   Dg Chest Port 1 View  01/19/2015   CLINICAL DATA:  Sudden onset of fever.  EXAM: PORTABLE CHEST - 1 VIEW  COMPARISON:  01/13/2015  FINDINGS: A single AP portable view of the chest demonstrates no focal airspace consolidation or alveolar edema. The lungs are grossly clear. There is no large effusion or pneumothorax. Cardiac and mediastinal contours appear unremarkable.  There is no significant interval change.  IMPRESSION: No acute finding   Electronically Signed   By: Ellery Plunk M.D.   On: 01/19/2015 01:37   Dg Foot 2 Views Left  01/18/2015   CLINICAL DATA:  Diffuse pain. History of gout. No history of trauma  EXAM: LEFT FOOT - 2 VIEW  COMPARISON:  None.  FINDINGS: Frontal and lateral views were obtained. There is no demonstrable fracture or dislocation. There is mild erosive change in the first MTP joint. There is no other erosive change. There is soft tissue swelling dorsally. Other joint spaces appear intact. There is a minimal inferior calcaneal spur.  IMPRESSION: Erosive change first MTP joint. Suspect gout. No fracture or dislocation. Soft tissue swelling dorsally.   Electronically Signed  By: Bretta Bang III M.D.   On: 01/18/2015 17:15      ASSESSMENT: Roosvelt Harps:    1) Elevated troponin: Given renal failure and anemia, would not pursue invasive w/u.  Low risk stress test.  No sx. He has many comorbidities that prevent him from being a candidate for cardiac cath.  2) HTN: Stable.  Avoid ACE-I due to renal failure.   Will sign off.   Corky Crafts, MD  02/04/2015  7:14 AM

## 2015-02-04 NOTE — Evaluation (Signed)
Physical Therapy Evaluation Patient Details Name: [name] Wise MRN: [id number] DOB: 1951/06/20 Today's Date: 02/04/2015   History of Present Illness  64 y.o. male with a history of retention, gout, schizophrenia, renal failure and tobacco abuse, admitted with altered mental status of unclear etiology, generalized weakness and speech changes.    Clinical Impression  Pt admitted with above diagnosis. Pt currently with functional limitations due to the deficits listed below (see PT Problem List).  Pt will benefit from skilled PT to increase their independence and safety with mobility to allow discharge to the venue listed below.   Very limited evaluation due to increased pain with any movement especially R LE (mostly hip and knee), L knee, and bil elbows.  Pt unable to tolerate even bed mobility, RN notified.     Follow Up Recommendations SNF    Equipment Recommendations  Other (comment) (unknown at this time)    Recommendations for Other Services       Precautions / Restrictions Precautions Precautions: Fall      Mobility  Bed Mobility Overal bed mobility: Needs Assistance;+2 for physical assistance             General bed mobility comments: attempted bed mobility a few times however pt reluctant due to pain even with assist  Transfers                    Ambulation/Gait                Stairs            Wheelchair Mobility    Modified Rankin (Stroke Patients Only)       Balance                                             Pertinent Vitals/Pain Pain Assessment: Faces Faces Pain Scale: Hurts whole lot Pain Location: reports pain everywhere however most grimacing and guarding with LE movement  Pain Descriptors / Indicators: Guarding;Grimacing;Moaning Pain Intervention(s): Limited activity within patient's tolerance;Monitored during session (RN notified)    Home Living Family/patient expects to be discharged to:: Unsure                  Additional Comments: per chart review[facility], [facility]    Prior Function Level of Independence: Independent with assistive device(s)         Comments: per chart review pt ambulatory, uncertain of PLOF as pt with decreased cognition     Hand Dominance        Extremity/Trunk Assessment               Lower Extremity Assessment: RLE deficits/detail;LLE deficits/detail RLE Deficits / Details: pt grimaced and guarded any movement to R LE (passive), maintained increased external hip rotation and flexion, no active movement initiated LLE Deficits / Details: able to move ankle and perform quad set however also reluctant to move L LE passively     Communication   Communication: No difficulties  Cognition Arousal/Alertness: Awake/alert Behavior During Therapy: WFL for tasks assessed/performed Overall Cognitive Status: Difficult to assess (able to follow commands however appears confused) Area of Impairment: Orientation Orientation Level: Place;Time;Situation;Disoriented to                  General Comments      Exercises  Assessment/Plan    PT Assessment Patient needs continued PT services  PT Diagnosis Difficulty walking;Generalized weakness;Acute pain   PT Problem List Decreased activity tolerance;Decreased balance;Decreased mobility;Pain;Decreased knowledge of use of DME;Decreased strength  PT Treatment Interventions DME instruction;Gait training;Functional mobility training;Therapeutic activities;Therapeutic exercise;Patient/family education;Balance training   PT Goals (Current goals can be found in the Care Plan section) Acute Rehab PT Goals PT Goal Formulation: Patient unable to participate in goal setting Time For Goal Achievement: 02/18/15 Potential to Achieve Goals: Fair    Frequency Min 3X/week   Barriers to discharge        Co-evaluation               End of Session    Activity Tolerance: Patient tolerated treatment well Patient left: in bed;with call bell/phone within reach;with bed alarm set Nurse Communication: Mobility status (pain level)         Time: 8119-1478 PT Time Calculation (min) (ACUTE ONLY): 10 min   Charges:   PT Evaluation $Initial PT Evaluation Tier I: 1 Procedure     PT G Codes:        Katiana Ruland,KATHrine E 02/04/2015, 11:23 AM Zenovia Jarred, PT, DPT 02/04/2015 Pager: [phone]

## 2015-02-04 NOTE — Progress Notes (Signed)
OT Cancellation Note  Patient Details Name: [name] Wise MRN: [id number] DOB: [birth year]   Cancelled Treatment:    Reason Eval/Treat Not Completed: Other (comment).  Pt with gout pain per RN; refused PT.  Will check back.  [name] Wise 02/04/2015, 12:08 PM  Marica OtterMaryellen Vern Guerette, OTR/L [phone] 02/04/2015

## 2015-02-04 NOTE — Evaluation (Addendum)
Clinical/Bedside Swallow Evaluation Patient Details  Name: [name] Wise MRN: [id number] Date of Birth: 06-30-1951  Today's Date: 02/04/2015 Time: SLP Start Time (ACUTE ONLY): 0820 SLP Stop Time (ACUTE ONLY): 0845 SLP Time Calculation (min) (ACUTE ONLY): 25 min  Past Medical History:  Past Medical History  Diagnosis Date  . Gout   . Hypertension   . Schizophrenia   . Tobacco abuse    Past Surgical History:  Past Surgical History  Procedure Laterality Date  . None     HPI:  64 yo male adm to [facility] with AMS- suspected UTI.  Pt with elevated troponins, CKD stage IV, anemia.  PMH + for schizophrenia.  CT head negative, MRI negative, CXR negative.  Pt failed RNSSS and SLP evaluation ordered.     Assessment / Plan / Recommendation Clinical Impression  Pt currently demonstrates clinical indications of oropharyngeal dysphagia and concern for possible aspiration across consistencies.  His oral cavity was coated with dried secretions which SLP removed with toothette and warm water.   Pt willingly accepted po intake and was fully alert but demonstrated dysphagia.  Delayed oral transiting, suspected delayed pharyngeal swallow with multiple swallows noted.    Per SLP discussion with RN, pt was recently in hospital and at that time was able to speak and eat without difficulties.    Hopeful for dysphagia to be an acute change due to UTI that will resolve with treatment.    Recommend proceed with instrumental eval to determine if ANY po is appropriate at this time.      Aspiration Risk  Severe    Diet Recommendation NPO   Medication Administration: Via alternative means    Other  Recommendations Recommended Consults: MBS   Follow Up Recommendations    MBS today if MD approves   Frequency and Duration  (tbd)      Pertinent Vitals/Pain Low grade temperature    Swallow Study Prior Functional Status   see HHX    General Date of Onset: 02/04/15 HPI: 64 yo male adm to [facility] with AMS-  suspected UTI.  Pt with elevated troponins, CKD stage IV, anemia.  PMH + for schizophrenia.  CT head negative, MRI negative, CXR negative.  Pt failed RNSSS and SLP evaluation ordered.   Type of Study: Bedside swallow evaluation Diet Prior to this Study: NPO Temperature Spikes Noted: No Respiratory Status: Room air History of Recent Intubation: No Behavior/Cognition: Alert;Cooperative;Other (comment) (pt understands limited English) Oral Cavity - Dentition: Edentulous Self-Feeding Abilities: Total assist Patient Positioning: Upright in bed Baseline Vocal Quality: Clear Volitional Cough: Weak Volitional Swallow: Unable to elicit    Oral/Motor/Sensory Function Overall Oral Motor/Sensory Function:  (pt with generalized weakness, did not follow commands adequately, oral cavity full of dried secretions that SLP cleaned with toothette)   Ice Chips Ice chips: Not tested   Thin Liquid Thin Liquid: Impaired Presentation: Cup Oral Phase Impairments: Reduced lingual movement/coordination;Impaired anterior to posterior transit Oral Phase Functional Implications: Prolonged oral transit Pharyngeal  Phase Impairments: Suspected delayed Swallow;Multiple swallows;Throat Clearing - Immediate;Decreased hyoid-laryngeal movement    Nectar Thick Nectar Thick Liquid: Impaired Oral Phase Impairments: Reduced lingual movement/coordination;Impaired anterior to posterior transit Oral phase functional implications: Prolonged oral transit Pharyngeal Phase Impairments: Suspected delayed Swallow;Multiple swallows;Cough - Delayed;Decreased hyoid-laryngeal movement   Honey Thick Honey Thick Liquid: Not tested   Puree Puree: Impaired Presentation: Spoon Oral Phase Impairments: Reduced lingual movement/coordination;Impaired anterior to posterior transit Oral Phase Functional Implications: Prolonged oral transit Pharyngeal Phase Impairments: Suspected delayed Swallow;Decreased hyoid-laryngeal movement;Multiple  swallows;Cough - Delayed   Solid   GO    Solid: Not tested       Donavan Burnetamara Lyniah Fujita, MS [facility] SLP [phone]

## 2015-02-04 NOTE — Progress Notes (Signed)
VASCULAR LAB PRELIMINARY  PRELIMINARY  PRELIMINARY  PRELIMINARY  Carotid duplex completed.    Preliminary report:  Bilateral:  1-39% ICA stenosis.  Vertebral artery flow is antegrade.     Kavir Savoca, RVS 02/04/2015, 4:22 PM

## 2015-02-05 ENCOUNTER — Inpatient Hospital Stay (HOSPITAL_COMMUNITY): Payer: Medicare Other

## 2015-02-05 LAB — URINALYSIS W MICROSCOPIC (NOT AT ARMC)
BILIRUBIN URINE: NEGATIVE
Glucose, UA: NEGATIVE mg/dL
Ketones, ur: NEGATIVE mg/dL
Leukocytes, UA: NEGATIVE
NITRITE: NEGATIVE
Protein, ur: 100 mg/dL — AB
Specific Gravity, Urine: 1.012 (ref 1.005–1.030)
UROBILINOGEN UA: 1 mg/dL (ref 0.0–1.0)
pH: 6.5 (ref 5.0–8.0)

## 2015-02-05 LAB — LACTIC ACID, PLASMA: Lactic Acid, Venous: 1.7 mmol/L (ref 0.5–2.0)

## 2015-02-05 LAB — CBC WITH DIFFERENTIAL/PLATELET
BASOS PCT: 0 % (ref 0–1)
Basophils Absolute: 0 10*3/uL (ref 0.0–0.1)
Eosinophils Absolute: 0.1 10*3/uL (ref 0.0–0.7)
Eosinophils Relative: 1 % (ref 0–5)
HCT: 22.4 % — ABNORMAL LOW (ref 39.0–52.0)
Hemoglobin: 6.9 g/dL — CL (ref 13.0–17.0)
Lymphocytes Relative: 6 % — ABNORMAL LOW (ref 12–46)
Lymphs Abs: 0.7 10*3/uL (ref 0.7–4.0)
MCH: 30.4 pg (ref 26.0–34.0)
MCHC: 30.8 g/dL (ref 30.0–36.0)
MCV: 98.7 fL (ref 78.0–100.0)
MONO ABS: 0.6 10*3/uL (ref 0.1–1.0)
Monocytes Relative: 5 % (ref 3–12)
NEUTROS ABS: 10.2 10*3/uL — AB (ref 1.7–7.7)
Neutrophils Relative %: 88 % — ABNORMAL HIGH (ref 43–77)
PLATELETS: 243 10*3/uL (ref 150–400)
RBC: 2.27 MIL/uL — AB (ref 4.22–5.81)
RDW: 15.1 % (ref 11.5–15.5)
WBC: 11.5 10*3/uL — ABNORMAL HIGH (ref 4.0–10.5)

## 2015-02-05 LAB — URINE CULTURE
Colony Count: NO GROWTH
Culture: NO GROWTH

## 2015-02-05 LAB — GLUCOSE, CAPILLARY
GLUCOSE-CAPILLARY: 182 mg/dL — AB (ref 70–99)
GLUCOSE-CAPILLARY: 109 mg/dL — AB (ref 70–99)
GLUCOSE-CAPILLARY: 132 mg/dL — AB (ref 70–99)
Glucose-Capillary: 205 mg/dL — ABNORMAL HIGH (ref 70–99)
Glucose-Capillary: 126 mg/dL — ABNORMAL HIGH (ref 70–99)
Glucose-Capillary: 130 mg/dL — ABNORMAL HIGH (ref 70–99)

## 2015-02-05 LAB — BASIC METABOLIC PANEL
ANION GAP: 10 (ref 5–15)
BUN: 52 mg/dL — ABNORMAL HIGH (ref 6–23)
CHLORIDE: 110 mmol/L (ref 96–112)
CO2: 17 mmol/L — AB (ref 19–32)
CREATININE: 3.36 mg/dL — AB (ref 0.50–1.35)
Calcium: 8.3 mg/dL — ABNORMAL LOW (ref 8.4–10.5)
GFR calc Af Amer: 21 mL/min — ABNORMAL LOW (ref >90)
GFR calc non Af Amer: 18 mL/min — ABNORMAL LOW (ref >90)
Glucose, Bld: 213 mg/dL — ABNORMAL HIGH (ref 70–99)
Potassium: 4.4 mmol/L (ref 3.5–5.1)
SODIUM: 137 mmol/L (ref 135–145)

## 2015-02-05 LAB — PREPARE RBC (CROSSMATCH)

## 2015-02-05 LAB — HEMOGLOBIN A1C
Hgb A1c MFr Bld: 6.9 % — ABNORMAL HIGH (ref 4.8–5.6)
Mean Plasma Glucose: 151 mg/dL

## 2015-02-05 LAB — RPR: RPR Ser Ql: NONREACTIVE

## 2015-02-05 LAB — HIV ANTIBODY (ROUTINE TESTING W REFLEX): HIV SCREEN 4TH GENERATION: NONREACTIVE

## 2015-02-05 MED ORDER — VANCOMYCIN HCL IN DEXTROSE 750-5 MG/150ML-% IV SOLN
750.0000 mg | INTRAVENOUS | Status: DC
  Administered 2015-02-05 – 2015-02-07 (×2): 750 mg via INTRAVENOUS
  Filled 2015-02-05 (×2): qty 150

## 2015-02-05 MED ORDER — SODIUM CHLORIDE 0.9 % IV BOLUS (SEPSIS)
250.0000 mL | Freq: Once | INTRAVENOUS | Status: AC
  Administered 2015-02-05: 250 mL via INTRAVENOUS

## 2015-02-05 MED ORDER — FUROSEMIDE 10 MG/ML IJ SOLN
40.0000 mg | Freq: Once | INTRAMUSCULAR | Status: AC
  Administered 2015-02-05: 40 mg via INTRAVENOUS
  Filled 2015-02-05: qty 4

## 2015-02-05 MED ORDER — SODIUM CHLORIDE 0.9 % IV SOLN
Freq: Once | INTRAVENOUS | Status: AC
  Administered 2015-02-05: 17:00:00 via INTRAVENOUS

## 2015-02-05 MED ORDER — ACETAMINOPHEN 650 MG RE SUPP: 650.0000 mg | Freq: Four times a day (QID) | RECTAL | Status: DC | PRN

## 2015-02-05 MED ORDER — ACETAMINOPHEN 325 MG PO TABS
650.0000 mg | ORAL_TABLET | Freq: Four times a day (QID) | ORAL | Status: DC | PRN
  Administered 2015-02-05 – 2015-02-08 (×4): 650 mg via ORAL
  Filled 2015-02-05 (×4): qty 2

## 2015-02-05 MED ORDER — PIPERACILLIN-TAZOBACTAM IN DEX 2-0.25 GM/50ML IV SOLN
2.2500 g | Freq: Three times a day (TID) | INTRAVENOUS | Status: DC
  Administered 2015-02-05 – 2015-02-08 (×9): 2.25 g via INTRAVENOUS
  Filled 2015-02-05 (×10): qty 50

## 2015-02-05 NOTE — Progress Notes (Signed)
[facility] PROGRESS NOTE  [name] CorrenteChay Wise ZOX:[id number] DOB: 09/28/1951 DOA: 02/03/2015 PCP: Ralene OkMOREIRA,ROY, MD  Assessment/Plan:   Altered mental status - Stroke work up negative - Patient did meet SIRS criteria, if patient has occult infection this may be contributing to altered mental status. Will obtain urine culture and blood culture and place on broad spectrum antibiotics - Vitamin B-12 and folate levels were normal limits - RPR nonreactive, HIV nonreactive, TSH within normal limits - Speech therapy evaluated and recommended modified barium swallow. Patient subsequently placed on full liquid diet  Iron deficiency anemia - Given cardiac history and current anemia plan to maintain hemoglobin levels above 8.0. - We'll continue iron supplementation  Pulmonary Edema - Patient had echocardiogram which reported normal EF but did report a grade 1 diastolic dysfunction  Active Problems:   AKI (acute kidney injury) - Renal ultrasound reviewed with no reported hydronephrosis or gross evidence of renal mass - Most likely prerenal etiology. Patient currently back at baseline at 3.3    Essential hypertension - B blocker on board, blood pressure relatively well controlled currently    Paranoid schizophrenia -Continue home medication regimen    Elevated troponin -No further workup recommended by cardiology   Code Status: full Family Communication: no family at bedside  Disposition Plan: Pending improvement in condition   Consultants:  Cardiology  Neurology  Procedures:  None  Antibiotics:  None  HPI/Subjective: Pt has no new complaints. Answering questions appropriately. Nursing did report that they were having difficulty placing IV. I discussed with them that I would like to avoid a PICC line since those are associated with increased risk of DVT  Objective: Filed Vitals:   02/05/15 1300  BP: 134/81  Pulse: 108  Temp: 98.1 F (36.7 C)  Resp: 18    Intake/Output  Summary (Last 24 hours) at 02/05/15 1551 Last data filed at 02/05/15 0600  Gross per 24 hour  Intake 2383.75 ml  Output    275 ml  Net 2108.75 ml   Filed Weights   02/03/15 2352  Weight: 59 kg (130 lb 1.1 oz)    Exam:   General:  Patient in no acute distress, alert and awake  Cardiovascular: Regular rate and rhythm, no murmurs or rubs  Respiratory: Clear to auscultation bilaterally, no wheeze no rales  Abdomen: Soft, nondistended  Musculoskeletal: No cyanosis on limited exam   Data Reviewed: Basic Metabolic Panel:  Recent Labs Lab 02/03/15 1500 02/05/15 0517  NA 138 137  K 5.2* 4.4  CL 102 110  CO2 21 17*  GLUCOSE 150* 213*  BUN 64* 52*  CREATININE 4.54* 3.36*  CALCIUM 8.6 8.3*   Liver Function Tests:  Recent Labs Lab 02/03/15 1500  AST 24  ALT 20  ALKPHOS 130*  BILITOT 0.3  PROT 6.8  ALBUMIN 2.1*   No results for input(s): LIPASE, AMYLASE in the last 168 hours.  Recent Labs Lab 02/03/15 1614  AMMONIA 42*   CBC:  Recent Labs Lab 02/03/15 1500 02/05/15 0517  WBC 10.9* 11.5*  NEUTROABS 9.0* 10.2*  HGB 7.3* 6.9*  HCT 23.8* 22.4*  MCV 99.2 98.7  PLT 254 243   Cardiac Enzymes:  Recent Labs Lab 02/03/15 1642  TROPONINI 0.96*   BNP (last 3 results) No results for input(s): BNP in the last 8760 hours.  ProBNP (last 3 results) No results for input(s): PROBNP in the last 8760 hours.  CBG:  Recent Labs Lab 02/04/15 1705 02/04/15 2324 02/05/15 0523 02/05/15 0826 02/05/15 1209  GLUCAP  184* 152* 182* 205* 109*    Recent Results (from the past 240 hour(s))  Culture, Urine     Status: None   Collection Time: 02/03/15  9:00 PM  Result Value Ref Range Status   Specimen Description URINE, RANDOM  Final   Special Requests NONE  Final   Colony Count NO GROWTH Performed at [facility]   Final   Culture NO GROWTH Performed at [facility]   Final   Report Status 02/05/2015 FINAL  Final  MRSA PCR Screening      Status: None   Collection Time: 02/04/15 12:34 AM  Result Value Ref Range Status   MRSA by PCR NEGATIVE NEGATIVE Final    Comment:        The GeneXpert MRSA Assay (FDA approved for NASAL specimens only), is one component of a comprehensive MRSA colonization surveillance program. It is not intended to diagnose MRSA infection nor to guide or monitor treatment for MRSA infections.      Studies: Dg Chest 2 View  02/03/2015   CLINICAL DATA:  Weakness.  Altered mental status.  EXAM: CHEST  2 VIEW  COMPARISON:  Single view of the chest 01/19/2015.  FINDINGS: Lung volumes are low with mild basilar atelectasis. There is cardiomegaly without edema. No pneumothorax or pleural effusion.  IMPRESSION: No acute finding in a low volume chest.  Cardiomegaly.   Electronically Signed   By: Drusilla Kanner M.D.   On: 02/03/2015 16:37   Ct Head Wo Contrast  02/03/2015   CLINICAL DATA:  Mental status changes.  Schizophrenia.  EXAM: CT HEAD WITHOUT CONTRAST  TECHNIQUE: Contiguous axial images were obtained from the base of the skull through the vertex without intravenous contrast.  COMPARISON:  None.  FINDINGS: Sinuses/Soft tissues: Hypoplastic frontal sinuses. Other paranasal sinuses and mastoid air cells are clear.  Intracranial: Moderate low density in the periventricular white matter likely related to small vessel disease. Mild cerebral atrophy for age. Ventriculomegaly is mild and felt to be secondary to cerebral atrophy. Dense atherosclerosis in the bilateral vertebral and carotid arteries. No mass lesion, hemorrhage, acute infarct, intra-axial, or extra-axial fluid collection.  IMPRESSION: 1.  No acute intracranial abnormality. 2.  Cerebral atrophy and small vessel ischemic change. 3. Atherosclerosis.   Electronically Signed   By: Jeronimo Greaves M.D.   On: 02/03/2015 16:44   Mr Brain Wo Contrast (neuro Protocol)  02/03/2015   CLINICAL DATA:  Initial evaluation for acute onset confusion, altered mental status,  history of schizophrenia.  EXAM: MRI HEAD WITHOUT CONTRAST  TECHNIQUE: Multiplanar, multiecho pulse sequences of the brain and surrounding structures were obtained without intravenous contrast.  COMPARISON:  Prior CT from earlier the same day.  FINDINGS: Study is somewhat degraded by motion artifact.  Diffuse prominence of the CSF containing spaces is compatible with generalized cerebral atrophy. Patchy and confluent T2/FLAIR hyperintensity within the periventricular and deep white matter both cerebral hemispheres noted, nonspecific, but most likely related to chronic small vessel ischemic changes.  No abnormal foci of restricted diffusion to suggest acute intracranial infarct. Tiny mildly intense focus of signal intensity seen at the midline at the anterior genu of corpus callosum favored to be artifactual in nature (series 4, image 33). Gray-white matter differentiation maintained. Normal intravascular flow voids are present. Small remote lacunar infarct present within the right basal ganglia.  No acute or chronic intracranial hemorrhage identified.  No mass lesion or midline shift. Mild ventricular prominence related global parenchymal volume loss present without hydrocephalus.  No extra-axial fluid collection.  Craniocervical junction within normal limits. Pituitary gland is normal. No acute abnormality seen about the orbits.  Paranasal sinuses are grossly clear. There is mild scattered fluid density within the right mastoid air cells.  Bone marrow signal intensity within normal limits. Scalp soft tissues unremarkable.  IMPRESSION: 1. Motion degraded study. No acute intracranial infarct or other process identified. 2. Atrophy with moderate chronic microvascular ischemic disease.   Electronically Signed   By: Rise Mu M.D.   On: 02/03/2015 21:33   US Renal  02/04/2015   CLINICAL DATA:  Acute on chronic renal failure, history smoking, hypertension, altered mental status  EXAM: RENAL/URINARY TRACT  ULTRASOUND COMPLETE  COMPARISON:  CT abdomen and pelvis 01/17/2015, renal ultrasound 01/15/2015  FINDINGS: Right Kidney:  Length: 10.0 cm. Cortical thinning. Increased cortical echogenicity. No mass, hydronephrosis or shadowing calcification.  Left Kidney:  Length: 10.1 cm. Cortical thinning. Increased cortical echogenicity. No mass, hydronephrosis or shadowing calcification.  Bladder:  Slightly irregular posterior bladder wall favor trabeculation. Bladder normally distended without definite mass. Prostate gland not visualized.  IMPRESSION: Medical renal disease changes and cortical atrophy of both kidneys.  No gross evidence of renal mass or hydronephrosis.  Question trabeculation of bladder wall, which may be seen with chronic outlet obstruction.   Electronically Signed   By: Ulyses Southward M.D.   On: 02/04/2015 20:15   Dg Chest Port 1 View  02/05/2015   CLINICAL DATA:  Fever and chest crackles  EXAM: PORTABLE CHEST - 1 VIEW  COMPARISON:  02/03/2015  FINDINGS: Chronic cardiomegaly. There is stable upper mediastinal widening. There is new diffuse interstitial opacity, especially prominent at the hila. No effusion or pneumothorax.  IMPRESSION: New pulmonary edema.   Electronically Signed   By: Marnee Spring M.D.   On: 02/05/2015 03:40   Dg Swallowing Func-speech Pathology  02/04/2015    Objective Swallowing Evaluation:    Patient Details  Name: [name] Wise MRN: [id number] Date of Birth: 1951-02-06  Today's Date: 02/04/2015 Time: SLP Start Time (ACUTE ONLY): 1335-SLP Stop Time (ACUTE ONLY): 1354 SLP Time Calculation (min) (ACUTE ONLY): 19 min  Past Medical History:  Past Medical History  Diagnosis Date  . Gout   . Hypertension   . Schizophrenia   . Tobacco abuse    Past Surgical History:  Past Surgical History  Procedure Laterality Date  . None     HPI:  HPI: 64 yo male adm to [facility] with AMS- suspected UTI.  Pt with elevated  troponins, CKD stage IV, anemia.  PMH + for schizophrenia.  CT head  negative, MRI negative, CXR  negative.  Pt failed RNSSS and SLP evaluation  ordered.    No Data Recorded  Assessment / Plan / Recommendation CHL IP CLINICAL IMPRESSIONS 02/04/2015  Dysphagia Diagnosis Moderate oral phase dysphagia;Severe oral phase  dysphagia;Moderate pharyngeal phase dysphagia  Clinical impression Moderately severe oropharyngeal dysphagia with  sensorimotor deficits.  Pt did not aspirate any consistency tested - trace  laryngeal penetration of thin noted that cleared with further swallows.   Severe delay in oral transiting (up to 12 seconds) noted with pt requiring  verbal cues to swallow -  delay was worse with increased viscocity.   Piecemealing noted which was effective.  Pt pharyngeal swallow  characterized by delay and weakness with resultant residuals without pt  sensation.  CUED dry swallows effective to decrease residuals.    Recommend pt consume full liquid diet initially with strict aspiration  precautions.  Using  live video, educated pt to findings, recommendations.     Will follow for readiness for dietary advancement.  SLP set up oral  suction in pt's room to use prn.  Hopeful for pt swallow to improve during  acute stay given his good po tolerance during last admit and negative  MRI/CT.        CHL IP TREATMENT RECOMMENDATION 02/04/2015  Treatment Plan Recommendations Therapy as outlined in treatment plan below      CHL IP DIET RECOMMENDATION 02/04/2015  Diet Recommendations Thin liquid  Liquid Administration via Cup  Medication Administration Crushed with puree  Compensations Slow rate;Small sips/bites  Postural Changes and/or Swallow Maneuvers Seated upright 90  degrees;Upright 30-60 min after meal           CHL IP FREQUENCY AND DURATION 02/04/2015  Speech Therapy Frequency (ACUTE ONLY) min 2x/week  Treatment Duration 2 weeks         CHL IP REASON FOR REFERRAL 02/04/2015  Reason for Referral Objectively evaluate swallowing function     CHL IP ORAL PHASE 02/04/2015                                      Oral - Nectar Teaspoon  Pocketing in anterior sulcus;Holding of  bolus;Delayed oral transit  Oral - Nectar Cup Delayed oral transit;Weak lingual manipulation;Piecemeal  swallowing;Reduced posterior propulsion;Incomplete tongue to palate  contact;Lingual/palatal residue;Holding of bolus           Oral - Thin Teaspoon Delayed oral transit;Weak lingual  manipulation;Piecemeal swallowing;Reduced posterior propulsion;Incomplete  tongue to palate contact;Lingual/palatal residue;Holding of bolus  Oral - Thin Cup Weak lingual manipulation;Delayed oral transit;Piecemeal  swallowing;Reduced posterior propulsion;Incomplete tongue to palate  contact;Lingual/palatal residue;Holding of bolus  Oral - Thin Straw Weak lingual manipulation;Delayed oral transit;Piecemeal  swallowing;Reduced posterior propulsion;Incomplete tongue to palate  contact;Lingual/palatal residue;Holding of bolus  Oral - Thin Syringe (None)  Oral - Puree Weak lingual manipulation;Delayed oral transit;Piecemeal  swallowing;Reduced posterior propulsion;Pocketing in anterior  sulcus;Incomplete tongue to palate contact;Lingual/palatal residue;Holding  of bolus  Oral - Mechanical Soft Weak lingual manipulation;Delayed oral  transit;Piecemeal swallowing;Reduced posterior propulsion;Pocketing in  anterior sulcus;Incomplete tongue to palate contact;Lingual/palatal  residue;Holding of bolus;Impaired mastication        Oral - Pill (None)  Oral Phase - Comment cues to swallow effective albeit pt with continued  delay, pt did not intiate swallow with first bolus of nectar thick barium-  required larger bolus to elicit transit *spillage into pharnx      CHL IP PHARYNGEAL PHASE 02/04/2015  Pharyngeal Phase Impaired                                Pharyngeal - Nectar Teaspoon Delayed swallow initiation;Premature spillage  to pyriform sinuses;Lateral channel residue  Penetration/Aspiration details (nectar teaspoon) (None)  Pharyngeal - Nectar Cup Delayed swallow initiation;Reduced pharyngeal   peristalsis;Reduced tongue base retraction;Premature spillage to  valleculae;Lateral channel residue                       Pharyngeal - Thin Teaspoon Delayed swallow initiation;Reduced tongue base  retraction;Reduced pharyngeal peristalsis;Lateral channel residue  Penetration/Aspiration details (thin teaspoon) (None)  Pharyngeal - Thin Cup Delayed swallow initiation;Reduced pharyngeal  peristalsis;Reduced tongue base retraction;Lateral channel residue  Penetration/Aspiration details (thin cup) (None)  Pharyngeal - Thin Straw Delayed swallow initiation;Reduced tongue base  retraction;Reduced  pharyngeal peristalsis;Lateral channel residue           Pharyngeal - Puree Delayed swallow initiation;Reduced tongue base  retraction;Reduced pharyngeal peristalsis;Pharyngeal residue - valleculae  Penetration/Aspiration details (puree) (None)  Pharyngeal - Mechanical Soft Delayed swallow initiation;Reduced tongue  base retraction;Reduced pharyngeal peristalsis;Pharyngeal residue -  valleculae                       Pharyngeal Comment pt required verbal cues to conduct dry swallows to  faciliate clearance of pharynx - he does not sense residuals which will  increase his aspiration risk     CHL IP CERVICAL ESOPHAGEAL PHASE 02/04/2015  Cervical Esophageal Phase [facility]                                                      Donavan Burnet, MS [facility] SLP [phone]     Scheduled Meds: . sodium chloride   Intravenous Once  . aspirin  325 mg Oral Daily   Or  . aspirin  300 mg Rectal Daily  . enoxaparin (LOVENOX) injection  30 mg Subcutaneous QHS  . ferrous sulfate  325 mg Oral TID WC  . folic acid  1 mg Oral Daily  . furosemide  40 mg Intravenous Once  . insulin aspart  0-9 Units Subcutaneous TID WC  . metoprolol  5 mg Intravenous 4 times per day  . piperacillin-tazobactam (ZOSYN)  IV  2.25 g Intravenous Q8H  . vancomycin  750 mg Intravenous Q48H   Continuous Infusions:    Time spent: > 35 minutes    Penny Pia  [facility] Pager [phone] If 7PM-7AM, please contact night-coverage at [email or web address], password [facility] 02/05/2015, 3:51 PM  LOS: 2 days

## 2015-02-05 NOTE — Progress Notes (Signed)
ANTIBIOTIC CONSULT NOTE - INITIAL  Pharmacy Consult for Vancomycin and Zosyn Indication: SIRS  No Known Allergies  Patient Measurements: Height:  (167.6 cm) Weight: 130 lb 1.1 oz (59 kg) IBW/kg (Calculated) : 63.8   Vital Signs: Temp: 98.1 F (36.7 C) (03/03 1300) Temp Source: Oral (03/03 1300) BP: 134/81 mmHg (03/03 1300) Pulse Rate: 108 (03/03 1300) Intake/Output from previous day: 03/02 0701 - 03/03 0700 In: 2383.8 [P.O.:1160; I.V.:973.8; IV Piggyback:250] Out: 1375 [Urine:1375] Intake/Output from this shift:    Labs:  Recent Labs  02/03/15 1500 02/05/15 0517  WBC 10.9* 11.5*  HGB 7.3* 6.9*  PLT 254 243  CREATININE 4.54* 3.36*   Estimated Creatinine Clearance: 18.8 mL/min (by C-G formula based on Cr of 3.36). No results for input(s): VANCOTROUGH, VANCOPEAK, VANCORANDOM, GENTTROUGH, GENTPEAK, GENTRANDOM, TOBRATROUGH, TOBRAPEAK, TOBRARND, AMIKACINPEAK, AMIKACINTROU, AMIKACIN in the last 72 hours.   Microbiology: Recent Results (from the past 720 hour(s))  Culture, blood (routine x 2)     Status: None   Collection Time: 01/13/15  9:23 AM  Result Value Ref Range Status   Specimen Description BLOOD RIGHT HAND  Final   Special Requests BOTTLES DRAWN AEROBIC AND ANAEROBIC  Final   Culture   Final    NO GROWTH 5 DAYS Performed at [facility]    Report Status 01/19/2015 FINAL  Final  Culture, blood (routine x 2)     Status: None   Collection Time: 01/13/15  9:31 AM  Result Value Ref Range Status   Specimen Description BLOOD RIGHT ANTECUBITAL  Final   Special Requests BOTTLES DRAWN AEROBIC AND ANAEROBIC   Final   Culture   Final    NO GROWTH 5 DAYS Performed at [facility]    Report Status 01/19/2015 FINAL  Final  Urine culture     Status: None   Collection Time: 01/13/15 11:36 AM  Result Value Ref Range Status   Specimen Description URINE, CLEAN CATCH  Final   Special Requests NONE  Final   Colony Count NO GROWTH Performed  at [facility]   Final   Culture NO GROWTH Performed at [facility]   Final   Report Status 01/14/2015 FINAL  Final  Culture, blood (routine x 2)     Status: None   Collection Time: 01/19/15  1:20 AM  Result Value Ref Range Status   Specimen Description BLOOD RIGHT ARM  Final   Special Requests BOTTLES DRAWN AEROBIC AND ANAEROBIC 10CC  Final   Culture   Final    NO GROWTH 5 DAYS Performed at [facility]    Report Status 01/25/2015 FINAL  Final  Culture, blood (routine x 2)     Status: None   Collection Time: 01/19/15  1:24 AM  Result Value Ref Range Status   Specimen Description BLOOD RIGHT HAND  Final   Special Requests BOTTLES DRAWN AEROBIC ONLY 10CC  Final   Culture   Final    NO GROWTH 5 DAYS Performed at [facility]    Report Status 01/25/2015 FINAL  Final  Culture, Urine     Status: None   Collection Time: 02/03/15  9:00 PM  Result Value Ref Range Status   Specimen Description URINE, RANDOM  Final   Special Requests NONE  Final   Colony Count NO GROWTH Performed at [facility]   Final   Culture NO GROWTH Performed at [facility]   Final   Report Status 02/05/2015 FINAL  Final  MRSA PCR Screening     Status: None   Collection Time: 02/04/15 12:34 AM  Result Value Ref Range Status   MRSA by PCR NEGATIVE NEGATIVE Final    Comment:        The GeneXpert MRSA Assay (FDA approved for NASAL specimens only), is one component of a comprehensive MRSA colonization surveillance program. It is not intended to diagnose MRSA infection nor to guide or monitor treatment for MRSA infections.     Medical History: Past Medical History  Diagnosis Date  . Gout   . Hypertension   . Schizophrenia   . Tobacco abuse     Medications:  Scheduled:  . sodium chloride   Intravenous Once  . aspirin  325 mg Oral Daily   Or  . aspirin  300 mg Rectal Daily  . enoxaparin (LOVENOX) injection  30 mg Subcutaneous QHS  .  ferrous sulfate  325 mg Oral TID WC  . folic acid  1 mg Oral Daily  . furosemide  40 mg Intravenous Once  . insulin aspart  0-9 Units Subcutaneous TID WC  . metoprolol  5 mg Intravenous 4 times per day   Infusions:   Assessment: 64 [facility]- suspected UTI. Pt with elevated troponins, CKD stage IV, anemia. PMH + for schizophrenia.  Pharmacy consulted to dose Vancomycin and zosyn for SIRS.  Scr 3.36, CrCl~ 18.468mls/min  Goal of Therapy:  Vancomycin trough level 15-20 mcg/ml  Zosyn and Vanc per renal function  Plan:   Vancomycin 750mg  IV q48h  Zosyn 2.25mg  IV q8h  Follow renal function/ cultures/clinical course  Vanc trough as needed  Arley Phenixllen Adiana Smelcer RPh 02/05/2015, 3:26 PM Pager [phone]

## 2015-02-05 NOTE — Progress Notes (Addendum)
RN paged pt's temp 103.2. ST 120s. Otherwise VSS. Pt is alert. Crackles in lungs.  Check blood cx x 2, BMP, CBC with diff, Lactate, and CXR. Already had UA and urine culture is pending. Tylenol for fever. Bolus 250cc NS.  Further tx plan depends on results of tests. Will follow.  Jimmye NormanKaren Kirby-Graham, NP [facility] Update: Looked for stat labs ordered at 0308. Haven't been drawn yet. Called RN to please call lab and retake his VS and text to me. CXR showed new pulmonary edema. Will await vitals and labs and go from there.  KJKG, NP Update: Lactate normal. CBC with slightly elevated WBCC over yesterday. Creat is down some. bld cx pending. Temp down and BP, RR, O2 sat and HR all stable. Hgb down-has had IVF-may be dilution. Reviewed iron panel and he may need RBC today. Will report to attending at 0700. Holding abx as well since no source of infection and Lactate normal, but it may be the urine, of which, culture is still pending. Craige CottaKirby

## 2015-02-05 NOTE — Progress Notes (Signed)
Speech Language Pathology Treatment: Dysphagia  Patient Details Name: [name] Wise MRN: [id number] DOB: 08/04/1951 Today's Date: 02/05/2015 Time: 1005-1030 SLP Time Calculation (min) (ACUTE ONLY): 25 min  Assessment / Plan / Recommendation Clinical Impression  Note events of last evening with pt having temperature and lung base crackles.  RN reports pt was given medicine with water last evening and aspiration may have occurred.  Reviewed need to give medicine with applesauce - RN reported this was method used this am with good pt tolerance.    SLP assisted pt to consume applesauce, jello and juice - He continues with moderately severe oral dysphagia and no s/s of aspiration.  Pt unable to feed himself and is impulsive, therefore recommend continue with full liquid and strict aspiration precautions/full supervision.   Reviewed with pt clinical reasoning for compensation strategies.    Given pt without neurological change- MRI/CT negative will defer SLE.  Will follow for readiness for dietary advancement.       HPI HPI: 64 [facility]- suspected UTI.  Pt with elevated troponins, CKD stage IV, anemia.  PMH + for schizophrenia.  CT head negative, MRI negative, CXR negative.  Pt failed RNSSS and SLP evaluation ordered.     Pertinent Vitals  febrile, crackles  SLP Plan  Continue with current plan of care    Recommendations Diet recommendations: Thin liquid Liquids provided via: Cup;Straw Medication Administration: Crushed with puree Supervision: Full supervision/cueing for compensatory strategies Compensations: Slow rate;Small sips/bites (intermittent dry swallows) Postural Changes and/or Swallow Maneuvers: Seated upright 90 degrees;Upright 30-60 min after meal              Oral Care Recommendations: Oral care BID Follow up Recommendations: Skilled Nursing facility Plan: Continue with current plan of care    GO     Donavan Burnetamara Joann Kulpa, MS [facility] S[facility] [phone]

## 2015-02-05 NOTE — Progress Notes (Signed)
OT Cancellation Note  Patient Details Name: [name] Wise MRN: [id number] DOB: [birth year]   Cancelled Treatment:    Reason Eval/Treat Not Completed: Other (comment).  Spoke to [employer].  Pt very limited by gout pain.  Will check another day.  [name] Wise 02/05/2015, 11:47 AM  [name] Wise, OTR/L [phone] 02/05/2015

## 2015-02-06 ENCOUNTER — Inpatient Hospital Stay (HOSPITAL_COMMUNITY): Payer: Medicare Other

## 2015-02-06 DIAGNOSIS — N179 Acute kidney failure, unspecified: Secondary | ICD-10-CM

## 2015-02-06 DIAGNOSIS — I5033 Acute on chronic diastolic (congestive) heart failure: Secondary | ICD-10-CM

## 2015-02-06 DIAGNOSIS — R7881 Bacteremia: Secondary | ICD-10-CM

## 2015-02-06 DIAGNOSIS — N189 Chronic kidney disease, unspecified: Secondary | ICD-10-CM

## 2015-02-06 DIAGNOSIS — A499 Bacterial infection, unspecified: Secondary | ICD-10-CM

## 2015-02-06 DIAGNOSIS — G9340 Encephalopathy, unspecified: Secondary | ICD-10-CM

## 2015-02-06 LAB — URINE CULTURE

## 2015-02-06 LAB — COMPREHENSIVE METABOLIC PANEL
ALT: 30 U/L (ref 0–53)
ANION GAP: 10 (ref 5–15)
AST: 46 U/L — ABNORMAL HIGH (ref 0–37)
Albumin: 1.9 g/dL — ABNORMAL LOW (ref 3.5–5.2)
Alkaline Phosphatase: 154 U/L — ABNORMAL HIGH (ref 39–117)
BUN: 50 mg/dL — AB (ref 6–23)
CALCIUM: 8.7 mg/dL (ref 8.4–10.5)
CO2: 18 mmol/L — ABNORMAL LOW (ref 19–32)
CREATININE: 3.11 mg/dL — AB (ref 0.50–1.35)
Chloride: 110 mmol/L (ref 96–112)
GFR, EST AFRICAN AMERICAN: 23 mL/min — AB (ref >90)
GFR, EST NON AFRICAN AMERICAN: 20 mL/min — AB (ref >90)
GLUCOSE: 179 mg/dL — AB (ref 70–99)
Potassium: 4.3 mmol/L (ref 3.5–5.1)
Sodium: 138 mmol/L (ref 135–145)
TOTAL PROTEIN: 6.9 g/dL (ref 6.0–8.3)
Total Bilirubin: 0.9 mg/dL (ref 0.3–1.2)

## 2015-02-06 LAB — GLUCOSE, CAPILLARY
GLUCOSE-CAPILLARY: 255 mg/dL — AB (ref 70–99)
GLUCOSE-CAPILLARY: 130 mg/dL — AB (ref 70–99)
Glucose-Capillary: 145 mg/dL — ABNORMAL HIGH (ref 70–99)
Glucose-Capillary: 139 mg/dL — ABNORMAL HIGH (ref 70–99)
Glucose-Capillary: 188 mg/dL — ABNORMAL HIGH (ref 70–99)

## 2015-02-06 LAB — CBC
HCT: 25.7 % — ABNORMAL LOW (ref 39.0–52.0)
HEMOGLOBIN: 8.3 g/dL — AB (ref 13.0–17.0)
MCH: 30.6 pg (ref 26.0–34.0)
MCHC: 32.3 g/dL (ref 30.0–36.0)
MCV: 94.8 fL (ref 78.0–100.0)
Platelets: 269 10*3/uL (ref 150–400)
RBC: 2.71 MIL/uL — AB (ref 4.22–5.81)
RDW: 16.9 % — ABNORMAL HIGH (ref 11.5–15.5)
WBC: 13 10*3/uL — AB (ref 4.0–10.5)

## 2015-02-06 LAB — PROCALCITONIN: Procalcitonin: 33.4 ng/mL

## 2015-02-06 LAB — TYPE AND SCREEN
ABO/RH(D): O POS
Antibody Screen: NEGATIVE
Unit division: 0

## 2015-02-06 LAB — URIC ACID: Uric Acid, Serum: 11.8 mg/dL — ABNORMAL HIGH (ref 4.0–7.8)

## 2015-02-06 LAB — BRAIN NATRIURETIC PEPTIDE: B Natriuretic Peptide: 1559.6 pg/mL — ABNORMAL HIGH (ref 0.0–100.0)

## 2015-02-06 LAB — SEDIMENTATION RATE: Sed Rate: >140 mm/hr — ABNORMAL HIGH (ref 0–16)

## 2015-02-06 MED ORDER — CYANOCOBALAMIN 250 MCG PO TABS
250.0000 ug | ORAL_TABLET | Freq: Every day | ORAL | Status: DC
  Administered 2015-02-06 – 2015-02-07 (×2): 250 ug via ORAL
  Filled 2015-02-06 (×2): qty 1

## 2015-02-06 MED ORDER — CETYLPYRIDINIUM CHLORIDE 0.05 % MT LIQD
7.0000 mL | Freq: Two times a day (BID) | OROMUCOSAL | Status: DC
  Administered 2015-02-06 – 2015-02-08 (×4): 7 mL via OROMUCOSAL

## 2015-02-06 MED ORDER — FUROSEMIDE 10 MG/ML IJ SOLN
40.0000 mg | Freq: Once | INTRAMUSCULAR | Status: AC
  Administered 2015-02-06: 40 mg via INTRAVENOUS
  Filled 2015-02-06: qty 4

## 2015-02-06 NOTE — Progress Notes (Addendum)
PROGRESS NOTE  [name] Wise WGN:[id number] DOB: [birth year] DOA: 02/03/2015 PCP: Ralene Ok, MD  Brief History 64 y/o male with history of HTN, paranoid schizophrenia, CKD4, gout, anemia, DM2, 02/03/2015 with altered mental status. Apparently the patient was not acting right when he went to [facility]. The patient is a poor historian. Since admission, the patient's hospitalization has become acute by recurrent fever and pulmonary edema. The patient was also found to have acute on chronic renal failure. Assessment/Plan: Acute encephalopathy -Appears to be somewhat better than the day of admission -Multifactorial including anemia, acute on chronic renal failure, possible infectious process, and metabolic derangements -B12, folic acid, TSH, RPR, HIV are negative -Ammonia mildly elevated at 42 Fever -Possible etiologies include gouty arthritis flare, septic arthritis, bacteremia -Continue antibiotics pending culture data  Bacteremia  -02/05/2015 blood cultures--gram-positive cocci in clusters one of 2 sets  -Continue vancomycin pending final susceptibilities  Acute on chronic renal failure (CKD stage IV)  -Renal ultrasound negative for hydronephrosis  -Monitor closely with diuresis  Acute on chronic diastolic CHF  -02/04/2015 echo--EF 55-60%, grade 1 diastolic dysfunction  -Daily weights  -Judicious furosemide  -Check BNP  -No respiratory distress. Presently stable on room air  -pt urinated 2.5L with one dose lasix IV Elevated troponin  -Doubt ACS -Likely demand ischemia in the setting of fever, CKD, and CHF -EKG without concerning ischemic changes -Presently without any chest pain Gouty arthritis -check uric acid -Xray right knee, if significant effusion-->arthrocentesis Dysphagia -Modified barium swallow shows moderate to severe oropharyngeal dysphagia  -Appreciate speech therapy follow-up  -Continue full liquid diet  Macrocytic anemia  -01/15/2015 patient had a low B12    -Will supplement  -Check RBC folate  -Repeat Hemoccult stool  -Patient had negative Hemoccult on 01/16/2015 and 02/03/2015  Hypertension -continue metoprolol -fair control  Family Communication:   Pt at beside Disposition Plan:   SNF when medically stable       Procedures/Studies: Ct Abdomen Wo Contrast  2015/02/07   CLINICAL DATA:  Stage 4 chronic kidney disease.  EXAM: CT ABDOMEN WITHOUT CONTRAST  TECHNIQUE: Multidetector CT imaging of the abdomen was performed following the standard protocol without IV contrast.  COMPARISON:  None.  FINDINGS: Lower chest: Mild cardiomegaly noted. Tiny bilateral pleural effusions versus pleural thickening.  Hepatobiliary: No mass visualized on this non-contrast exam. Gallbladder is unremarkable.  Pancreas: No mass or inflammatory process visualized on this non-contrast exam.  Spleen:  Within normal limits in size.  Adrenal Glands/Kidneys: No adrenal mass identified. Mild bilateral renal parenchymal scarring and atrophy noted. Tiny punctate calcifications could be due to nonobstructing bilateral renal calculi versus vascular calcification. No evidence of hydronephrosis.  Stomach/Bowel/Peritoneum:  Unremarkable.  Vascular/Lymphatic: No pathologically enlarged lymph nodes identified. 3.1 cm infrarenal abdominal aortic aneurysm noted. No evidence of retroperitoneal hemorrhage.  Other:  None.  Musculoskeletal:  No suspicious bone lesions identified.  IMPRESSION: Mild bilateral renal parenchymal scarring and atrophy. No evidence of hydronephrosis.  Tiny nonobstructing bilateral renal calculi versus vascular calcifications.  3.1 cm infrarenal abdominal aortic aneurysm. No evidence of aneurysm leak or rupture. Recommend followup by ultrasound in 3 years. This recommendation follows ACR consensus guidelines: White Paper of the ACR Incidental Findings Committee II on Vascular Findings. [place] Coll Radiol 2013; 10:789-794   Electronically Signed   By: Myles Rosenthal M.D.   On:  02/07/2015 16:23   Dg Chest 2 View  02/03/2015   CLINICAL DATA:  Weakness.  Altered mental status.  EXAM: CHEST  2 VIEW  COMPARISON:  Single view of the chest 01/19/2015.  FINDINGS: Lung volumes are low with mild basilar atelectasis. There is cardiomegaly without edema. No pneumothorax or pleural effusion.  IMPRESSION: No acute finding in a low volume chest.  Cardiomegaly.   Electronically Signed   By: Drusilla Kanner M.D.   On: 02/03/2015 16:37   Dg Chest 2 View  01/13/2015   CLINICAL DATA:  Bilateral lower extremity pain  EXAM: CHEST  2 VIEW  COMPARISON:  None.  FINDINGS: Lungs are clear. Heart is upper normal in size with pulmonary vascularity within normal limits. No adenopathy. No bone lesions.  IMPRESSION: No edema or consolidation.   Electronically Signed   By: Bretta Bang III M.D.   On: 01/13/2015 09:57   Dg Knee 1-2 Views Right  01/17/2015   CLINICAL DATA:  Right knee pain and swelling for 2-3 days. No known injury. Gout.  EXAM: RIGHT KNEE - 1-2 VIEW  COMPARISON:  None.  FINDINGS: No evidence of acute fracture or dislocation. Diffuse soft tissue swelling is seen most severe in the suprasellar region. Small knee joint effusion cannot be excluded. No evidence joint space narrowing, osteophytosis, or periarticular erosions. Peripheral vascular calcification noted.  IMPRESSION: Diffuse soft tissue swelling. Small knee joint effusion cannot be excluded. No osseous abnormality identified.   Electronically Signed   By: Myles Rosenthal M.D.   On: 01/17/2015 14:03   Ct Head Wo Contrast  02/03/2015   CLINICAL DATA:  Mental status changes.  Schizophrenia.  EXAM: CT HEAD WITHOUT CONTRAST  TECHNIQUE: Contiguous axial images were obtained from the base of the skull through the vertex without intravenous contrast.  COMPARISON:  None.  FINDINGS: Sinuses/Soft tissues: Hypoplastic frontal sinuses. Other paranasal sinuses and mastoid air cells are clear.  Intracranial: Moderate low density in the periventricular  white matter likely related to small vessel disease. Mild cerebral atrophy for age. Ventriculomegaly is mild and felt to be secondary to cerebral atrophy. Dense atherosclerosis in the bilateral vertebral and carotid arteries. No mass lesion, hemorrhage, acute infarct, intra-axial, or extra-axial fluid collection.  IMPRESSION: 1.  No acute intracranial abnormality. 2.  Cerebral atrophy and small vessel ischemic change. 3. Atherosclerosis.   Electronically Signed   By: Jeronimo Greaves M.D.   On: 02/03/2015 16:44   Mr Brain Wo Contrast (neuro Protocol)  02/03/2015   CLINICAL DATA:  Initial evaluation for acute onset confusion, altered mental status, history of schizophrenia.  EXAM: MRI HEAD WITHOUT CONTRAST  TECHNIQUE: Multiplanar, multiecho pulse sequences of the brain and surrounding structures were obtained without intravenous contrast.  COMPARISON:  Prior CT from earlier the same day.  FINDINGS: Study is somewhat degraded by motion artifact.  Diffuse prominence of the CSF containing spaces is compatible with generalized cerebral atrophy. Patchy and confluent T2/FLAIR hyperintensity within the periventricular and deep white matter both cerebral hemispheres noted, nonspecific, but most likely related to chronic small vessel ischemic changes.  No abnormal foci of restricted diffusion to suggest acute intracranial infarct. Tiny mildly intense focus of signal intensity seen at the midline at the anterior genu of corpus callosum favored to be artifactual in nature (series 4, image 33). Gray-white matter differentiation maintained. Normal intravascular flow voids are present. Small remote lacunar infarct present within the right basal ganglia.  No acute or chronic intracranial hemorrhage identified.  No mass lesion or midline shift. Mild ventricular prominence related global parenchymal volume loss present without hydrocephalus. No extra-axial fluid collection.  Craniocervical junction within  normal limits. Pituitary gland  is normal. No acute abnormality seen about the orbits.  Paranasal sinuses are grossly clear. There is mild scattered fluid density within the right mastoid air cells.  Bone marrow signal intensity within normal limits. Scalp soft tissues unremarkable.  IMPRESSION: 1. Motion degraded study. No acute intracranial infarct or other process identified. 2. Atrophy with moderate chronic microvascular ischemic disease.   Electronically Signed   By: Rise MuBenjamin  McClintock M.D.   On: 02/03/2015 21:33   Mr Maxine GlennMra Abdomen Wo Contrast  01/19/2015   CLINICAL DATA:  Unexplained renal failure. Evaluate for renal artery stenosis. Stage 4 kidney disease with cortical thinning.  EXAM: MRA ABDOMEN WITHOUT CONTRAST  TECHNIQUE: Angiographic images of the chest were obtained using MRA technique without intravenous contrast.  CONTRAST:  None  COMPARISON:  CT abdomen pelvis - 01/17/2015 ; renal ultrasound - 01/15/2015  FINDINGS: The examination is degraded secondary to lack of intravenous contrast due to patient's renal insufficiency. Examination is further degraded secondary to patient's overlying left upper extremity  Vascular Findings:  Abdominal aorta: The abdominal aorta is suboptimally evaluated on this noncontrast examination. The external caliber of the abdominal aorta is unchanged, measuring approximately 3.0 cm in maximal oblique axial dimension (image 31, series 3).  Celiac artery: There is a minimal amount of eccentric atherosclerotic plaque involving the origin of the celiac artery, not definitely resulting in a hemodynamically significant stenosis. Conventional branching pattern.  SMA: There is a minimal amount of eccentric atherosclerotic plaque involving the origin of the SMA, not definitely resulting in a hemodynamically significant stenosis. The mid and distal aspects of the main trunk of the SMA are suboptimally evaluated.  Right Renal artery: Suboptimally evaluated. There is a apparent minimal amount of focal  atherosclerotic plaque involving the origin of the right renal artery though evaluation for hemodynamic significance is degraded secondary to suboptimal vessel enhancement.  Left Renal artery: There is nondiagnostic evaluation of the left renal artery secondary to suboptimal vessel opacification.  IMA: Not evaluated  Review of the MIP images confirms the above findings.   --------------------------------------------------------------------------------  Nonvascular Findings:  Bilateral renal cortical atrophy, left greater than right, appears grossly unchanged. No urinary obstruction or perinephric stranding.  Normal hepatic contour. Normal noncontrast appearance of the gallbladder. Normal noncontrast appearance of the bilateral adrenal glands, pancreas and spleen. Visualized loops of bowel appear normal. No retroperitoneal or mesenteric adenopathy within the imaged upper abdomen.  IMPRESSION: 1. Markedly degraded examination with nondiagnostic evaluation of the left renal artery. There is eccentric atherosclerotic plaque involving the origin of the right renal artery though evaluation for hemodynamic significance is degraded secondary to suboptimal opacification of the downstream right renal artery. If renal artery stenosis remains of clinical concern, further evaluation could be performed with Renal artery duplex ultrasound which if abnormal could be confirmed with a CO2 angiogram. 2. Grossly unchanged approximately 3.0 cm infrarenal abdominal aortic aneurysm.   Electronically Signed   By: Simonne ComeJohn  Watts M.D.   On: 01/19/2015 08:09   [place] Renal  02/04/2015   CLINICAL DATA:  Acute on chronic renal failure, history smoking, hypertension, altered mental status  EXAM: RENAL/URINARY TRACT ULTRASOUND COMPLETE  COMPARISON:  CT abdomen and pelvis 01/17/2015, renal ultrasound 01/15/2015  FINDINGS: Right Kidney:  Length: 10.0 cm. Cortical thinning. Increased cortical echogenicity. No mass, hydronephrosis or shadowing  calcification.  Left Kidney:  Length: 10.1 cm. Cortical thinning. Increased cortical echogenicity. No mass, hydronephrosis or shadowing calcification.  Bladder:  Slightly irregular posterior bladder wall favor trabeculation.  Bladder normally distended without definite mass. Prostate gland not visualized.  IMPRESSION: Medical renal disease changes and cortical atrophy of both kidneys.  No gross evidence of renal mass or hydronephrosis.  Question trabeculation of bladder wall, which may be seen with chronic outlet obstruction.   Electronically Signed   By: Ulyses Southward M.D.   On: 02/04/2015 20:15   US Renal  01/15/2015   CLINICAL DATA:  Initial evaluation for renal failure  EXAM: RENAL/URINARY TRACT ULTRASOUND COMPLETE  COMPARISON:  Renal ultrasound 10/03/2003  FINDINGS: Right Kidney:  Length: 10.0 cm. Significant cortical thinning. Increased echogenicity. No hydronephrosis.  Left Kidney:  Length: 9.5 cm. Significant cortical thinning. Increased echogenicity. No hydronephrosis.  Bladder:  Bladder is decompressed by Foley catheter.  IMPRESSION: Similar to prior study there is significant cortical atrophy with evidence of medical renal disease but there are no acute findings.   Electronically Signed   By: Esperanza Heir M.D.   On: 01/15/2015 15:56   Nm Myocar Multi W/spect W/wall Motion / Ef  01/18/2015   CLINICAL DATA:  elevated troponins, tobacco abuse, HTN, GOUT, schizophrenia, bilateral leg pain.  EXAM: MYOCARDIAL IMAGING WITH SPECT (REST AND PHARMACOLOGIC-STRESS)  GATED LEFT VENTRICULAR WALL MOTION STUDY  LEFT VENTRICULAR EJECTION FRACTION  TECHNIQUE: Standard myocardial SPECT imaging was performed after resting intravenous injection of 10 mCi Tc-68m sestamibi. Subsequently, intravenous infusion of Lexiscan was performed under the supervision of the Cardiology staff. At peak effect of the drug, 30 mCi Tc-4m sestamibi was injected intravenously and standard myocardial SPECT imaging was performed. Quantitative  gated imaging was also performed to evaluate left ventricular wall motion, and estimate left ventricular ejection fraction.  COMPARISON:  None.  FINDINGS: Perfusion: Mildly decreased activity in the anteroseptal region of the left ventricle and lateral wall, on both rest and stress sequences. No decreased activity in the left ventricle on stress imaging to suggest reversible ischemia or infarction. There is a moderate amount of subdiaphragmatic activity noted on both sequences.  Wall Motion: Normal left ventricular wall motion. No left ventricular dilation.  Left Ventricular Ejection Fraction: 60 %  End diastolic volume 18 ml  End systolic volume 45 ml  IMPRESSION: 1. No reversible ischemia. Lateral and anteroseptal attenuation versus scar.  2. Normal left ventricular wall motion.  3. Left ventricular ejection fraction 60%  4. Low-risk stress test findings*.  *2012 Appropriate Use Criteria for Coronary Revascularization Focused Update: J Am Coll Cardiol. 2012;59(9):857-881. http://content.dementiazones.com.aspx?articleid=1201161   Electronically Signed   By: Corlis Leak M.D.   On: 01/18/2015 14:26   Dg Chest Port 1 View  02/05/2015   CLINICAL DATA:  Fever and chest crackles  EXAM: PORTABLE CHEST - 1 VIEW  COMPARISON:  02/03/2015  FINDINGS: Chronic cardiomegaly. There is stable upper mediastinal widening. There is new diffuse interstitial opacity, especially prominent at the hila. No effusion or pneumothorax.  IMPRESSION: New pulmonary edema.   Electronically Signed   By: Marnee Spring M.D.   On: 02/05/2015 03:40   Dg Chest Port 1 View  01/19/2015   CLINICAL DATA:  Sudden onset of fever.  EXAM: PORTABLE CHEST - 1 VIEW  COMPARISON:  01/13/2015  FINDINGS: A single AP portable view of the chest demonstrates no focal airspace consolidation or alveolar edema. The lungs are grossly clear. There is no large effusion or pneumothorax. Cardiac and mediastinal contours appear unremarkable.  There is no significant  interval change.  IMPRESSION: No acute finding   Electronically Signed   By: Rosey Bath.D.  On: 01/19/2015 01:37   Dg Foot 2 Views Left  01/18/2015   CLINICAL DATA:  Diffuse pain. History of gout. No history of trauma  EXAM: LEFT FOOT - 2 VIEW  COMPARISON:  None.  FINDINGS: Frontal and lateral views were obtained. There is no demonstrable fracture or dislocation. There is mild erosive change in the first MTP joint. There is no other erosive change. There is soft tissue swelling dorsally. Other joint spaces appear intact. There is a minimal inferior calcaneal spur.  IMPRESSION: Erosive change first MTP joint. Suspect gout. No fracture or dislocation. Soft tissue swelling dorsally.   Electronically Signed   By: Bretta Bang III M.D.   On: 01/18/2015 17:15   Dg Swallowing Func-speech Pathology  02/04/2015    Objective Swallowing Evaluation:    Patient Details  Name: Bryton Romagnoli MRN: [id number] Date of Birth: [birth year]  Today's Date: 02/04/2015 Time: SLP Start Time (ACUTE ONLY): 1335-SLP Stop Time (ACUTE ONLY): 1354 SLP Time Calculation (min) (ACUTE ONLY): 19 min  Past Medical History:  Past Medical History  Diagnosis Date  . Gout   . Hypertension   . Schizophrenia   . Tobacco abuse    Past Surgical History:  Past Surgical History  Procedure Laterality Date  . None     HPI:  HPI: 64 yo male adm to [facility] with AMS- suspected UTI.  Pt with elevated  troponins, CKD stage IV, anemia.  PMH + for schizophrenia.  CT head  negative, MRI negative, CXR negative.  Pt failed RNSSS and SLP evaluation  ordered.    No Data Recorded  Assessment / Plan / Recommendation CHL IP CLINICAL IMPRESSIONS 02/04/2015  Dysphagia Diagnosis Moderate oral phase dysphagia;Severe oral phase  dysphagia;Moderate pharyngeal phase dysphagia  Clinical impression Moderately severe oropharyngeal dysphagia with  sensorimotor deficits.  Pt did not aspirate any consistency tested - trace  laryngeal penetration of thin noted that cleared with further  swallows.   Severe delay in oral transiting (up to 12 seconds) noted with pt requiring  verbal cues to swallow -  delay was worse with increased viscocity.   Piecemealing noted which was effective.  Pt pharyngeal swallow  characterized by delay and weakness with resultant residuals without pt  sensation.  CUED dry swallows effective to decrease residuals.    Recommend pt consume full liquid diet initially with strict aspiration  precautions.  Using live video, educated pt to findings, recommendations.     Will follow for readiness for dietary advancement.  SLP set up oral  suction in pt's room to use prn.  Hopeful for pt swallow to improve during  acute stay given his good po tolerance during last admit and negative  MRI/CT.        CHL IP TREATMENT RECOMMENDATION 02/04/2015  Treatment Plan Recommendations Therapy as outlined in treatment plan below      CHL IP DIET RECOMMENDATION 02/04/2015  Diet Recommendations Thin liquid  Liquid Administration via Cup  Medication Administration Crushed with puree  Compensations Slow rate;Small sips/bites  Postural Changes and/or Swallow Maneuvers Seated upright 90  degrees;Upright 30-60 min after meal           CHL IP FREQUENCY AND DURATION 02/04/2015  Speech Therapy Frequency (ACUTE ONLY) min 2x/week  Treatment Duration 2 weeks         CHL IP REASON FOR REFERRAL 02/04/2015  Reason for Referral Objectively evaluate swallowing function     CHL IP ORAL PHASE 02/04/2015  Oral - Nectar Teaspoon Pocketing in anterior sulcus;Holding of  bolus;Delayed oral transit  Oral - Nectar Cup Delayed oral transit;Weak lingual manipulation;Piecemeal  swallowing;Reduced posterior propulsion;Incomplete tongue to palate  contact;Lingual/palatal residue;Holding of bolus           Oral - Thin Teaspoon Delayed oral transit;Weak lingual  manipulation;Piecemeal swallowing;Reduced posterior propulsion;Incomplete  tongue to palate contact;Lingual/palatal residue;Holding of bolus   Oral - Thin Cup Weak lingual manipulation;Delayed oral transit;Piecemeal  swallowing;Reduced posterior propulsion;Incomplete tongue to palate  contact;Lingual/palatal residue;Holding of bolus  Oral - Thin Straw Weak lingual manipulation;Delayed oral transit;Piecemeal  swallowing;Reduced posterior propulsion;Incomplete tongue to palate  contact;Lingual/palatal residue;Holding of bolus  Oral - Thin Syringe (None)  Oral - Puree Weak lingual manipulation;Delayed oral transit;Piecemeal  swallowing;Reduced posterior propulsion;Pocketing in anterior  sulcus;Incomplete tongue to palate contact;Lingual/palatal residue;Holding  of bolus  Oral - Mechanical Soft Weak lingual manipulation;Delayed oral  transit;Piecemeal swallowing;Reduced posterior propulsion;Pocketing in  anterior sulcus;Incomplete tongue to palate contact;Lingual/palatal  residue;Holding of bolus;Impaired mastication        Oral - Pill (None)  Oral Phase - Comment cues to swallow effective albeit pt with continued  delay, pt did not intiate swallow with first bolus of nectar thick barium-  required larger bolus to elicit transit *spillage into pharnx      CHL IP PHARYNGEAL PHASE 02/04/2015  Pharyngeal Phase Impaired                                Pharyngeal - Nectar Teaspoon Delayed swallow initiation;Premature spillage  to pyriform sinuses;Lateral channel residue  Penetration/Aspiration details (nectar teaspoon) (None)  Pharyngeal - Nectar Cup Delayed swallow initiation;Reduced pharyngeal  peristalsis;Reduced tongue base retraction;Premature spillage to  valleculae;Lateral channel residue                       Pharyngeal - Thin Teaspoon Delayed swallow initiation;Reduced tongue base  retraction;Reduced pharyngeal peristalsis;Lateral channel residue  Penetration/Aspiration details (thin teaspoon) (None)  Pharyngeal - Thin Cup Delayed swallow initiation;Reduced pharyngeal  peristalsis;Reduced tongue base retraction;Lateral channel residue  Penetration/Aspiration  details (thin cup) (None)  Pharyngeal - Thin Straw Delayed swallow initiation;Reduced tongue base  retraction;Reduced pharyngeal peristalsis;Lateral channel residue           Pharyngeal - Puree Delayed swallow initiation;Reduced tongue base  retraction;Reduced pharyngeal peristalsis;Pharyngeal residue - valleculae  Penetration/Aspiration details (puree) (None)  Pharyngeal - Mechanical Soft Delayed swallow initiation;Reduced tongue  base retraction;Reduced pharyngeal peristalsis;Pharyngeal residue -  valleculae                       Pharyngeal Comment pt required verbal cues to conduct dry swallows to  faciliate clearance of pharynx - he does not sense residuals which will  increase his aspiration risk     CHL IP CERVICAL ESOPHAGEAL PHASE 02/04/2015  Cervical Esophageal Phase [facility]                                                      Donavan Burnet, MS [facility] SLP [phone]         Subjective:  patient complains of knee pain. He also has arm pain. Denies any chest pain, shortness breath, coughing, vomiting, abdominal pain, headache.  Objective: Filed Vitals:   02/05/15 1900 02/05/15 2011 02/05/15 2316  02/06/15 0354  BP: 145/87 132/91 143/87 139/80  Pulse: 125 126 124 117  Temp: 100.7 F (38.2 C) 98.8 F (37.1 C) 98.4 F (36.9 C) 98.5 F (36.9 C)  TempSrc: Oral Oral Oral Oral  Resp: 16 16 16 16   Height:      Weight:      SpO2: 100% 100% 100% 98%    Intake/Output Summary (Last 24 hours) at 02/06/15 0805 Last data filed at 02/06/15 0354  Gross per 24 hour  Intake   1370 ml  Output   2500 ml  Net  -1130 ml   Weight change:  Exam:   General:  Pt is alert, follows commands appropriately, not in acute distress  HEENT: No icterus, No thrush,  [place]/AT  Cardiovascular: RRR, S1/S2, no rubs, no gallops  Respiratory: bibasilar crackles. No wheeze. Good air movement.   Abdomen: Soft/+BS, non tender, non distended, no guarding  Extremities: No LE edema, No lymphangitis, No petechiae, No rashes,  no synovitis; bilateral knee pain without significant effusion. No crepitance.   Data Reviewed: Basic Metabolic Panel:  Recent Labs Lab 02/03/15 1500 02/05/15 0517  NA 138 137  K 5.2* 4.4  CL 102 110  CO2 21 17*  GLUCOSE 150* 213*  BUN 64* 52*  CREATININE 4.54* 3.36*  CALCIUM 8.6 8.3*   Liver Function Tests:  Recent Labs Lab 02/03/15 1500  AST 24  ALT 20  ALKPHOS 130*  BILITOT 0.3  PROT 6.8  ALBUMIN 2.1*   No results for input(s): LIPASE, AMYLASE in the last 168 hours.  Recent Labs Lab 02/03/15 1614  AMMONIA 42*   CBC:  Recent Labs Lab 02/03/15 1500 02/05/15 0517  WBC 10.9* 11.5*  NEUTROABS 9.0* 10.2*  HGB 7.3* 6.9*  HCT 23.8* 22.4*  MCV 99.2 98.7  PLT 254 243   Cardiac Enzymes:  Recent Labs Lab 02/03/15 1642  TROPONINI 0.96*   BNP: Invalid input(s): POCBNP CBG:  Recent Labs Lab 02/05/15 1612 02/05/15 2007 02/05/15 2310 02/06/15 0350 02/06/15 0739  GLUCAP 126* 132* 130* 145* 139*    Recent Results (from the past 240 hour(s))  Culture, Urine     Status: None   Collection Time: 02/03/15  9:00 PM  Result Value Ref Range Status   Specimen Description URINE, RANDOM  Final   Special Requests NONE  Final   Colony Count NO GROWTH Performed at [facility]   Final   Culture NO GROWTH Performed at [facility]   Final   Report Status 02/05/2015 FINAL  Final  MRSA PCR Screening     Status: None   Collection Time: 02/04/15 12:34 AM  Result Value Ref Range Status   MRSA by PCR NEGATIVE NEGATIVE Final    Comment:        The GeneXpert MRSA Assay (FDA approved for NASAL specimens only), is one component of a comprehensive MRSA colonization surveillance program. It is not intended to diagnose MRSA infection nor to guide or monitor treatment for MRSA infections.   Culture, blood (routine x 2)     Status: None (Preliminary result)   Collection Time: 02/05/15  5:15 AM  Result Value Ref Range Status   Specimen  Description BLOOD RIGHT FOREARM  Final   Special Requests BOTTLES DRAWN AEROBIC ONLY  Final   Culture   Final    GRAM POSITIVE COCCI IN CLUSTERS Note: Gram Stain Report Called to,Read Back By and Verified With: LIRA VERDELDIDIOS AT 4:32 A.M. ON 02/06/2015 WARRB Performed at [facility]    Report Status PENDING  Incomplete     Scheduled Meds: . aspirin  325 mg Oral Daily   Or  . aspirin  300 mg Rectal Daily  . enoxaparin (LOVENOX) injection  30 mg Subcutaneous QHS  . ferrous sulfate  325 mg Oral TID WC  . folic acid  1 mg Oral Daily  . insulin aspart  0-9 Units Subcutaneous TID WC  . metoprolol  5 mg Intravenous 4 times per day  . piperacillin-tazobactam (ZOSYN)  IV  2.25 g Intravenous Q8H  . vancomycin  750 mg Intravenous Q48H  . vitamin B-12  250 mcg Oral Daily   Continuous Infusions:    Astryd Pearcy, DO  [facility] Pager [phone]  If 7PM-7AM, please contact night-coverage [email or web address] Password [email or web address] 02/06/2015, 8:05 AM   LOS: 3 days

## 2015-02-06 NOTE — Progress Notes (Addendum)
Speech Language Pathology Treatment: Dysphagia  Patient Details Name: [name] Wise MRN: [id number] DOB: 08-11-1951 Today's Date: 02/06/2015 Time: [id number] SLP Time Calculation (min) (ACUTE ONLY): 28 min  Assessment / Plan / Recommendation Clinical Impression  Pt seen to assess tolerance of po diet and readiness for advancement.  RN reports good tolerance of liquids today.  SLP located pt's dentures in his belongings bag and placed in mouth with adequate fitting.  Pt provided with graham cracker, applesauce and thin water.  Delayed oral manipulation/oral transiting/mastication ongoing.  Pt would close his eyes with mouth full of cracker and cease mastication which increases his aspiration risk.   He required max cues to finish mastication and swallow.  Multiple swallows continue across consistencies but no s/s of aspiration and pt became mildly dyspenic.    At this time, recommend to advance to puree/thin with strict precautions.  SLP to follow up next week for readiness for dietary advancement.  Full supervision indicated.  Note CXR showed improving CHF.      HPI HPI: 64 [facility]- suspected UTI.  Pt with elevated troponins, CKD stage IV, anemia.  PMH + for schizophrenia.  CT head negative, MRI negative, CXR negative.  Pt failed RNSSS and SLP evaluation ordered.     Pertinent Vitals Pain Assessment: Faces Faces Pain Scale: No hurt  SLP Plan  Continue with current plan of care    Recommendations Diet recommendations: Dysphagia 1 (puree);Thin liquid Liquids provided via: Cup;Straw Medication Administration: Crushed with puree Supervision: Full supervision/cueing for compensatory strategies Compensations: Slow rate;Small sips/bites (allow pt time to do dry swallows) Postural Changes and/or Swallow Maneuvers: Seated upright 90 degrees;Upright 30-60 min after meal              Oral Care Recommendations: Oral care BID Follow up Recommendations: Skilled Nursing facility Plan:  Continue with current plan of care    GO     Donavan Burnetamara Patrena Santalucia, MS [facility] SLP [phone]

## 2015-02-06 NOTE — Progress Notes (Signed)
OT Cancellation Note  Patient Details Name: [name] Wise MRN: [id number] DOB: 08-30-1951   Cancelled Treatment:    Reason Eval/Treat Not Completed: Other (comment).  Spoke to [employer].  Pt still having a lot of pain with gout.  Plan is for him to return to SNF. Will defer OT to that venue.  [name] Wise.  [name] Wise 02/06/2015, 11:48 AM  [name] Wise, OTR/L [phone] 02/06/2015

## 2015-02-06 NOTE — Progress Notes (Signed)
CRITICAL VALUE ALERT  Critical value received:  Blood cultures show gram + cocci in clusters  Date of notification:  02/06/2015  Time of notification:  0434  Critical value read back:Yes.    Nurse who received alert:  Ophelia CharterLira, Vergel De Dios, RN  MD notified (1st page):  Kirtland BouchardK. Schoor  Time of first page:  [phone]  Responding MD:  n/a  Time MD responded:  N/a  Pt is on vanco and zosyn currently. Will continue to monitor pt closely. Mardene CelesteAsaro, Andrina Locken I

## 2015-02-06 NOTE — Progress Notes (Signed)
PT Cancellation Note  Patient Details Name: [name] Wise MRN: [id number] DOB: [birth year]   Cancelled Treatment:     PT session cancelled due to pain level and inability to tolerate.  Will check back another day as schedule permits.   Armando ReichertKropski, Zakeya Junker Ann 02/06/2015, 1:31 PM

## 2015-02-07 DIAGNOSIS — R4701 Aphasia: Secondary | ICD-10-CM | POA: Insufficient documentation

## 2015-02-07 LAB — BASIC METABOLIC PANEL
ANION GAP: 15 (ref 5–15)
BUN: 55 mg/dL — ABNORMAL HIGH (ref 6–23)
CO2: 20 mmol/L (ref 19–32)
Calcium: 8.5 mg/dL (ref 8.4–10.5)
Chloride: 102 mmol/L (ref 96–112)
Creatinine, Ser: 3.37 mg/dL — ABNORMAL HIGH (ref 0.50–1.35)
GFR calc Af Amer: 21 mL/min — ABNORMAL LOW (ref >90)
GFR calc non Af Amer: 18 mL/min — ABNORMAL LOW (ref >90)
GLUCOSE: 156 mg/dL — AB (ref 70–99)
Potassium: 3.9 mmol/L (ref 3.5–5.1)
SODIUM: 137 mmol/L (ref 135–145)

## 2015-02-07 LAB — GLUCOSE, CAPILLARY
GLUCOSE-CAPILLARY: 140 mg/dL — AB (ref 70–99)
GLUCOSE-CAPILLARY: 166 mg/dL — AB (ref 70–99)
Glucose-Capillary: 148 mg/dL — ABNORMAL HIGH (ref 70–99)
Glucose-Capillary: 165 mg/dL — ABNORMAL HIGH (ref 70–99)
Glucose-Capillary: 168 mg/dL — ABNORMAL HIGH (ref 70–99)
Glucose-Capillary: 192 mg/dL — ABNORMAL HIGH (ref 70–99)

## 2015-02-07 LAB — CBC
HCT: 24.5 % — ABNORMAL LOW (ref 39.0–52.0)
Hemoglobin: 7.9 g/dL — ABNORMAL LOW (ref 13.0–17.0)
MCH: 30.3 pg (ref 26.0–34.0)
MCHC: 32.2 g/dL (ref 30.0–36.0)
MCV: 93.9 fL (ref 78.0–100.0)
PLATELETS: 257 10*3/uL (ref 150–400)
RBC: 2.61 MIL/uL — AB (ref 4.22–5.81)
RDW: 16.3 % — ABNORMAL HIGH (ref 11.5–15.5)
WBC: 16.6 10*3/uL — AB (ref 4.0–10.5)

## 2015-02-07 LAB — CULTURE, BLOOD (ROUTINE X 2)

## 2015-02-07 LAB — OCCULT BLOOD X 1 CARD TO LAB, STOOL: FECAL OCCULT BLD: NEGATIVE

## 2015-02-07 MED ORDER — METHYLPREDNISOLONE SODIUM SUCC 125 MG IJ SOLR
60.0000 mg | Freq: Every day | INTRAMUSCULAR | Status: AC
  Administered 2015-02-07 – 2015-02-09 (×3): 60 mg via INTRAVENOUS
  Filled 2015-02-07 (×3): qty 2

## 2015-02-07 MED ORDER — METOPROLOL TARTRATE 25 MG PO TABS
25.0000 mg | ORAL_TABLET | Freq: Two times a day (BID) | ORAL | Status: DC
  Administered 2015-02-07 – 2015-02-09 (×4): 25 mg via ORAL
  Filled 2015-02-07 (×4): qty 1

## 2015-02-07 MED ORDER — VITAMIN B-12 1000 MCG PO TABS
500.0000 ug | ORAL_TABLET | Freq: Every day | ORAL | Status: DC
  Administered 2015-02-08 – 2015-02-09 (×2): 500 ug via ORAL
  Filled 2015-02-07 (×2): qty 1

## 2015-02-07 NOTE — Progress Notes (Addendum)
PROGRESS NOTE  [name] Wise ZOX:[id number] DOB: November 23, 1951 DOA: 02/03/2015 PCP: Ralene Ok, MD  Brief History 64 y/o male with history of HTN, paranoid schizophrenia, CKD4, gout, anemia, DM2, presented 02/03/2015 from [facility] with altered mental status. Apparently the patient was not acting right when he went to [place]. The patient is a poor historian. Since admission, the patient's hospitalization has been complicated by recurrent fever and pulmonary edema. The patient was also found to have acute on chronic renal failure. Assessment/Plan: Acute encephalopathy -Appears to be somewhat better than the day of admission -Multifactorial including anemia, acute on chronic renal failure, pulmonary edema, possible infectious process, and metabolic derangements -B12, folic acid, TSH, RPR, HIV are negative -Ammonia mildly elevated at 42 Fever -Suspect this is likely due to gouty arthritis flare -cultures unrevealing -Blood culture likely contaminant with CoNS -Urinalysis without significant pyuria -Chest x-ray without any infiltrates -Discontinue antibiotics and observe  Bacteremia  - likely contaminant with CoNS in 1 of 2 sets Discontinue vancomycin al ultrasound negative for hydronephrosis  -Monitor closely with diuresis  Acute on chronic diastolic CHF  -02/04/2015 echo--EF 55-60%, grade 1 diastolic dysfunction  -Daily weights --neg 7 pounds -Check BNP--1559  -No respiratory distress. Presently stable on room air  -pt urinated 4.5L with 2 doses lasix IV Elevated troponin  -Doubt ACS -Likely demand ischemia in the setting of fever, CKD, and CHF -EKG without concerning ischemic changes -Presently without any chest pain Gouty arthritis/hyperuricemia -check uric acid--11.8 -Xray right knee--no bony abnormality, small effusion  -start solumedrol 60mg  IV daily Diabetes Mellitus -continue novolog sliding scale -pt was not on any meds according to [facility] from SNF -A1C  6.9 Dysphagia -Modified barium swallow shows moderate to severe oropharyngeal dysphagia  -Appreciate speech therapy follow-up  -Started on dysphagia 1 diet  Macrocytic anemia  -01/15/2015 patient had a low B12  -Will supplement  -Check RBC folate  -Repeat Hemoccult stool  -Patient had negative Hemoccult on 01/16/2015 and 02/03/2015  Hypertension -continue metoprolol-->change to po -fair control  Family Communication: Pt at beside Disposition Plan: SNF when medically stable    Procedures/Studies: Ct Abdomen Wo Contrast  2015/01/30   CLINICAL DATA:  Stage 4 chronic kidney disease.  EXAM: CT ABDOMEN WITHOUT CONTRAST  TECHNIQUE: Multidetector CT imaging of the abdomen was performed following the standard protocol without IV contrast.  COMPARISON:  None.  FINDINGS: Lower chest: Mild cardiomegaly noted. Tiny bilateral pleural effusions versus pleural thickening.  Hepatobiliary: No mass visualized on this non-contrast exam. Gallbladder is unremarkable.  Pancreas: No mass or inflammatory process visualized on this non-contrast exam.  Spleen:  Within normal limits in size.  Adrenal Glands/Kidneys: No adrenal mass identified. Mild bilateral renal parenchymal scarring and atrophy noted. Tiny punctate calcifications could be due to nonobstructing bilateral renal calculi versus vascular calcification. No evidence of hydronephrosis.  Stomach/Bowel/Peritoneum:  Unremarkable.  Vascular/Lymphatic: No pathologically enlarged lymph nodes identified. 3.1 cm infrarenal abdominal aortic aneurysm noted. No evidence of retroperitoneal hemorrhage.  Other:  None.  Musculoskeletal:  No suspicious bone lesions identified.  IMPRESSION: Mild bilateral renal parenchymal scarring and atrophy. No evidence of hydronephrosis.  Tiny nonobstructing bilateral renal calculi versus vascular calcifications.  3.1 cm infrarenal abdominal aortic aneurysm. No evidence of aneurysm leak or rupture. Recommend followup by  ultrasound in 3 years. This recommendation follows ACR consensus guidelines: White Paper of the ACR Incidental Findings Committee II on Vascular Findings. Earlyne Iba Radiol 2013; 10:789-794   Electronically Signed  By: Myles Rosenthal M.D.   On: 01/17/2015 16:23   Dg Chest 2 View  02/03/2015   CLINICAL DATA:  Weakness.  Altered mental status.  EXAM: CHEST  2 VIEW  COMPARISON:  Single view of the chest 01/19/2015.  FINDINGS: Lung volumes are low with mild basilar atelectasis. There is cardiomegaly without edema. No pneumothorax or pleural effusion.  IMPRESSION: No acute finding in a low volume chest.  Cardiomegaly.   Electronically Signed   By: Drusilla Kanner M.D.   On: 02/03/2015 16:37   Dg Chest 2 View  01/13/2015   CLINICAL DATA:  Bilateral lower extremity pain  EXAM: CHEST  2 VIEW  COMPARISON:  None.  FINDINGS: Lungs are clear. Heart is upper normal in size with pulmonary vascularity within normal limits. No adenopathy. No bone lesions.  IMPRESSION: No edema or consolidation.   Electronically Signed   By: Bretta Bang III M.D.   On: 01/13/2015 09:57   Dg Knee 1-2 Views Right  02/06/2015   CLINICAL DATA:  Right knee pain, history of gout  EXAM: RIGHT KNEE - 1-2 VIEW  COMPARISON:  01/17/2015  FINDINGS: Small joint effusion is again identified. Some generalized soft tissue swelling about the knee is seen. No acute fracture or dislocation is noted. Mild degenerative changes are seen.  IMPRESSION: The overall appearance is stable from the prior exam. No acute bony abnormality is seen.   Electronically Signed   By: Alcide Clever M.D.   On: 02/06/2015 08:32   Dg Knee 1-2 Views Right  01/17/2015   CLINICAL DATA:  Right knee pain and swelling for 2-3 days. No known injury. Gout.  EXAM: RIGHT KNEE - 1-2 VIEW  COMPARISON:  None.  FINDINGS: No evidence of acute fracture or dislocation. Diffuse soft tissue swelling is seen most severe in the suprasellar region. Small knee joint effusion cannot be excluded. No  evidence joint space narrowing, osteophytosis, or periarticular erosions. Peripheral vascular calcification noted.  IMPRESSION: Diffuse soft tissue swelling. Small knee joint effusion cannot be excluded. No osseous abnormality identified.   Electronically Signed   By: Myles Rosenthal M.D.   On: 01/17/2015 14:03   Ct Head Wo Contrast  02/03/2015   CLINICAL DATA:  Mental status changes.  Schizophrenia.  EXAM: CT HEAD WITHOUT CONTRAST  TECHNIQUE: Contiguous axial images were obtained from the base of the skull through the vertex without intravenous contrast.  COMPARISON:  None.  FINDINGS: Sinuses/Soft tissues: Hypoplastic frontal sinuses. Other paranasal sinuses and mastoid air cells are clear.  Intracranial: Moderate low density in the periventricular white matter likely related to small vessel disease. Mild cerebral atrophy for age. Ventriculomegaly is mild and felt to be secondary to cerebral atrophy. Dense atherosclerosis in the bilateral vertebral and carotid arteries. No mass lesion, hemorrhage, acute infarct, intra-axial, or extra-axial fluid collection.  IMPRESSION: 1.  No acute intracranial abnormality. 2.  Cerebral atrophy and small vessel ischemic change. 3. Atherosclerosis.   Electronically Signed   By: Jeronimo Greaves M.D.   On: 02/03/2015 16:44   Mr Brain Wo Contrast (neuro Protocol)  02/03/2015   CLINICAL DATA:  Initial evaluation for acute onset confusion, altered mental status, history of schizophrenia.  EXAM: MRI HEAD WITHOUT CONTRAST  TECHNIQUE: Multiplanar, multiecho pulse sequences of the brain and surrounding structures were obtained without intravenous contrast.  COMPARISON:  Prior CT from earlier the same day.  FINDINGS: Study is somewhat degraded by motion artifact.  Diffuse prominence of the CSF containing spaces is compatible with generalized  cerebral atrophy. Patchy and confluent T2/FLAIR hyperintensity within the periventricular and deep white matter both cerebral hemispheres noted,  nonspecific, but most likely related to chronic small vessel ischemic changes.  No abnormal foci of restricted diffusion to suggest acute intracranial infarct. Tiny mildly intense focus of signal intensity seen at the midline at the anterior genu of corpus callosum favored to be artifactual in nature (series 4, image 33). [name]-white matter differentiation maintained. Normal intravascular flow voids are present. Small remote lacunar infarct present within the right basal ganglia.  No acute or chronic intracranial hemorrhage identified.  No mass lesion or midline shift. Mild ventricular prominence related global parenchymal volume loss present without hydrocephalus. No extra-axial fluid collection.  Craniocervical junction within normal limits. Pituitary gland is normal. No acute abnormality seen about the orbits.  Paranasal sinuses are grossly clear. There is mild scattered fluid density within the right mastoid air cells.  Bone marrow signal intensity within normal limits. Scalp soft tissues unremarkable.  IMPRESSION: 1. Motion degraded study. No acute intracranial infarct or other process identified. 2. Atrophy with moderate chronic microvascular ischemic disease.   Electronically Signed   By: Rise Mu M.D.   On: 02/03/2015 21:33   Mr Maxine Glenn Abdomen Wo Contrast  01/19/2015   CLINICAL DATA:  Unexplained renal failure. Evaluate for renal artery stenosis. Stage 4 kidney disease with cortical thinning.  EXAM: MRA ABDOMEN WITHOUT CONTRAST  TECHNIQUE: Angiographic images of the chest were obtained using MRA technique without intravenous contrast.  CONTRAST:  None  COMPARISON:  CT abdomen pelvis - 01/17/2015 ; renal ultrasound - 01/15/2015  FINDINGS: The examination is degraded secondary to lack of intravenous contrast due to patient's renal insufficiency. Examination is further degraded secondary to patient's overlying left upper extremity  Vascular Findings:  Abdominal aorta: The abdominal aorta is  suboptimally evaluated on this noncontrast examination. The external caliber of the abdominal aorta is unchanged, measuring approximately 3.0 cm in maximal oblique axial dimension (image 31, series 3).  Celiac artery: There is a minimal amount of eccentric atherosclerotic plaque involving the origin of the celiac artery, not definitely resulting in a hemodynamically significant stenosis. Conventional branching pattern.  SMA: There is a minimal amount of eccentric atherosclerotic plaque involving the origin of the SMA, not definitely resulting in a hemodynamically significant stenosis. The mid and distal aspects of the main trunk of the SMA are suboptimally evaluated.  Right Renal artery: Suboptimally evaluated. There is a apparent minimal amount of focal atherosclerotic plaque involving the origin of the right renal artery though evaluation for hemodynamic significance is degraded secondary to suboptimal vessel enhancement.  Left Renal artery: There is nondiagnostic evaluation of the left renal artery secondary to suboptimal vessel opacification.  IMA: Not evaluated  Review of the MIP images confirms the above findings.   --------------------------------------------------------------------------------  Nonvascular Findings:  Bilateral renal cortical atrophy, left greater than right, appears grossly unchanged. No urinary obstruction or perinephric stranding.  Normal hepatic contour. Normal noncontrast appearance of the gallbladder. Normal noncontrast appearance of the bilateral adrenal glands, pancreas and spleen. Visualized loops of bowel appear normal. No retroperitoneal or mesenteric adenopathy within the imaged upper abdomen.  IMPRESSION: 1. Markedly degraded examination with nondiagnostic evaluation of the left renal artery. There is eccentric atherosclerotic plaque involving the origin of the right renal artery though evaluation for hemodynamic significance is degraded secondary to suboptimal opacification of  the downstream right renal artery. If renal artery stenosis remains of clinical concern, further evaluation could be performed with Renal artery duplex  ultrasound which if abnormal could be confirmed with a CO2 angiogram. 2. Grossly unchanged approximately 3.0 cm infrarenal abdominal aortic aneurysm.   Electronically Signed   By: Simonne Come M.D.   On: 01/19/2015 08:09   US Renal  02/04/2015   CLINICAL DATA:  Acute on chronic renal failure, history smoking, hypertension, altered mental status  EXAM: RENAL/URINARY TRACT ULTRASOUND COMPLETE  COMPARISON:  CT abdomen and pelvis 01/17/2015, renal ultrasound 01/15/2015  FINDINGS: Right Kidney:  Length: 10.0 cm. Cortical thinning. Increased cortical echogenicity. No mass, hydronephrosis or shadowing calcification.  Left Kidney:  Length: 10.1 cm. Cortical thinning. Increased cortical echogenicity. No mass, hydronephrosis or shadowing calcification.  Bladder:  Slightly irregular posterior bladder wall favor trabeculation. Bladder normally distended without definite mass. Prostate gland not visualized.  IMPRESSION: Medical renal disease changes and cortical atrophy of both kidneys.  No gross evidence of renal mass or hydronephrosis.  Question trabeculation of bladder wall, which may be seen with chronic outlet obstruction.   Electronically Signed   By: Ulyses Southward M.D.   On: 02/04/2015 20:15   US Renal  01/15/2015   CLINICAL DATA:  Initial evaluation for renal failure  EXAM: RENAL/URINARY TRACT ULTRASOUND COMPLETE  COMPARISON:  Renal ultrasound 10/03/2003  FINDINGS: Right Kidney:  Length: 10.0 cm. Significant cortical thinning. Increased echogenicity. No hydronephrosis.  Left Kidney:  Length: 9.5 cm. Significant cortical thinning. Increased echogenicity. No hydronephrosis.  Bladder:  Bladder is decompressed by Foley catheter.  IMPRESSION: Similar to prior study there is significant cortical atrophy with evidence of medical renal disease but there are no acute findings.    Electronically Signed   By: Esperanza Heir M.D.   On: 01/15/2015 15:56   Nm Myocar Multi W/spect W/wall Motion / Ef  01/18/2015   CLINICAL DATA:  elevated troponins, tobacco abuse, HTN, GOUT, schizophrenia, bilateral leg pain.  EXAM: MYOCARDIAL IMAGING WITH SPECT (REST AND PHARMACOLOGIC-STRESS)  GATED LEFT VENTRICULAR WALL MOTION STUDY  LEFT VENTRICULAR EJECTION FRACTION  TECHNIQUE: Standard myocardial SPECT imaging was performed after resting intravenous injection of 10 mCi Tc-63m sestamibi. Subsequently, intravenous infusion of Lexiscan was performed under the supervision of the Cardiology staff. At peak effect of the drug, 30 mCi Tc-76m sestamibi was injected intravenously and standard myocardial SPECT imaging was performed. Quantitative gated imaging was also performed to evaluate left ventricular wall motion, and estimate left ventricular ejection fraction.  COMPARISON:  None.  FINDINGS: Perfusion: Mildly decreased activity in the anteroseptal region of the left ventricle and lateral wall, on both rest and stress sequences. No decreased activity in the left ventricle on stress imaging to suggest reversible ischemia or infarction. There is a moderate amount of subdiaphragmatic activity noted on both sequences.  Wall Motion: Normal left ventricular wall motion. No left ventricular dilation.  Left Ventricular Ejection Fraction: 60 %  End diastolic volume 18 ml  End systolic volume 45 ml  IMPRESSION: 1. No reversible ischemia. Lateral and anteroseptal attenuation versus scar.  2. Normal left ventricular wall motion.  3. Left ventricular ejection fraction 60%  4. Low-risk stress test findings*.  *2012 Appropriate Use Criteria for Coronary Revascularization Focused Update: J Am Coll Cardiol. 2012;59(9):857-881. http://content.dementiazones.com.aspx?articleid=1201161   Electronically Signed   By: Corlis Leak M.D.   On: 01/18/2015 14:26   Dg Chest Port 1 View  02/06/2015   CLINICAL DATA:  CHF  EXAM: PORTABLE  CHEST - 1 VIEW  COMPARISON:  02/05/2015  FINDINGS: Cardiac shadow is stable. The vascular congestion seen on the previous exam has improved  slightly in the interval. Better aeration is noted bilaterally. No focal infiltrate is seen.  IMPRESSION: Resolving CHF.   Electronically Signed   By: Alcide Clever M.D.   On: 02/06/2015 08:32   Dg Chest Port 1 View  02/05/2015   CLINICAL DATA:  Fever and chest crackles  EXAM: PORTABLE CHEST - 1 VIEW  COMPARISON:  02/03/2015  FINDINGS: Chronic cardiomegaly. There is stable upper mediastinal widening. There is new diffuse interstitial opacity, especially prominent at the hila. No effusion or pneumothorax.  IMPRESSION: New pulmonary edema.   Electronically Signed   By: Marnee Spring M.D.   On: 02/05/2015 03:40   Dg Chest Port 1 View  01/19/2015   CLINICAL DATA:  Sudden onset of fever.  EXAM: PORTABLE CHEST - 1 VIEW  COMPARISON:  01/13/2015  FINDINGS: A single AP portable view of the chest demonstrates no focal airspace consolidation or alveolar edema. The lungs are grossly clear. There is no large effusion or pneumothorax. Cardiac and mediastinal contours appear unremarkable.  There is no significant interval change.  IMPRESSION: No acute finding   Electronically Signed   By: Ellery Plunk M.D.   On: 01/19/2015 01:37   Dg Foot 2 Views Left  01/18/2015   CLINICAL DATA:  Diffuse pain. History of gout. No history of trauma  EXAM: LEFT FOOT - 2 VIEW  COMPARISON:  None.  FINDINGS: Frontal and lateral views were obtained. There is no demonstrable fracture or dislocation. There is mild erosive change in the first MTP joint. There is no other erosive change. There is soft tissue swelling dorsally. Other joint spaces appear intact. There is a minimal inferior calcaneal spur.  IMPRESSION: Erosive change first MTP joint. Suspect gout. No fracture or dislocation. Soft tissue swelling dorsally.   Electronically Signed   By: Bretta Bang III M.D.   On: 01/18/2015 17:15   Dg  Swallowing Func-speech Pathology  02/04/2015    Objective Swallowing Evaluation:    Patient Details  Name: Jakwan Sally MRN: [id number] Date of Birth: Apr 06, 1951  Today's Date: 02/04/2015 Time: SLP Start Time (ACUTE ONLY): 1335-SLP Stop Time (ACUTE ONLY): 1354 SLP Time Calculation (min) (ACUTE ONLY): 19 min  Past Medical History:  Past Medical History  Diagnosis Date  . Gout   . Hypertension   . Schizophrenia   . Tobacco abuse    Past Surgical History:  Past Surgical History  Procedure Laterality Date  . None     HPI:  HPI: 64 yo male adm to [facility] with AMS- suspected UTI.  Pt with elevated  troponins, CKD stage IV, anemia.  PMH + for schizophrenia.  CT head  negative, MRI negative, CXR negative.  Pt failed RNSSS and SLP evaluation  ordered.    No Data Recorded  Assessment / Plan / Recommendation CHL IP CLINICAL IMPRESSIONS 02/04/2015  Dysphagia Diagnosis Moderate oral phase dysphagia;Severe oral phase  dysphagia;Moderate pharyngeal phase dysphagia  Clinical impression Moderately severe oropharyngeal dysphagia with  sensorimotor deficits.  Pt did not aspirate any consistency tested - trace  laryngeal penetration of thin noted that cleared with further swallows.   Severe delay in oral transiting (up to 12 seconds) noted with pt requiring  verbal cues to swallow -  delay was worse with increased viscocity.   Piecemealing noted which was effective.  Pt pharyngeal swallow  characterized by delay and weakness with resultant residuals without pt  sensation.  CUED dry swallows effective to decrease residuals.    Recommend pt consume full liquid diet initially  with strict aspiration  precautions.  Using live video, educated pt to findings, recommendations.     Will follow for readiness for dietary advancement.  SLP set up oral  suction in pt's room to use prn.  Hopeful for pt swallow to improve during  acute stay given his good po tolerance during last admit and negative  MRI/CT.        CHL IP TREATMENT RECOMMENDATION 02/04/2015   Treatment Plan Recommendations Therapy as outlined in treatment plan below      CHL IP DIET RECOMMENDATION 02/04/2015  Diet Recommendations Thin liquid  Liquid Administration via Cup  Medication Administration Crushed with puree  Compensations Slow rate;Small sips/bites  Postural Changes and/or Swallow Maneuvers Seated upright 90  degrees;Upright 30-60 min after meal           CHL IP FREQUENCY AND DURATION 02/04/2015  Speech Therapy Frequency (ACUTE ONLY) min 2x/week  Treatment Duration 2 weeks         CHL IP REASON FOR REFERRAL 02/04/2015  Reason for Referral Objectively evaluate swallowing function     CHL IP ORAL PHASE 02/04/2015                                      Oral - Nectar Teaspoon Pocketing in anterior sulcus;Holding of  bolus;Delayed oral transit  Oral - Nectar Cup Delayed oral transit;Weak lingual manipulation;Piecemeal  swallowing;Reduced posterior propulsion;Incomplete tongue to palate  contact;Lingual/palatal residue;Holding of bolus           Oral - Thin Teaspoon Delayed oral transit;Weak lingual  manipulation;Piecemeal swallowing;Reduced posterior propulsion;Incomplete  tongue to palate contact;Lingual/palatal residue;Holding of bolus  Oral - Thin Cup Weak lingual manipulation;Delayed oral transit;Piecemeal  swallowing;Reduced posterior propulsion;Incomplete tongue to palate  contact;Lingual/palatal residue;Holding of bolus  Oral - Thin Straw Weak lingual manipulation;Delayed oral transit;Piecemeal  swallowing;Reduced posterior propulsion;Incomplete tongue to palate  contact;Lingual/palatal residue;Holding of bolus  Oral - Thin Syringe (None)  Oral - Puree Weak lingual manipulation;Delayed oral transit;Piecemeal  swallowing;Reduced posterior propulsion;Pocketing in anterior  sulcus;Incomplete tongue to palate contact;Lingual/palatal residue;Holding  of bolus  Oral - Mechanical Soft Weak lingual manipulation;Delayed oral  transit;Piecemeal swallowing;Reduced posterior propulsion;Pocketing in  anterior  sulcus;Incomplete tongue to palate contact;Lingual/palatal  residue;Holding of bolus;Impaired mastication        Oral - Pill (None)  Oral Phase - Comment cues to swallow effective albeit pt with continued  delay, pt did not intiate swallow with first bolus of nectar thick barium-  required larger bolus to elicit transit *spillage into pharnx      CHL IP PHARYNGEAL PHASE 02/04/2015  Pharyngeal Phase Impaired                                Pharyngeal - Nectar Teaspoon Delayed swallow initiation;Premature spillage  to pyriform sinuses;Lateral channel residue  Penetration/Aspiration details (nectar teaspoon) (None)  Pharyngeal - Nectar Cup Delayed swallow initiation;Reduced pharyngeal  peristalsis;Reduced tongue base retraction;Premature spillage to  valleculae;Lateral channel residue                       Pharyngeal - Thin Teaspoon Delayed swallow initiation;Reduced tongue base  retraction;Reduced pharyngeal peristalsis;Lateral channel residue  Penetration/Aspiration details (thin teaspoon) (None)  Pharyngeal - Thin Cup Delayed swallow initiation;Reduced pharyngeal  peristalsis;Reduced tongue base retraction;Lateral channel residue  Penetration/Aspiration details (thin cup) (None)  Pharyngeal - Thin Straw  Delayed swallow initiation;Reduced tongue base  retraction;Reduced pharyngeal peristalsis;Lateral channel residue           Pharyngeal - Puree Delayed swallow initiation;Reduced tongue base  retraction;Reduced pharyngeal peristalsis;Pharyngeal residue - valleculae  Penetration/Aspiration details (puree) (None)  Pharyngeal - Mechanical Soft Delayed swallow initiation;Reduced tongue  base retraction;Reduced pharyngeal peristalsis;Pharyngeal residue -  valleculae                       Pharyngeal Comment pt required verbal cues to conduct dry swallows to  faciliate clearance of pharynx - he does not sense residuals which will  increase his aspiration risk     CHL IP CERVICAL ESOPHAGEAL PHASE 02/04/2015  Cervical Esophageal  Phase [facility]                                                      Donavan Burnet, MS [facility] SLP [phone]          Subjective:  patient is more alert but remains pleasant and confused. Denies any fevers, chills, chest pain, shortness breath, abdominal pain, headache. No vomiting. No respiratory distress.  Objective: Filed Vitals:   02/07/15 0118 02/07/15 0415 02/07/15 0500 02/07/15 1321  BP: 132/74 136/82  94/60  Pulse: 113 114  78  Temp: 98.1 F (36.7 C) 99.6 F (37.6 C)  98.3 F (36.8 C)  TempSrc: Oral Oral  Oral  Resp: 16 16  16   Height:      Weight:   55.9 kg (123 lb 3.8 oz)   SpO2:  100%  100%    Intake/Output Summary (Last 24 hours) at 02/07/15 1603 Last data filed at 02/07/15 1500  Gross per 24 hour  Intake    580 ml  Output   1951 ml  Net  -1371 ml   Weight change:  Exam:   General:  Pt is alert, follows commands appropriately, not in acute distress  HEENT: No icterus, No thrush,[place]/AT  Cardiovascular: RRR, S1/S2, no rubs, no gallops  Respiratory: CTA bilaterally, no wheezing, no crackles, no rhonchi  Abdomen: Soft/+BS, non tender, non distended, no guarding  Extremities: No edema, No lymphangitis, No petechiae, No rashes or synovitis of his hands and ankles.   Data Reviewed: Basic Metabolic Panel:  Recent Labs Lab 02/03/15 1500 02/05/15 0517 02/06/15 0820 02/07/15 0507  NA 138 137 138 137  K 5.2* 4.4 4.3 3.9  CL 102 110 110 102  CO2 21 17* 18* 20  GLUCOSE 150* 213* 179* 156*  BUN 64* 52* 50* 55*  CREATININE 4.54* 3.36* 3.11* 3.37*  CALCIUM 8.6 8.3* 8.7 8.5   Liver Function Tests:  Recent Labs Lab 02/03/15 1500 02/06/15 0820  AST 24 46*  ALT 20 30  ALKPHOS 130* 154*  BILITOT 0.3 0.9  PROT 6.8 6.9  ALBUMIN 2.1* 1.9*   No results for input(s): LIPASE, AMYLASE in the last 168 hours.  Recent Labs Lab 02/03/15 1614  AMMONIA 42*   CBC:  Recent Labs Lab 02/03/15 1500 02/05/15 0517 02/06/15 0820 02/07/15 0507  WBC 10.9* 11.5*  13.0* 16.6*  NEUTROABS 9.0* 10.2*  --   --   HGB 7.3* 6.9* 8.3* 7.9*  HCT 23.8* 22.4* 25.7* 24.5*  MCV 99.2 98.7 94.8 93.9  PLT 254 243 269 257   Cardiac Enzymes:  Recent Labs Lab 02/03/15 1642  TROPONINI 0.96*   BNP: Invalid input(s): POCBNP CBG:  Recent Labs Lab 02/06/15 1203 02/06/15 1602 02/06/15 2005 02/07/15 0741 02/07/15 1145  GLUCAP 255* 188* 130* 165* 168*    Recent Results (from the past 240 hour(s))  Culture, Urine     Status: None   Collection Time: 02/03/15  9:00 PM  Result Value Ref Range Status   Specimen Description URINE, RANDOM  Final   Special Requests NONE  Final   Colony Count NO GROWTH [facility]formed at [facility]   Final   Culture NO GROWTH Performed at [facility]   Final   Report Status 02/05/2015 FINAL  Final  MRSA PCR Screening     Status: None   Collection Time: 02/04/15 12:34 AM  Result Value Ref Range Status   MRSA by PCR NEGATIVE NEGATIVE Final    Comment:        The GeneXpert MRSA Assay (FDA approved for NASAL specimens only), is one component of a comprehensive MRSA colonization surveillance program. It is not intended to diagnose MRSA infection nor to guide or monitor treatment for MRSA infections.   Culture, blood (routine x 2)     Status: None   Collection Time: 02/05/15  5:15 AM  Result Value Ref Range Status   Specimen Description BLOOD RIGHT FOREARM  Final   Special Requests BOTTLES DRAWN AEROBIC ONLY 5ML  Final   Culture   Final    STAPHYLOCOCCUS SPECIES (COAGULASE NEGATIVE) Note: THE SIGNIFICANCE OF ISOLATING THIS ORGANISM FROM A SINGLE SET OF BLOOD CULTURES WHEN MULTIPLE SETS ARE DRAWN IS UNCERTAIN. PLEASE NOTIFY THE MICROBIOLOGY DEPARTMENT WITHIN ONE WEEK IF SPECIATION AND SENSITIVITIES ARE REQUIRED. Note: Gram Stain Report Called to,Read Back By and Verified With: LIRA VERDELDIDIOS AT 4:32 A.M. ON 02/06/2015 WARRB Performed at [facility]    Report Status 02/07/2015 FINAL  Final   Culture, blood (routine x 2)     Status: None (Preliminary result)   Collection Time: 02/05/15  5:17 AM  Result Value Ref Range Status   Specimen Description BLOOD RIGHT HAND  Final   Special Requests BOTTLES DRAWN AEROBIC ONLY 3ML  Final   Culture   Final           BLOOD CULTURE RECEIVED NO GROWTH TO DATE CULTURE WILL BE HELD FOR 5 DAYS BEFORE ISSUING A FINAL NEGATIVE REPORT Performed at [facility]    Report Status PENDING  Incomplete  Culture, Urine     Status: None   Collection Time: 02/05/15  4:42 PM  Result Value Ref Range Status   Specimen Description URINE, CLEAN CATCH  Final   Special Requests NONE  Final   Colony Count   Final    >=100,000 COLONIES/ML Performed at [facility]    Culture   Final    Multiple bacterial morphotypes present, none predominant. Suggest appropriate recollection if clinically indicated. Performed at [facility]    Report Status 02/06/2015 FINAL  Final     Scheduled Meds: . antiseptic oral rinse  7 mL Mouth Rinse BID  . aspirin  325 mg Oral Daily   Or  . aspirin  300 mg Rectal Daily  . enoxaparin (LOVENOX) injection  30 mg Subcutaneous QHS  . ferrous sulfate  325 mg Oral TID WC  . folic acid  1 mg Oral Daily  . insulin aspart  0-9 Units Subcutaneous TID WC  . metoprolol tartrate  25 mg Oral BID  . piperacillin-tazobactam (ZOSYN)  IV  2.25 g Intravenous Q8H  . vancomycin  750 mg Intravenous Q48H  . vitamin B-12  250 mcg Oral Daily   Continuous Infusions:    Johnross Nabozny, DO  [facility] Pager [phone]  If 7PM-7AM, please contact night-coverage [email or web address] Password [email or web address] 02/07/2015, 4:03 PM   LOS: 4 days

## 2015-02-08 LAB — CBC
HEMATOCRIT: 25.1 % — AB (ref 39.0–52.0)
HEMOGLOBIN: 8.1 g/dL — AB (ref 13.0–17.0)
MCH: 30.2 pg (ref 26.0–34.0)
MCHC: 32.3 g/dL (ref 30.0–36.0)
MCV: 93.7 fL (ref 78.0–100.0)
Platelets: 233 10*3/uL (ref 150–400)
RBC: 2.68 MIL/uL — ABNORMAL LOW (ref 4.22–5.81)
RDW: 15.6 % — ABNORMAL HIGH (ref 11.5–15.5)
WBC: 15.5 10*3/uL — ABNORMAL HIGH (ref 4.0–10.5)

## 2015-02-08 LAB — COMPREHENSIVE METABOLIC PANEL
ALK PHOS: 273 U/L — AB (ref 39–117)
ALT: 61 U/L — AB (ref 0–53)
ANION GAP: 13 (ref 5–15)
AST: 80 U/L — AB (ref 0–37)
Albumin: 2 g/dL — ABNORMAL LOW (ref 3.5–5.2)
BILIRUBIN TOTAL: 0.6 mg/dL (ref 0.3–1.2)
BUN: 65 mg/dL — ABNORMAL HIGH (ref 6–23)
CALCIUM: 8.2 mg/dL — AB (ref 8.4–10.5)
CO2: 19 mmol/L (ref 19–32)
CREATININE: 3.55 mg/dL — AB (ref 0.50–1.35)
Chloride: 100 mmol/L (ref 96–112)
GFR, EST AFRICAN AMERICAN: 20 mL/min — AB (ref >90)
GFR, EST NON AFRICAN AMERICAN: 17 mL/min — AB (ref >90)
Glucose, Bld: 229 mg/dL — ABNORMAL HIGH (ref 70–99)
Potassium: 4.5 mmol/L (ref 3.5–5.1)
Sodium: 132 mmol/L — ABNORMAL LOW (ref 135–145)
TOTAL PROTEIN: 6.5 g/dL (ref 6.0–8.3)

## 2015-02-08 LAB — GLUCOSE, CAPILLARY
GLUCOSE-CAPILLARY: 213 mg/dL — AB (ref 70–99)
GLUCOSE-CAPILLARY: 228 mg/dL — AB (ref 70–99)
GLUCOSE-CAPILLARY: 234 mg/dL — AB (ref 70–99)
Glucose-Capillary: 191 mg/dL — ABNORMAL HIGH (ref 70–99)
Glucose-Capillary: 356 mg/dL — ABNORMAL HIGH (ref 70–99)
Glucose-Capillary: 331 mg/dL — ABNORMAL HIGH (ref 70–99)

## 2015-02-08 LAB — CLOSTRIDIUM DIFFICILE BY PCR: Toxigenic C. Difficile by PCR: NEGATIVE

## 2015-02-08 LAB — PROCALCITONIN: PROCALCITONIN: 15.5 ng/mL

## 2015-02-08 MED ORDER — INSULIN ASPART 100 UNIT/ML ~~LOC~~ SOLN
0.0000 [IU] | Freq: Every day | SUBCUTANEOUS | Status: DC
  Administered 2015-02-08: 4 [IU] via SUBCUTANEOUS

## 2015-02-08 MED ORDER — INSULIN ASPART 100 UNIT/ML ~~LOC~~ SOLN
0.0000 [IU] | Freq: Three times a day (TID) | SUBCUTANEOUS | Status: DC
  Administered 2015-02-09: 5 [IU] via SUBCUTANEOUS
  Administered 2015-02-09: 3 [IU] via SUBCUTANEOUS

## 2015-02-08 NOTE — Progress Notes (Signed)
PROGRESS NOTE  Parley Pidcock PPI:[id number] DOB: 1951-03-15 DOA: 02/03/2015 PCP: Ralene Ok, MD   Brief History 64 y/o male with history of HTN, paranoid schizophrenia, CKD4, gout, anemia, DM2, presented 02/03/2015 from [facility] with altered mental status. Apparently the patient was not acting right when he went to [facility]. The patient is a poor historian. Since admission, the patient's hospitalization has been complicated by recurrent fever and pulmonary edema. The patient was also found to have acute on chronic renal failure. Assessment/Plan: Acute encephalopathy -Appears to be somewhat better than the day of admission -Multifactorial including anemia, acute on chronic renal failure, pulmonary edema, possible infectious process, and metabolic derangements -B12, folic acid, TSH, RPR, HIV are negative -Ammonia mildly elevated at 42 Fever -Suspect this is likely due to gouty arthritis flare -cultures unrevealing -Blood culture likely contaminant with CoNS -Urinalysis without significant pyuria -Chest x-ray without any infiltrates -Discontinue antibiotics and observe  Bacteremia  - likely contaminant with CoNS in 1 of 2 sets Discontinue vancomycin al ultrasound negative for hydronephrosis  -Monitor closely with diuresis  Acute on chronic diastolic CHF  -02/04/2015 echo--EF 55-60%, grade 1 diastolic dysfunction  -Daily weights --neg 8 pounds -Check BNP--1559  -No respiratory distress. Presently stable on room air  -pt urinated 4.5L with 2 doses lasix IV Acute on chronic renal failure (CKD4) -baseline creatinine 2.6-3.0 -admission creatinine 4.54 -02/08/15 creatinine 3.55 Elevated troponin  -Doubt ACS -Likely demand ischemia in the setting of fever, CKD, and CHF -EKG without concerning ischemic changes -Presently without any chest pain Gouty arthritis/hyperuricemia -check uric acid--11.8 -Xray right knee--no bony abnormality, small effusion  -start solumedrol   IV daily-->clinically improving Diabetes Mellitus -continue novolog sliding scale -pt was not on any meds according to [facility] from SNF -A1C 6.9 -As CBGs are climbing from Solu-Medrol, changed to moderate scale Dysphagia -Modified barium swallow shows moderate to severe oropharyngeal dysphagia  -Appreciate speech therapy follow-up  -Started on dysphagia 1 diet  Macrocytic anemia  -01/15/2015 patient had a low B12  -Will supplement  -Check RBC folate  -Patient had negative Hemoccult on 01/16/2015, 02/03/2015 and 02/08/15 Hypertension -continue metoprolol-->change to po -fair control  Family Communication: Pt at beside Disposition Plan: SNF when medically stable    Procedures/Studies: Ct Abdomen Wo Contrast  Feb 12, 2015   CLINICAL DATA:  Stage 4 chronic kidney disease.  EXAM: CT ABDOMEN WITHOUT CONTRAST  TECHNIQUE: Multidetector CT imaging of the abdomen was performed following the standard protocol without IV contrast.  COMPARISON:  None.  FINDINGS: Lower chest: Mild cardiomegaly noted. Tiny bilateral pleural effusions versus pleural thickening.  Hepatobiliary: No mass visualized on this non-contrast exam. Gallbladder is unremarkable.  Pancreas: No mass or inflammatory process visualized on this non-contrast exam.  Spleen:  Within normal limits in size.  Adrenal Glands/Kidneys: No adrenal mass identified. Mild bilateral renal parenchymal scarring and atrophy noted. Tiny punctate calcifications could be due to nonobstructing bilateral renal calculi versus vascular calcification. No evidence of hydronephrosis.  Stomach/Bowel/Peritoneum:  Unremarkable.  Vascular/Lymphatic: No pathologically enlarged lymph nodes identified. 3.1 cm infrarenal abdominal aortic aneurysm noted. No evidence of retroperitoneal hemorrhage.  Other:  None.  Musculoskeletal:  No suspicious bone lesions identified.  IMPRESSION: Mild bilateral renal parenchymal scarring and atrophy. No evidence of hydronephrosis.   Tiny nonobstructing bilateral renal calculi versus vascular calcifications.  3.1 cm infrarenal abdominal aortic aneurysm. No evidence of aneurysm leak or rupture. Recommend followup by ultrasound in 3 years. This recommendation follows ACR consensus guidelines: White  Paper of the ACR Incidental Findings Committee II on Vascular Findings. [place] Coll Radiol 2013; 10:789-794   Electronically Signed   By: Myles Rosenthal M.D.   On: 01/17/2015 16:23   Dg Chest 2 View  02/03/2015   CLINICAL DATA:  Weakness.  Altered mental status.  EXAM: CHEST  2 VIEW  COMPARISON:  Single view of the chest 01/19/2015.  FINDINGS: Lung volumes are low with mild basilar atelectasis. There is cardiomegaly without edema. No pneumothorax or pleural effusion.  IMPRESSION: No acute finding in a low volume chest.  Cardiomegaly.   Electronically Signed   By: Drusilla Kanner M.D.   On: 02/03/2015 16:37   Dg Chest 2 View  01/13/2015   CLINICAL DATA:  Bilateral lower extremity pain  EXAM: CHEST  2 VIEW  COMPARISON:  None.  FINDINGS: Lungs are clear. Heart is upper normal in size with pulmonary vascularity within normal limits. No adenopathy. No bone lesions.  IMPRESSION: No edema or consolidation.   Electronically Signed   By: Bretta Bang III M.D.   On: 01/13/2015 09:57   Dg Knee 1-2 Views Right  02/06/2015   CLINICAL DATA:  Right knee pain, history of gout  EXAM: RIGHT KNEE - 1-2 VIEW  COMPARISON:  01/17/2015  FINDINGS: Small joint effusion is again identified. Some generalized soft tissue swelling about the knee is seen. No acute fracture or dislocation is noted. Mild degenerative changes are seen.  IMPRESSION: The overall appearance is stable from the prior exam. No acute bony abnormality is seen.   Electronically Signed   By: Alcide Clever M.D.   On: 02/06/2015 08:32   Dg Knee 1-2 Views Right  01/17/2015   CLINICAL DATA:  Right knee pain and swelling for 2-3 days. No known injury. Gout.  EXAM: RIGHT KNEE - 1-2 VIEW  COMPARISON:  None.   FINDINGS: No evidence of acute fracture or dislocation. Diffuse soft tissue swelling is seen most severe in the suprasellar region. Small knee joint effusion cannot be excluded. No evidence joint space narrowing, osteophytosis, or periarticular erosions. Peripheral vascular calcification noted.  IMPRESSION: Diffuse soft tissue swelling. Small knee joint effusion cannot be excluded. No osseous abnormality identified.   Electronically Signed   By: Myles Rosenthal M.D.   On: 01/17/2015 14:03   Ct Head Wo Contrast  02/03/2015   CLINICAL DATA:  Mental status changes.  Schizophrenia.  EXAM: CT HEAD WITHOUT CONTRAST  TECHNIQUE: Contiguous axial images were obtained from the base of the skull through the vertex without intravenous contrast.  COMPARISON:  None.  FINDINGS: Sinuses/Soft tissues: Hypoplastic frontal sinuses. Other paranasal sinuses and mastoid air cells are clear.  Intracranial: Moderate low density in the periventricular white matter likely related to small vessel disease. Mild cerebral atrophy for age. Ventriculomegaly is mild and felt to be secondary to cerebral atrophy. Dense atherosclerosis in the bilateral vertebral and carotid arteries. No mass lesion, hemorrhage, acute infarct, intra-axial, or extra-axial fluid collection.  IMPRESSION: 1.  No acute intracranial abnormality. 2.  Cerebral atrophy and small vessel ischemic change. 3. Atherosclerosis.   Electronically Signed   By: Jeronimo Greaves M.D.   On: 02/03/2015 16:44   Mr Brain Wo Contrast (neuro Protocol)  02/03/2015   CLINICAL DATA:  Initial evaluation for acute onset confusion, altered mental status, history of schizophrenia.  EXAM: MRI HEAD WITHOUT CONTRAST  TECHNIQUE: Multiplanar, multiecho pulse sequences of the brain and surrounding structures were obtained without intravenous contrast.  COMPARISON:  Prior CT from earlier the  same day.  FINDINGS: Study is somewhat degraded by motion artifact.  Diffuse prominence of the CSF containing spaces is  compatible with generalized cerebral atrophy. Patchy and confluent T2/FLAIR hyperintensity within the periventricular and deep white matter both cerebral hemispheres noted, nonspecific, but most likely related to chronic small vessel ischemic changes.  No abnormal foci of restricted diffusion to suggest acute intracranial infarct. Tiny mildly intense focus of signal intensity seen at the midline at the anterior genu of corpus callosum favored to be artifactual in nature (series 4, image 33). Gray-white matter differentiation maintained. Normal intravascular flow voids are present. Small remote lacunar infarct present within the right basal ganglia.  No acute or chronic intracranial hemorrhage identified.  No mass lesion or midline shift. Mild ventricular prominence related global parenchymal volume loss present without hydrocephalus. No extra-axial fluid collection.  Craniocervical junction within normal limits. Pituitary gland is normal. No acute abnormality seen about the orbits.  Paranasal sinuses are grossly clear. There is mild scattered fluid density within the right mastoid air cells.  Bone marrow signal intensity within normal limits. Scalp soft tissues unremarkable.  IMPRESSION: 1. Motion degraded study. No acute intracranial infarct or other process identified. 2. Atrophy with moderate chronic microvascular ischemic disease.   Electronically Signed   By: Rise MuBenjamin  McClintock M.D.   On: 02/03/2015 21:33   Mr Maxine GlennMra Abdomen Wo Contrast  01/19/2015   CLINICAL DATA:  Unexplained renal failure. Evaluate for renal artery stenosis. Stage 4 kidney disease with cortical thinning.  EXAM: MRA ABDOMEN WITHOUT CONTRAST  TECHNIQUE: Angiographic images of the chest were obtained using MRA technique without intravenous contrast.  CONTRAST:  None  COMPARISON:  CT abdomen pelvis - 01/17/2015 ; renal ultrasound - 01/15/2015  FINDINGS: The examination is degraded secondary to lack of intravenous contrast due to patient's  renal insufficiency. Examination is further degraded secondary to patient's overlying left upper extremity  Vascular Findings:  Abdominal aorta: The abdominal aorta is suboptimally evaluated on this noncontrast examination. The external caliber of the abdominal aorta is unchanged, measuring approximately 3.0 cm in maximal oblique axial dimension (image 31, series 3).  Celiac artery: There is a minimal amount of eccentric atherosclerotic plaque involving the origin of the celiac artery, not definitely resulting in a hemodynamically significant stenosis. Conventional branching pattern.  SMA: There is a minimal amount of eccentric atherosclerotic plaque involving the origin of the SMA, not definitely resulting in a hemodynamically significant stenosis. The mid and distal aspects of the main trunk of the SMA are suboptimally evaluated.  Right Renal artery: Suboptimally evaluated. There is a apparent minimal amount of focal atherosclerotic plaque involving the origin of the right renal artery though evaluation for hemodynamic significance is degraded secondary to suboptimal vessel enhancement.  Left Renal artery: There is nondiagnostic evaluation of the left renal artery secondary to suboptimal vessel opacification.  IMA: Not evaluated  Review of the MIP images confirms the above findings.   --------------------------------------------------------------------------------  Nonvascular Findings:  Bilateral renal cortical atrophy, left greater than right, appears grossly unchanged. No urinary obstruction or perinephric stranding.  Normal hepatic contour. Normal noncontrast appearance of the gallbladder. Normal noncontrast appearance of the bilateral adrenal glands, pancreas and spleen. Visualized loops of bowel appear normal. No retroperitoneal or mesenteric adenopathy within the imaged upper abdomen.  IMPRESSION: 1. Markedly degraded examination with nondiagnostic evaluation of the left renal artery. There is eccentric  atherosclerotic plaque involving the origin of the right renal artery though evaluation for hemodynamic significance is degraded secondary to suboptimal opacification  of the downstream right renal artery. If renal artery stenosis remains of clinical concern, further evaluation could be performed with Renal artery duplex ultrasound which if abnormal could be confirmed with a CO2 angiogram. 2. Grossly unchanged approximately 3.0 cm infrarenal abdominal aortic aneurysm.   Electronically Signed   By: Simonne Come M.D.   On: 01/19/2015 08:09   US Renal  02/04/2015   CLINICAL DATA:  Acute on chronic renal failure, history smoking, hypertension, altered mental status  EXAM: RENAL/URINARY TRACT ULTRASOUND COMPLETE  COMPARISON:  CT abdomen and pelvis 01/17/2015, renal ultrasound 01/15/2015  FINDINGS: Right Kidney:  Length: 10.0 cm. Cortical thinning. Increased cortical echogenicity. No mass, hydronephrosis or shadowing calcification.  Left Kidney:  Length: 10.1 cm. Cortical thinning. Increased cortical echogenicity. No mass, hydronephrosis or shadowing calcification.  Bladder:  Slightly irregular posterior bladder wall favor trabeculation. Bladder normally distended without definite mass. Prostate gland not visualized.  IMPRESSION: Medical renal disease changes and cortical atrophy of both kidneys.  No gross evidence of renal mass or hydronephrosis.  Question trabeculation of bladder wall, which may be seen with chronic outlet obstruction.   Electronically Signed   By: Ulyses Southward M.D.   On: 02/04/2015 20:15   US Renal  01/15/2015   CLINICAL DATA:  Initial evaluation for renal failure  EXAM: RENAL/URINARY TRACT ULTRASOUND COMPLETE  COMPARISON:  Renal ultrasound 10/03/2003  FINDINGS: Right Kidney:  Length: 10.0 cm. Significant cortical thinning. Increased echogenicity. No hydronephrosis.  Left Kidney:  Length: 9.5 cm. Significant cortical thinning. Increased echogenicity. No hydronephrosis.  Bladder:  Bladder is  decompressed by Foley catheter.  IMPRESSION: Similar to prior study there is significant cortical atrophy with evidence of medical renal disease but there are no acute findings.   Electronically Signed   By: Esperanza Heir M.D.   On: 01/15/2015 15:56   Nm Myocar Multi W/spect W/wall Motion / Ef  01/18/2015   CLINICAL DATA:  elevated troponins, tobacco abuse, HTN, GOUT, schizophrenia, bilateral leg pain.  EXAM: MYOCARDIAL IMAGING WITH SPECT (REST AND PHARMACOLOGIC-STRESS)  GATED LEFT VENTRICULAR WALL MOTION STUDY  LEFT VENTRICULAR EJECTION FRACTION  TECHNIQUE: Standard myocardial SPECT imaging was performed after resting intravenous injection of 10 mCi Tc-47m sestamibi. Subsequently, intravenous infusion of Lexiscan was performed under the supervision of the Cardiology staff. At peak effect of the drug, 30 mCi Tc-92m sestamibi was injected intravenously and standard myocardial SPECT imaging was performed. Quantitative gated imaging was also performed to evaluate left ventricular wall motion, and estimate left ventricular ejection fraction.  COMPARISON:  None.  FINDINGS: Perfusion: Mildly decreased activity in the anteroseptal region of the left ventricle and lateral wall, on both rest and stress sequences. No decreased activity in the left ventricle on stress imaging to suggest reversible ischemia or infarction. There is a moderate amount of subdiaphragmatic activity noted on both sequences.  Wall Motion: Normal left ventricular wall motion. No left ventricular dilation.  Left Ventricular Ejection Fraction: 60 %  End diastolic volume 18 ml  End systolic volume 45 ml  IMPRESSION: 1. No reversible ischemia. Lateral and anteroseptal attenuation versus scar.  2. Normal left ventricular wall motion.  3. Left ventricular ejection fraction 60%  4. Low-risk stress test findings*.  *2012 Appropriate Use Criteria for Coronary Revascularization Focused Update: J Am Coll Cardiol. 2012;59(9):857-881.  http://content.dementiazones.com.aspx?articleid=1201161   Electronically Signed   By: Corlis Leak M.D.   On: 01/18/2015 14:26   Dg Chest Port 1 View  02/06/2015   CLINICAL DATA:  CHF  EXAM: PORTABLE CHEST -  1 VIEW  COMPARISON:  02/05/2015  FINDINGS: Cardiac shadow is stable. The vascular congestion seen on the previous exam has improved slightly in the interval. Better aeration is noted bilaterally. No focal infiltrate is seen.  IMPRESSION: Resolving CHF.   Electronically Signed   By: Alcide Clever M.D.   On: 02/06/2015 08:32   Dg Chest Port 1 View  02/05/2015   CLINICAL DATA:  Fever and chest crackles  EXAM: PORTABLE CHEST - 1 VIEW  COMPARISON:  02/03/2015  FINDINGS: Chronic cardiomegaly. There is stable upper mediastinal widening. There is new diffuse interstitial opacity, especially prominent at the hila. No effusion or pneumothorax.  IMPRESSION: New pulmonary edema.   Electronically Signed   By: Marnee Spring M.D.   On: 02/05/2015 03:40   Dg Chest Port 1 View  01/19/2015   CLINICAL DATA:  Sudden onset of fever.  EXAM: PORTABLE CHEST - 1 VIEW  COMPARISON:  01/13/2015  FINDINGS: A single AP portable view of the chest demonstrates no focal airspace consolidation or alveolar edema. The lungs are grossly clear. There is no large effusion or pneumothorax. Cardiac and mediastinal contours appear unremarkable.  There is no significant interval change.  IMPRESSION: No acute finding   Electronically Signed   By: Ellery Plunk M.D.   On: 01/19/2015 01:37   Dg Foot 2 Views Left  01/18/2015   CLINICAL DATA:  Diffuse pain. History of gout. No history of trauma  EXAM: LEFT FOOT - 2 VIEW  COMPARISON:  None.  FINDINGS: Frontal and lateral views were obtained. There is no demonstrable fracture or dislocation. There is mild erosive change in the first MTP joint. There is no other erosive change. There is soft tissue swelling dorsally. Other joint spaces appear intact. There is a minimal inferior calcaneal spur.   IMPRESSION: Erosive change first MTP joint. Suspect gout. No fracture or dislocation. Soft tissue swelling dorsally.   Electronically Signed   By: Bretta Bang III M.D.   On: 01/18/2015 17:15   Dg Swallowing Func-speech Pathology  02/04/2015    Objective Swallowing Evaluation:    Patient Details  Name: Jasdeep Kepner MRN: [id number] Date of Birth: 1951/10/23  Today's Date: 02/04/2015 Time: SLP Start Time (ACUTE ONLY): 1335-SLP Stop Time (ACUTE ONLY): 1354 SLP Time Calculation (min) (ACUTE ONLY): 19 min  Past Medical History:  Past Medical History  Diagnosis Date  . Gout   . Hypertension   . Schizophrenia   . Tobacco abuse    Past Surgical History:  Past Surgical History  Procedure Laterality Date  . None     HPI:  HPI: 64 yo male adm to [facility] with AMS- suspected UTI.  Pt with elevated  troponins, CKD stage IV, anemia.  PMH + for schizophrenia.  CT head  negative, MRI negative, CXR negative.  Pt failed RNSSS and SLP evaluation  ordered.    No Data Recorded  Assessment / Plan / Recommendation CHL IP CLINICAL IMPRESSIONS 02/04/2015  Dysphagia Diagnosis Moderate oral phase dysphagia;Severe oral phase  dysphagia;Moderate pharyngeal phase dysphagia  Clinical impression Moderately severe oropharyngeal dysphagia with  sensorimotor deficits.  Pt did not aspirate any consistency tested - trace  laryngeal penetration of thin noted that cleared with further swallows.   Severe delay in oral transiting (up to 12 seconds) noted with pt requiring  verbal cues to swallow -  delay was worse with increased viscocity.   Piecemealing noted which was effective.  Pt pharyngeal swallow  characterized by delay and weakness with resultant residuals  without pt  sensation.  CUED dry swallows effective to decrease residuals.    Recommend pt consume full liquid diet initially with strict aspiration  precautions.  Using live video, educated pt to findings, recommendations.     Will follow for readiness for dietary advancement.  SLP set up oral  suction  in pt's room to use prn.  Hopeful for pt swallow to improve during  acute stay given his good po tolerance during last admit and negative  MRI/CT.        CHL IP TREATMENT RECOMMENDATION 02/04/2015  Treatment Plan Recommendations Therapy as outlined in treatment plan below      CHL IP DIET RECOMMENDATION 02/04/2015  Diet Recommendations Thin liquid  Liquid Administration via Cup  Medication Administration Crushed with puree  Compensations Slow rate;Small sips/bites  Postural Changes and/or Swallow Maneuvers Seated upright 90  degrees;Upright 30-60 min after meal           CHL IP FREQUENCY AND DURATION 02/04/2015  Speech Therapy Frequency (ACUTE ONLY) min 2x/week  Treatment Duration 2 weeks         CHL IP REASON FOR REFERRAL 02/04/2015  Reason for Referral Objectively evaluate swallowing function     CHL IP ORAL PHASE 02/04/2015                                      Oral - Nectar Teaspoon Pocketing in anterior sulcus;Holding of  bolus;Delayed oral transit  Oral - Nectar Cup Delayed oral transit;Weak lingual manipulation;Piecemeal  swallowing;Reduced posterior propulsion;Incomplete tongue to palate  contact;Lingual/palatal residue;Holding of bolus           Oral - Thin Teaspoon Delayed oral transit;Weak lingual  manipulation;Piecemeal swallowing;Reduced posterior propulsion;Incomplete  tongue to palate contact;Lingual/palatal residue;Holding of bolus  Oral - Thin Cup Weak lingual manipulation;Delayed oral transit;Piecemeal  swallowing;Reduced posterior propulsion;Incomplete tongue to palate  contact;Lingual/palatal residue;Holding of bolus  Oral - Thin Straw Weak lingual manipulation;Delayed oral transit;Piecemeal  swallowing;Reduced posterior propulsion;Incomplete tongue to palate  contact;Lingual/palatal residue;Holding of bolus  Oral - Thin Syringe (None)  Oral - Puree Weak lingual manipulation;Delayed oral transit;Piecemeal  swallowing;Reduced posterior propulsion;Pocketing in anterior  sulcus;Incomplete tongue to palate  contact;Lingual/palatal residue;Holding  of bolus  Oral - Mechanical Soft Weak lingual manipulation;Delayed oral  transit;Piecemeal swallowing;Reduced posterior propulsion;Pocketing in  anterior sulcus;Incomplete tongue to palate contact;Lingual/palatal  residue;Holding of bolus;Impaired mastication        Oral - Pill (None)  Oral Phase - Comment cues to swallow effective albeit pt with continued  delay, pt did not intiate swallow with first bolus of nectar thick barium-  required larger bolus to elicit transit *spillage into pharnx      CHL IP PHARYNGEAL PHASE 02/04/2015  Pharyngeal Phase Impaired                                Pharyngeal - Nectar Teaspoon Delayed swallow initiation;Premature spillage  to pyriform sinuses;Lateral channel residue  Penetration/Aspiration details (nectar teaspoon) (None)  Pharyngeal - Nectar Cup Delayed swallow initiation;Reduced pharyngeal  peristalsis;Reduced tongue base retraction;Premature spillage to  valleculae;Lateral channel residue                       Pharyngeal - Thin Teaspoon Delayed swallow initiation;Reduced tongue base  retraction;Reduced pharyngeal peristalsis;Lateral channel residue  Penetration/Aspiration details (thin teaspoon) (None)  Pharyngeal - Thin Cup  Delayed swallow initiation;Reduced pharyngeal  peristalsis;Reduced tongue base retraction;Lateral channel residue  Penetration/Aspiration details (thin cup) (None)  Pharyngeal - Thin Straw Delayed swallow initiation;Reduced tongue base  retraction;Reduced pharyngeal peristalsis;Lateral channel residue           Pharyngeal - Puree Delayed swallow initiation;Reduced tongue base  retraction;Reduced pharyngeal peristalsis;Pharyngeal residue - valleculae  Penetration/Aspiration details (puree) (None)  Pharyngeal - Mechanical Soft Delayed swallow initiation;Reduced tongue  base retraction;Reduced pharyngeal peristalsis;Pharyngeal residue -  valleculae                       Pharyngeal Comment pt required verbal cues to  conduct dry swallows to  faciliate clearance of pharynx - he does not sense residuals which will  increase his aspiration risk     CHL IP CERVICAL ESOPHAGEAL PHASE 02/04/2015  Cervical Esophageal Phase [facility]                                                      Donavan Burnet, MS [facility] SLP [phone]          Subjective: Patient denies fevers, chills, headache, chest pain, dyspnea, nausea, vomiting, diarrhea, abdominal pain, dysuria, hematuria   Objective: Filed Vitals:   02/08/15 0526 02/08/15 0914 02/08/15 1357 02/08/15 1734  BP: 127/81 112/67 110/72 117/78  Pulse: 80 91 67 72  Temp: 98.1 F (36.7 C) 97.5 F (36.4 C) 98 F (36.7 C) 97.8 F (36.6 C)  TempSrc: Oral Oral Oral Oral  Resp: 18 18 18 18   Height:      Weight:      SpO2: 100% 100% 100% 100%    Intake/Output Summary (Last 24 hours) at 02/08/15 1740 Last data filed at 02/08/15 1519  Gross per 24 hour  Intake    770 ml  Output   1650 ml  Net   -880 ml   Weight change: -3.1 kg (-6 lb 13.4 oz) Exam:   General:  Pt is alert, follows commands appropriately, not in acute distress  HEENT: No icterus, No thrush,  [place]/AT  Cardiovascular: RRR, S1/S2, no rubs, no gallops  Respiratory: CTA bilaterally, no wheezing, no crackles, no rhonchi  Abdomen: Soft/+BS, non tender, non distended, no guarding  Extremities: No edema, No lymphangitis, No petechiae, No rashes, no synovitis  Data Reviewed: Basic Metabolic Panel:  Recent Labs Lab 02/03/15 1500 02/05/15 0517 02/06/15 0820 02/07/15 0507 02/08/15 0521  NA 138 137 138 137 132*  K 5.2* 4.4 4.3 3.9 4.5  CL 102 110 110 102 100  CO2 21 17* 18* 20 19  GLUCOSE 150* 213* 179* 156* 229*  BUN 64* 52* 50* 55* 65*  CREATININE 4.54* 3.36* 3.11* 3.37* 3.55*  CALCIUM 8.6 8.3* 8.7 8.5 8.2*   Liver Function Tests:  Recent Labs Lab 02/03/15 1500 02/06/15 0820 02/08/15 0521  AST 24 46* 80*  ALT 20 30 61*  ALKPHOS 130* 154* 273*  BILITOT 0.3 0.9 0.6  PROT 6.8 6.9 6.5    ALBUMIN 2.1* 1.9* 2.0*   No results for input(s): LIPASE, AMYLASE in the last 168 hours.  Recent Labs Lab 02/03/15 1614  AMMONIA 42*   CBC:  Recent Labs Lab 02/03/15 1500 02/05/15 0517 02/06/15 0820 02/07/15 0507 02/08/15 0521  WBC 10.9* 11.5* 13.0* 16.6* 15.5*  NEUTROABS 9.0* 10.2*  --   --   --  HGB 7.3* 6.9* 8.3* 7.9* 8.1*  HCT 23.8* 22.4* 25.7* 24.5* 25.1*  MCV 99.2 98.7 94.8 93.9 93.7  PLT 254 243 269 257 233   Cardiac Enzymes:  Recent Labs Lab 02/03/15 1642  TROPONINI 0.96*   BNP: Invalid input(s): POCBNP CBG:  Recent Labs Lab 02/07/15 2353 02/08/15 0459 02/08/15 0723 02/08/15 1115 02/08/15 1518  GLUCAP 234* 213* 191* 356* 228*    Recent Results (from the past 240 hour(s))  Culture, Urine     Status: None   Collection Time: 02/03/15  9:00 PM  Result Value Ref Range Status   Specimen Description URINE, RANDOM  Final   Special Requests NONE  Final   Colony Count NO GROWTH Performed at [facility]   Final   Culture NO GROWTH Performed at [facility]   Final   Report Status 02/05/2015 FINAL  Final  MRSA PCR Screening     Status: None   Collection Time: 02/04/15 12:34 AM  Result Value Ref Range Status   MRSA by PCR NEGATIVE NEGATIVE Final    Comment:        The GeneXpert MRSA Assay (FDA approved for NASAL specimens only), is one component of a comprehensive MRSA colonization surveillance program. It is not intended to diagnose MRSA infection nor to guide or monitor treatment for MRSA infections.   Culture, blood (routine x 2)     Status: None   Collection Time: 02/05/15  5:15 AM  Result Value Ref Range Status   Specimen Description BLOOD RIGHT FOREARM  Final   Special Requests BOTTLES DRAWN AEROBIC ONLY  Final   Culture   Final    STAPHYLOCOCCUS SPECIES (COAGULASE NEGATIVE) Note: THE SIGNIFICANCE OF ISOLATING THIS ORGANISM FROM A SINGLE SET OF BLOOD CULTURES WHEN MULTIPLE SETS ARE DRAWN IS UNCERTAIN. PLEASE  NOTIFY THE MICROBIOLOGY DEPARTMENT WITHIN ONE WEEK IF SPECIATION AND SENSITIVITIES ARE REQUIRED. Note: Gram Stain Report Called to,Read Back By and Verified With: LIRA VERDELDIDIOS AT 4:32 A.M. ON 02/06/2015 WARRB Performed at [facility]    Report Status 02/07/2015 FINAL  Final  Culture, blood (routine x 2)     Status: None (Preliminary result)   Collection Time: 02/05/15  5:17 AM  Result Value Ref Range Status   Specimen Description BLOOD RIGHT HAND  Final   Special Requests BOTTLES DRAWN AEROBIC ONLY  Final   Culture   Final           BLOOD CULTURE RECEIVED NO GROWTH TO DATE CULTURE WILL BE HELD FOR 5 DAYS BEFORE ISSUING A FINAL NEGATIVE REPORT Performed at [facility]    Report Status PENDING  Incomplete  Culture, Urine     Status: None   Collection Time: 02/05/15  4:42 PM  Result Value Ref Range Status   Specimen Description URINE, CLEAN CATCH  Final   Special Requests NONE  Final   Colony Count   Final    >=100,000 COLONIES/ML Performed at [facility]    Culture   Final    Multiple bacterial morphotypes present, none predominant. Suggest appropriate recollection if clinically indicated. Performed at [facility]    Report Status 02/06/2015 FINAL  Final  Clostridium Difficile by PCR     Status: None   Collection Time: 02/08/15  5:07 AM  Result Value Ref Range Status   C difficile by pcr NEGATIVE NEGATIVE Final     Scheduled Meds: . antiseptic oral rinse  7 mL Mouth Rinse BID  .  aspirin  325 mg Oral Daily   Or  . aspirin  300 mg Rectal Daily  . enoxaparin (LOVENOX) injection  30 mg Subcutaneous QHS  . ferrous sulfate  325 mg Oral TID WC  . folic acid  1 mg Oral Daily  . insulin aspart  0-9 Units Subcutaneous TID WC  . methylPREDNISolone (SOLU-MEDROL) injection  60 mg Intravenous Daily  . metoprolol tartrate  25 mg Oral BID  . vitamin B-12  500 mcg Oral Daily   Continuous Infusions:    Dynastee Brummell, DO  [facility] Pager [phone]  If 7PM-7AM, please contact night-coverage [email or web address] Password [email or web address] 02/08/2015, 5:40 PM   LOS: 5 days

## 2015-02-09 LAB — COMPREHENSIVE METABOLIC PANEL
ALBUMIN: 1.7 g/dL — AB (ref 3.5–5.2)
ALK PHOS: 243 U/L — AB (ref 39–117)
ALT: 47 U/L (ref 0–53)
ANION GAP: 14 (ref 5–15)
AST: 39 U/L — ABNORMAL HIGH (ref 0–37)
BILIRUBIN TOTAL: 0.4 mg/dL (ref 0.3–1.2)
BUN: 75 mg/dL — ABNORMAL HIGH (ref 6–23)
CHLORIDE: 98 mmol/L (ref 96–112)
CO2: 20 mmol/L (ref 19–32)
Calcium: 8.3 mg/dL — ABNORMAL LOW (ref 8.4–10.5)
Creatinine, Ser: 3.25 mg/dL — ABNORMAL HIGH (ref 0.50–1.35)
GFR calc Af Amer: 22 mL/min — ABNORMAL LOW (ref >90)
GFR calc non Af Amer: 19 mL/min — ABNORMAL LOW (ref >90)
Glucose, Bld: 245 mg/dL — ABNORMAL HIGH (ref 70–99)
POTASSIUM: 4.1 mmol/L (ref 3.5–5.1)
Sodium: 132 mmol/L — ABNORMAL LOW (ref 135–145)
Total Protein: 6.3 g/dL (ref 6.0–8.3)

## 2015-02-09 LAB — CBC
HEMATOCRIT: 23.5 % — AB (ref 39.0–52.0)
HEMOGLOBIN: 7.5 g/dL — AB (ref 13.0–17.0)
MCH: 29.4 pg (ref 26.0–34.0)
MCHC: 31.9 g/dL (ref 30.0–36.0)
MCV: 92.2 fL (ref 78.0–100.0)
PLATELETS: 249 10*3/uL (ref 150–400)
RBC: 2.55 MIL/uL — ABNORMAL LOW (ref 4.22–5.81)
RDW: 15.1 % (ref 11.5–15.5)
WBC: 12.6 10*3/uL — AB (ref 4.0–10.5)

## 2015-02-09 LAB — GLUCOSE, CAPILLARY
Glucose-Capillary: 213 mg/dL — ABNORMAL HIGH (ref 70–99)
Glucose-Capillary: 175 mg/dL — ABNORMAL HIGH (ref 70–99)

## 2015-02-09 LAB — FOLATE RBC
Folate, Hemolysate: 344.7 ng/mL
Folate, RBC: 1362 ng/mL (ref >498)
HEMATOCRIT: 25.3 % — AB (ref 37.5–51.0)

## 2015-02-09 MED ORDER — PREDNISONE 50 MG PO TABS: 50.0000 mg | ORAL_TABLET | Freq: Every day | ORAL | Status: DC

## 2015-02-09 MED ORDER — ALLOPURINOL 100 MG PO TABS: 200.0000 mg | ORAL_TABLET | Freq: Every day | ORAL | Status: AC

## 2015-02-09 MED ORDER — OXYCODONE-ACETAMINOPHEN 5-325 MG PO TABS: 1.0000 | ORAL_TABLET | ORAL | Status: DC | PRN

## 2015-02-09 MED ORDER — SITAGLIPTIN PHOSPHATE 25 MG PO TABS: 25.0000 mg | ORAL_TABLET | Freq: Every day | ORAL | Status: AC

## 2015-02-09 MED ORDER — FERROUS SULFATE 325 (65 FE) MG PO TABS: 325.0000 mg | ORAL_TABLET | Freq: Two times a day (BID) | ORAL | Status: AC

## 2015-02-09 MED ORDER — CYANOCOBALAMIN 500 MCG PO TABS: 500.0000 ug | ORAL_TABLET | Freq: Every day | ORAL | Status: AC

## 2015-02-09 NOTE — Clinical Social Work Note (Signed)
[facility] Berenice PrimasFriend, Mary, and patient agreeable to this plan- plan transfer via EMS. Reece LevyJanet Loleta Frommelt, MSW, Theresia MajorsLCSWA [phone]

## 2015-02-09 NOTE — Discharge Summary (Signed)
Physician Discharge Summary  [name] Wise ZOX:[id number] DOB: [birth year] DOA: 02/03/2015  PCP: Ralene Ok, MD  Admit date: 02/03/2015 Discharge date: 02/09/2015  Recommendations for Outpatient Follow-up:  1. Pt will need to follow up with PCP in 2 weeks post discharge 2. Please obtain BMP  02/16/15 3. Please obtain serum B12 level in 1 month and adjust B12 supplementation accordingly Discharge Diagnoses:  Acute encephalopathy -Appears to be somewhat better than the day of admission -Multifactorial including anemia, acute on chronic renal failure, pulmonary edema, possible infectious process, and metabolic derangements -B12, folic acid, TSH, RPR, HIV are negative -Ammonia mildly elevated at 42--not clinically significant -Patient's mental status improved throughout the hospitalization and returned to baseline. Fever -Suspect this is likely due to gouty arthritis flare -cultures unrevealing -Blood culture likely contaminant with CoNS -Urinalysis without significant pyuria -Chest x-ray without any infiltrates -Discontinue antibiotics and observe --patient remained afebrile and hemodynamically stable. WBC initially improved off antibiotics Bacteremia  - likely contaminant with CoNS in 1 of 2 sets Discontinue vancomycin al ultrasound negative for hydronephrosis  -Monitor closely with diuresis  Acute on chronic diastolic CHF  -02/04/2015 echo--EF 55-60%, grade 1 diastolic dysfunction  -Daily weights --neg 8 pounds -Check BNP--1559  -No respiratory distress. Presently stable on room air  -pt urinated 4.5L with 2 doses lasix IV Acute on chronic renal failure (CKD4) -baseline creatinine 2.6-3.0 -admission creatinine 4.54 -02/08/15 creatinine 3.55 -Serum creatinine 3.25 on the day of discharge Elevated troponin  -Doubt ACS -Likely demand ischemia in the setting of fever, CKD, and CHF -EKG without concerning ischemic changes -Presently without any chest pain Gouty  arthritis/hyperuricemia -check uric acid--11.8 -Xray right knee--no bony abnormality, small effusion  -start solumedrol  IV daily-->clinically improving-->Patient received 3 days intravenous Solu-Medrol.  -He will be discharged home with prednisone 50 mg daily for 4 additional days  - the patient should start on allopurinol 200 mg daily on 02/16/2015  Diabetes Mellitus -continue novolog sliding scale -pt was not on any meds according to [facility] from SNF -A1C 6.9 -As CBGs are climbing from Solu-Medrol, changed to moderate scale -plan to d/c with [place]  daily Dysphagia -Modified barium swallow shows moderate to severe oropharyngeal dysphagia  -Appreciate speech therapy follow-up  -Started on dysphagia 1 diet  Macrocytic anemia  -01/15/2015 patient had a low B12  -Will supplement  -Check RBC folate--pending at the time of discharge -Patient had negative Hemoccult on 01/16/2015, 02/03/2015 and 02/08/15 -The patient also has a history of iron deficiency. He will continue on ferrous sulfate 325 mg twice a day- Hypertension -continue metoprolol-->change to po -good control -d/c amlodipine Discharge Condition: stable  Disposition: SNF  Diet:dysphagia 1 Wt Readings from Last 3 Encounters:  02/09/15 57.1 kg (125 lb 14.1 oz)  01/21/15 59.421 kg (131 lb)  01/13/15 59.6 kg (131 lb 6.3 oz)    History of present illness:  64 y/o male with history of HTN, paranoid schizophrenia, CKD4, gout, anemia, DM2, 02/03/2015 with altered mental status. Apparently the patient was not acting right when he went to [place]. The patient is a poor historian. Since admission, the patient's hospitalization has become acute by recurrent fever and pulmonary edema. The patient was also found to have acute on chronic renal failure.blood cultures suggest a contaminant. Urinalysis did not show any significant pyuria. The patient's antibiotics were discontinued and he was observed clinically. He remained  hemodynamically stable. 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 He improved clinically and his mental status improved significantly. His WBC  count actually improved. The patient will be sent home on prednisone 50 mg daily for 4 additional days which will complete one week of therapy. On 02/16/2015, the patient should start on allopurinol 200 mg daily. This has been adjusted for his renal function. The patient has hyperuricemia with a serum uric acid of 11.8. Cardiology was consulted for the patient's elevated troponin. They did not feel that the patient had acute coronary syndrome. No further workup was recommended. During the hospitalization. The patient developed pulmonary edema. He was treated with furosemide and had good clinical improvement. He was weaned off to room air without any respiratory difficulties. The patient will not be sent skin on any diuretics. Patient should be weighed daily and consideration should be made to give the patient furosemide if he gains more than 2 pounds on successive days. Echocardiogram showed EF 55-60% with grade 1 diastolic dysfunction.   Discharge Exam: Filed Vitals:   02/09/15 0906  BP: 123/79  Pulse: 79  Temp: 98 F (36.7 C)  Resp: 18   Filed Vitals:   02/08/15 2102 02/08/15 2300 02/09/15 0504 02/09/15 0906  BP: 116/69 112/74 110/74 123/79  Pulse: 75  68 79  Temp: 97.6 F (36.4 C) 97.5 F (36.4 C) 97.6 F (36.4 C) 98 F (36.7 C)  TempSrc: Oral Oral Oral Oral  Resp: 17 18 18 18   Height:      Weight:   57.1 kg (125 lb 14.1 oz)   SpO2: 100% 100% 100% 100%   General: Awake and alert, NAD, pleasant, cooperative Cardiovascular: RRR, no rub, no gallop, no S3 Respiratory: CTAB, no wheeze, no rhonchi Abdomen:soft, nontender, nondistended, positive bowel sounds Extremities: No edema, No lymphangitis, no petechiae  Discharge Instructions      Discharge Instructions    Diet - low sodium heart healthy    Complete  by:  As directed      Discharge instructions    Complete by:  As directed   Start allopurinol 200mg  daily on 02/16/15     Increase activity slowly    Complete by:  As directed             Medication List    STOP taking these medications        amLODipine 10 MG tablet  Commonly known as:  NORVASC     ferrous gluconate 324 MG tablet  Commonly known as:  FERGON      TAKE these medications        allopurinol 100 MG tablet  Commonly known as:  ZYLOPRIM  Take 2 tablets (200 mg total) by mouth daily. Start on 02/16/15  Start taking on:  02/16/2015     aspirin 325 MG EC tablet  Take 1 tablet (325 mg total) by mouth daily.     benztropine 1 MG tablet  Commonly known as:  COGENTIN  Take 1 tablet by mouth daily.     cyanocobalamin 500 MCG tablet  Take 1 tablet (500 mcg total) by mouth daily.     ferrous sulfate 325 (65 FE) MG tablet  Take 1 tablet (325 mg total) by mouth 2 (two) times daily with a meal.     folic acid 1 MG tablet  Commonly known as:  FOLVITE  Take 1 tablet (1 mg total) by mouth daily.     metoprolol tartrate 25 MG tablet  Commonly known as:  LOPRESSOR  Take 1 tablet (25 mg total) by mouth 2 (two) times daily.     oxyCODONE-acetaminophen 5-325 MG  per tablet  Commonly known as:  PERCOCET/ROXICET  Take 1-2 tablets by mouth every 4 (four) hours as needed for moderate pain or severe pain.     predniSONE 50 MG tablet  Commonly known as:  DELTASONE  Take 1 tablet (50 mg total) by mouth daily with breakfast. X 4 days     sitaGLIPtin 25 MG tablet  Commonly known as:  JANUVIA  Take 1 tablet (25 mg total) by mouth daily.     sodium bicarbonate 650 MG tablet  Take 1,300 mg by mouth 2 (two) times daily.         The results of significant diagnostics from this hospitalization (including imaging, microbiology, ancillary and laboratory) are listed below for reference.    Significant Diagnostic Studies: Ct Abdomen Wo Contrast  02-08-15   CLINICAL DATA:   Stage 4 chronic kidney disease.  EXAM: CT ABDOMEN WITHOUT CONTRAST  TECHNIQUE: Multidetector CT imaging of the abdomen was performed following the standard protocol without IV contrast.  COMPARISON:  None.  FINDINGS: Lower chest: Mild cardiomegaly noted. Tiny bilateral pleural effusions versus pleural thickening.  Hepatobiliary: No mass visualized on this non-contrast exam. Gallbladder is unremarkable.  Pancreas: No mass or inflammatory process visualized on this non-contrast exam.  Spleen:  Within normal limits in size.  Adrenal Glands/Kidneys: No adrenal mass identified. Mild bilateral renal parenchymal scarring and atrophy noted. Tiny punctate calcifications could be due to nonobstructing bilateral renal calculi versus vascular calcification. No evidence of hydronephrosis.  Stomach/Bowel/Peritoneum:  Unremarkable.  Vascular/Lymphatic: No pathologically enlarged lymph nodes identified. 3.1 cm infrarenal abdominal aortic aneurysm noted. No evidence of retroperitoneal hemorrhage.  Other:  None.  Musculoskeletal:  No suspicious bone lesions identified.  IMPRESSION: Mild bilateral renal parenchymal scarring and atrophy. No evidence of hydronephrosis.  Tiny nonobstructing bilateral renal calculi versus vascular calcifications.  3.1 cm infrarenal abdominal aortic aneurysm. No evidence of aneurysm leak or rupture. Recommend followup by ultrasound in 3 years. This recommendation follows ACR consensus guidelines: White Paper of the ACR Incidental Findings Committee II on Vascular Findings. [place] Coll Radiol 2013; 10:789-794   Electronically Signed   By: Myles Rosenthal M.D.   On: 08-Feb-2015 16:23   Dg Chest 2 View  02/03/2015   CLINICAL DATA:  Weakness.  Altered mental status.  EXAM: CHEST  2 VIEW  COMPARISON:  Single view of the chest 01/19/2015.  FINDINGS: Lung volumes are low with mild basilar atelectasis. There is cardiomegaly without edema. No pneumothorax or pleural effusion.  IMPRESSION: No acute finding in a low  volume chest.  Cardiomegaly.   Electronically Signed   By: Drusilla Kanner M.D.   On: 02/03/2015 16:37   Dg Chest 2 View  01/13/2015   CLINICAL DATA:  Bilateral lower extremity pain  EXAM: CHEST  2 VIEW  COMPARISON:  None.  FINDINGS: Lungs are clear. Heart is upper normal in size with pulmonary vascularity within normal limits. No adenopathy. No bone lesions.  IMPRESSION: No edema or consolidation.   Electronically Signed   By: Bretta Bang III M.D.   On: 01/13/2015 09:57   Dg Knee 1-2 Views Right  02/06/2015   CLINICAL DATA:  Right knee pain, history of gout  EXAM: RIGHT KNEE - 1-2 VIEW  COMPARISON:  02-08-15  FINDINGS: Small joint effusion is again identified. Some generalized soft tissue swelling about the knee is seen. No acute fracture or dislocation is noted. Mild degenerative changes are seen.  IMPRESSION: The overall appearance is stable from the prior exam. No  acute bony abnormality is seen.   Electronically Signed   By: Alcide Clever M.D.   On: 02/06/2015 08:32   Dg Knee 1-2 Views Right  01/17/2015   CLINICAL DATA:  Right knee pain and swelling for 2-3 days. No known injury. Gout.  EXAM: RIGHT KNEE - 1-2 VIEW  COMPARISON:  None.  FINDINGS: No evidence of acute fracture or dislocation. Diffuse soft tissue swelling is seen most severe in the suprasellar region. Small knee joint effusion cannot be excluded. No evidence joint space narrowing, osteophytosis, or periarticular erosions. Peripheral vascular calcification noted.  IMPRESSION: Diffuse soft tissue swelling. Small knee joint effusion cannot be excluded. No osseous abnormality identified.   Electronically Signed   By: Myles Rosenthal M.D.   On: 01/17/2015 14:03   Ct Head Wo Contrast  02/03/2015   CLINICAL DATA:  Mental status changes.  Schizophrenia.  EXAM: CT HEAD WITHOUT CONTRAST  TECHNIQUE: Contiguous axial images were obtained from the base of the skull through the vertex without intravenous contrast.  COMPARISON:  None.  FINDINGS:  Sinuses/Soft tissues: Hypoplastic frontal sinuses. Other paranasal sinuses and mastoid air cells are clear.  Intracranial: Moderate low density in the periventricular white matter likely related to small vessel disease. Mild cerebral atrophy for age. Ventriculomegaly is mild and felt to be secondary to cerebral atrophy. Dense atherosclerosis in the bilateral vertebral and carotid arteries. No mass lesion, hemorrhage, acute infarct, intra-axial, or extra-axial fluid collection.  IMPRESSION: 1.  No acute intracranial abnormality. 2.  Cerebral atrophy and small vessel ischemic change. 3. Atherosclerosis.   Electronically Signed   By: Jeronimo Greaves M.D.   On: 02/03/2015 16:44   Mr Brain Wo Contrast (neuro Protocol)  02/03/2015   CLINICAL DATA:  Initial evaluation for acute onset confusion, altered mental status, history of schizophrenia.  EXAM: MRI HEAD WITHOUT CONTRAST  TECHNIQUE: Multiplanar, multiecho pulse sequences of the brain and surrounding structures were obtained without intravenous contrast.  COMPARISON:  Prior CT from earlier the same day.  FINDINGS: Study is somewhat degraded by motion artifact.  Diffuse prominence of the CSF containing spaces is compatible with generalized cerebral atrophy. Patchy and confluent T2/FLAIR hyperintensity within the periventricular and deep white matter both cerebral hemispheres noted, nonspecific, but most likely related to chronic small vessel ischemic changes.  No abnormal foci of restricted diffusion to suggest acute intracranial infarct. Tiny mildly intense focus of signal intensity seen at the midline at the anterior genu of corpus callosum favored to be artifactual in nature (series 4, image 33). Gray-white matter differentiation maintained. Normal intravascular flow voids are present. Small remote lacunar infarct present within the right basal ganglia.  No acute or chronic intracranial hemorrhage identified.  No mass lesion or midline shift. Mild ventricular  prominence related global parenchymal volume loss present without hydrocephalus. No extra-axial fluid collection.  Craniocervical junction within normal limits. Pituitary gland is normal. No acute abnormality seen about the orbits.  Paranasal sinuses are grossly clear. There is mild scattered fluid density within the right mastoid air cells.  Bone marrow signal intensity within normal limits. Scalp soft tissues unremarkable.  IMPRESSION: 1. Motion degraded study. No acute intracranial infarct or other process identified. 2. Atrophy with moderate chronic microvascular ischemic disease.   Electronically Signed   By: Rise Mu M.D.   On: 02/03/2015 21:33   Mr Maxine Glenn Abdomen Wo Contrast  01/19/2015   CLINICAL DATA:  Unexplained renal failure. Evaluate for renal artery stenosis. Stage 4 kidney disease with cortical thinning.  EXAM:  MRA ABDOMEN WITHOUT CONTRAST  TECHNIQUE: Angiographic images of the chest were obtained using MRA technique without intravenous contrast.  CONTRAST:  None  COMPARISON:  CT abdomen pelvis - 01/17/2015 ; renal ultrasound - 01/15/2015  FINDINGS: The examination is degraded secondary to lack of intravenous contrast due to patient's renal insufficiency. Examination is further degraded secondary to patient's overlying left upper extremity  Vascular Findings:  Abdominal aorta: The abdominal aorta is suboptimally evaluated on this noncontrast examination. The external caliber of the abdominal aorta is unchanged, measuring approximately 3.0 cm in maximal oblique axial dimension (image 31, series 3).  Celiac artery: There is a minimal amount of eccentric atherosclerotic plaque involving the origin of the celiac artery, not definitely resulting in a hemodynamically significant stenosis. Conventional branching pattern.  SMA: There is a minimal amount of eccentric atherosclerotic plaque involving the origin of the SMA, not definitely resulting in a hemodynamically significant stenosis. The mid  and distal aspects of the main trunk of the SMA are suboptimally evaluated.  Right Renal artery: Suboptimally evaluated. There is a apparent minimal amount of focal atherosclerotic plaque involving the origin of the right renal artery though evaluation for hemodynamic significance is degraded secondary to suboptimal vessel enhancement.  Left Renal artery: There is nondiagnostic evaluation of the left renal artery secondary to suboptimal vessel opacification.  IMA: Not evaluated  Review of the MIP images confirms the above findings.   --------------------------------------------------------------------------------  Nonvascular Findings:  Bilateral renal cortical atrophy, left greater than right, appears grossly unchanged. No urinary obstruction or perinephric stranding.  Normal hepatic contour. Normal noncontrast appearance of the gallbladder. Normal noncontrast appearance of the bilateral adrenal glands, pancreas and spleen. Visualized loops of bowel appear normal. No retroperitoneal or mesenteric adenopathy within the imaged upper abdomen.  IMPRESSION: 1. Markedly degraded examination with nondiagnostic evaluation of the left renal artery. There is eccentric atherosclerotic plaque involving the origin of the right renal artery though evaluation for hemodynamic significance is degraded secondary to suboptimal opacification of the downstream right renal artery. If renal artery stenosis remains of clinical concern, further evaluation could be performed with Renal artery duplex ultrasound which if abnormal could be confirmed with a CO2 angiogram. 2. Grossly unchanged approximately 3.0 cm infrarenal abdominal aortic aneurysm.   Electronically Signed   By: Simonne Come M.D.   On: 01/19/2015 08:09   US Renal  02/04/2015   CLINICAL DATA:  Acute on chronic renal failure, history smoking, hypertension, altered mental status  EXAM: RENAL/URINARY TRACT ULTRASOUND COMPLETE  COMPARISON:  CT abdomen and pelvis 01/17/2015, renal  ultrasound 01/15/2015  FINDINGS: Right Kidney:  Length: 10.0 cm. Cortical thinning. Increased cortical echogenicity. No mass, hydronephrosis or shadowing calcification.  Left Kidney:  Length: 10.1 cm. Cortical thinning. Increased cortical echogenicity. No mass, hydronephrosis or shadowing calcification.  Bladder:  Slightly irregular posterior bladder wall favor trabeculation. Bladder normally distended without definite mass. Prostate gland not visualized.  IMPRESSION: Medical renal disease changes and cortical atrophy of both kidneys.  No gross evidence of renal mass or hydronephrosis.  Question trabeculation of bladder wall, which may be seen with chronic outlet obstruction.   Electronically Signed   By: Ulyses Southward M.D.   On: 02/04/2015 20:15   US Renal  01/15/2015   CLINICAL DATA:  Initial evaluation for renal failure  EXAM: RENAL/URINARY TRACT ULTRASOUND COMPLETE  COMPARISON:  Renal ultrasound 10/03/2003  FINDINGS: Right Kidney:  Length: 10.0 cm. Significant cortical thinning. Increased echogenicity. No hydronephrosis.  Left Kidney:  Length: 9.5 cm. Significant cortical  thinning. Increased echogenicity. No hydronephrosis.  Bladder:  Bladder is decompressed by Foley catheter.  IMPRESSION: Similar to prior study there is significant cortical atrophy with evidence of medical renal disease but there are no acute findings.   Electronically Signed   By: Esperanza Heir M.D.   On: 01/15/2015 15:56   Nm Myocar Multi W/spect W/wall Motion / Ef  01/18/2015   CLINICAL DATA:  elevated troponins, tobacco abuse, HTN, GOUT, schizophrenia, bilateral leg pain.  EXAM: MYOCARDIAL IMAGING WITH SPECT (REST AND PHARMACOLOGIC-STRESS)  GATED LEFT VENTRICULAR WALL MOTION STUDY  LEFT VENTRICULAR EJECTION FRACTION  TECHNIQUE: Standard myocardial SPECT imaging was performed after resting intravenous injection of 10 mCi Tc-8m sestamibi. Subsequently, intravenous infusion of Lexiscan was performed under the supervision of the  Cardiology staff. At peak effect of the drug, 30 mCi Tc-67m sestamibi was injected intravenously and standard myocardial SPECT imaging was performed. Quantitative gated imaging was also performed to evaluate left ventricular wall motion, and estimate left ventricular ejection fraction.  COMPARISON:  None.  FINDINGS: Perfusion: Mildly decreased activity in the anteroseptal region of the left ventricle and lateral wall, on both rest and stress sequences. No decreased activity in the left ventricle on stress imaging to suggest reversible ischemia or infarction. There is a moderate amount of subdiaphragmatic activity noted on both sequences.  Wall Motion: Normal left ventricular wall motion. No left ventricular dilation.  Left Ventricular Ejection Fraction: 60 %  End diastolic volume 18 ml  End systolic volume 45 ml  IMPRESSION: 1. No reversible ischemia. Lateral and anteroseptal attenuation versus scar.  2. Normal left ventricular wall motion.  3. Left ventricular ejection fraction 60%  4. Low-risk stress test findings*.  *2012 Appropriate Use Criteria for Coronary Revascularization Focused Update: J Am Coll Cardiol. 2012;59(9):857-881. http://content.dementiazones.com.aspx?articleid=1201161   Electronically Signed   By: Corlis Leak M.D.   On: 01/18/2015 14:26   Dg Chest Port 1 View  02/06/2015   CLINICAL DATA:  CHF  EXAM: PORTABLE CHEST - 1 VIEW  COMPARISON:  02/05/2015  FINDINGS: Cardiac shadow is stable. The vascular congestion seen on the previous exam has improved slightly in the interval. Better aeration is noted bilaterally. No focal infiltrate is seen.  IMPRESSION: Resolving CHF.   Electronically Signed   By: Alcide Clever M.D.   On: 02/06/2015 08:32   Dg Chest Port 1 View  02/05/2015   CLINICAL DATA:  Fever and chest crackles  EXAM: PORTABLE CHEST - 1 VIEW  COMPARISON:  02/03/2015  FINDINGS: Chronic cardiomegaly. There is stable upper mediastinal widening. There is new diffuse interstitial opacity,  especially prominent at the hila. No effusion or pneumothorax.  IMPRESSION: New pulmonary edema.   Electronically Signed   By: Marnee Spring M.D.   On: 02/05/2015 03:40   Dg Chest Port 1 View  01/19/2015   CLINICAL DATA:  Sudden onset of fever.  EXAM: PORTABLE CHEST - 1 VIEW  COMPARISON:  01/13/2015  FINDINGS: A single AP portable view of the chest demonstrates no focal airspace consolidation or alveolar edema. The lungs are grossly clear. There is no large effusion or pneumothorax. Cardiac and mediastinal contours appear unremarkable.  There is no significant interval change.  IMPRESSION: No acute finding   Electronically Signed   By: Ellery Plunk M.D.   On: 01/19/2015 01:37   Dg Foot 2 Views Left  01/18/2015   CLINICAL DATA:  Diffuse pain. History of gout. No history of trauma  EXAM: LEFT FOOT - 2 VIEW  COMPARISON:  None.  FINDINGS: Frontal and lateral views were obtained. There is no demonstrable fracture or dislocation. There is mild erosive change in the first MTP joint. There is no other erosive change. There is soft tissue swelling dorsally. Other joint spaces appear intact. There is a minimal inferior calcaneal spur.  IMPRESSION: Erosive change first MTP joint. Suspect gout. No fracture or dislocation. Soft tissue swelling dorsally.   Electronically Signed   By: Bretta Bang III M.D.   On: 01/18/2015 17:15   Dg Swallowing Func-speech Pathology  02/04/2015    Objective Swallowing Evaluation:    Patient Details  Name: [name] Wise MRN: [id number] Date of Birth: [birth year]  Today's Date: 02/04/2015 Time: SLP Start Time (ACUTE ONLY): 1335-SLP Stop Time (ACUTE ONLY): 1354 SLP Time Calculation (min) (ACUTE ONLY): 19 min  Past Medical History:  Past Medical History  Diagnosis Date  . Gout   . Hypertension   . Schizophrenia   . Tobacco abuse    Past Surgical History:  Past Surgical History  Procedure Laterality Date  . None     HPI:  HPI: 64 yo male adm to [facility] with AMS- suspected UTI.  Pt with elevated   troponins, CKD stage IV, anemia.  PMH + for schizophrenia.  CT head  negative, MRI negative, CXR negative.  Pt failed RNSSS and SLP evaluation  ordered.    No Data Recorded  Assessment / Plan / Recommendation CHL IP CLINICAL IMPRESSIONS 02/04/2015  Dysphagia Diagnosis Moderate oral phase dysphagia;Severe oral phase  dysphagia;Moderate pharyngeal phase dysphagia  Clinical impression Moderately severe oropharyngeal dysphagia with  sensorimotor deficits.  Pt did not aspirate any consistency tested - trace  laryngeal penetration of thin noted that cleared with further swallows.   Severe delay in oral transiting (up to 12 seconds) noted with pt requiring  verbal cues to swallow -  delay was worse with increased viscocity.   Piecemealing noted which was effective.  Pt pharyngeal swallow  characterized by delay and weakness with resultant residuals without pt  sensation.  CUED dry swallows effective to decrease residuals.    Recommend pt consume full liquid diet initially with strict aspiration  precautions.  Using live video, educated pt to findings, recommendations.     Will follow for readiness for dietary advancement.  SLP set up oral  suction in pt's room to use prn.  Hopeful for pt swallow to improve during  acute stay given his good po tolerance during last admit and negative  MRI/CT.        CHL IP TREATMENT RECOMMENDATION 02/04/2015  Treatment Plan Recommendations Therapy as outlined in treatment plan below      CHL IP DIET RECOMMENDATION 02/04/2015  Diet Recommendations Thin liquid  Liquid Administration via Cup  Medication Administration Crushed with puree  Compensations Slow rate;Small sips/bites  Postural Changes and/or Swallow Maneuvers Seated upright 90  degrees;Upright 30-60 min after meal           CHL IP FREQUENCY AND DURATION 02/04/2015  Speech Therapy Frequency (ACUTE ONLY) min 2x/week  Treatment Duration 2 weeks         CHL IP REASON FOR REFERRAL 02/04/2015  Reason for Referral Objectively evaluate swallowing  function     CHL IP ORAL PHASE 02/04/2015                                      Oral - Nectar Teaspoon Pocketing in anterior sulcus;Holding of  bolus;Delayed oral transit  Oral - Nectar Cup Delayed oral transit;Weak lingual manipulation;Piecemeal  swallowing;Reduced posterior propulsion;Incomplete tongue to palate  contact;Lingual/palatal residue;Holding of bolus           Oral - Thin Teaspoon Delayed oral transit;Weak lingual  manipulation;Piecemeal swallowing;Reduced posterior propulsion;Incomplete  tongue to palate contact;Lingual/palatal residue;Holding of bolus  Oral - Thin Cup Weak lingual manipulation;Delayed oral transit;Piecemeal  swallowing;Reduced posterior propulsion;Incomplete tongue to palate  contact;Lingual/palatal residue;Holding of bolus  Oral - Thin Straw Weak lingual manipulation;Delayed oral transit;Piecemeal  swallowing;Reduced posterior propulsion;Incomplete tongue to palate  contact;Lingual/palatal residue;Holding of bolus  Oral - Thin Syringe (None)  Oral - Puree Weak lingual manipulation;Delayed oral transit;Piecemeal  swallowing;Reduced posterior propulsion;Pocketing in anterior  sulcus;Incomplete tongue to palate contact;Lingual/palatal residue;Holding  of bolus  Oral - Mechanical Soft Weak lingual manipulation;Delayed oral  transit;Piecemeal swallowing;Reduced posterior propulsion;Pocketing in  anterior sulcus;Incomplete tongue to palate contact;Lingual/palatal  residue;Holding of bolus;Impaired mastication        Oral - Pill (None)  Oral Phase - Comment cues to swallow effective albeit pt with continued  delay, pt did not intiate swallow with first bolus of nectar thick barium-  required larger bolus to elicit transit *spillage into pharnx      CHL IP PHARYNGEAL PHASE 02/04/2015  Pharyngeal Phase Impaired                                Pharyngeal - Nectar Teaspoon Delayed swallow initiation;Premature spillage  to pyriform sinuses;Lateral channel residue  Penetration/Aspiration details  (nectar teaspoon) (None)  Pharyngeal - Nectar Cup Delayed swallow initiation;Reduced pharyngeal  peristalsis;Reduced tongue base retraction;Premature spillage to  valleculae;Lateral channel residue                       Pharyngeal - Thin Teaspoon Delayed swallow initiation;Reduced tongue base  retraction;Reduced pharyngeal peristalsis;Lateral channel residue  Penetration/Aspiration details (thin teaspoon) (None)  Pharyngeal - Thin Cup Delayed swallow initiation;Reduced pharyngeal  peristalsis;Reduced tongue base retraction;Lateral channel residue  Penetration/Aspiration details (thin cup) (None)  Pharyngeal - Thin Straw Delayed swallow initiation;Reduced tongue base  retraction;Reduced pharyngeal peristalsis;Lateral channel residue           Pharyngeal - Puree Delayed swallow initiation;Reduced tongue base  retraction;Reduced pharyngeal peristalsis;Pharyngeal residue - valleculae  Penetration/Aspiration details (puree) (None)  Pharyngeal - Mechanical Soft Delayed swallow initiation;Reduced tongue  base retraction;Reduced pharyngeal peristalsis;Pharyngeal residue -  valleculae                       Pharyngeal Comment pt required verbal cues to conduct dry swallows to  faciliate clearance of pharynx - he does not sense residuals which will  increase his aspiration risk     CHL IP CERVICAL ESOPHAGEAL PHASE 02/04/2015  Cervical Esophageal Phase [facility]                                                      Donavan Burnet, MS [facility] SLP [phone]      Microbiology: Recent Results (from the past 240 hour(s))  Culture, Urine     Status: None   Collection Time: 02/03/15  9:00 PM  Result Value Ref Range Status   Specimen Description URINE, RANDOM  Final   Special Requests NONE  Final   Colony  Count NO GROWTH Performed at [facility]   Final   Culture NO GROWTH Performed at [facility]   Final   Report Status 02/05/2015 FINAL  Final  MRSA PCR Screening     Status: None   Collection Time: 02/04/15  12:34 AM  Result Value Ref Range Status   MRSA by PCR NEGATIVE NEGATIVE Final    Comment:        The GeneXpert MRSA Assay (FDA approved for NASAL specimens only), is one component of a comprehensive MRSA colonization surveillance program. It is not intended to diagnose MRSA infection nor to guide or monitor treatment for MRSA infections.   Culture, blood (routine x 2)     Status: None   Collection Time: 02/05/15  5:15 AM  Result Value Ref Range Status   Specimen Description BLOOD RIGHT FOREARM  Final   Special Requests BOTTLES DRAWN AEROBIC ONLY  Final   Culture   Final    STAPHYLOCOCCUS SPECIES (COAGULASE NEGATIVE) Note: THE SIGNIFICANCE OF ISOLATING THIS ORGANISM FROM A SINGLE SET OF BLOOD CULTURES WHEN MULTIPLE SETS ARE DRAWN IS UNCERTAIN. PLEASE NOTIFY THE MICROBIOLOGY DEPARTMENT WITHIN ONE WEEK IF SPECIATION AND SENSITIVITIES ARE REQUIRED. Note: Gram Stain Report Called to,Read Back By and Verified With: LIRA VERDELDIDIOS AT 4:32 A.M. ON 02/06/2015 WARRB Performed at [facility]    Report Status 02/07/2015 FINAL  Final  Culture, blood (routine x 2)     Status: None (Preliminary result)   Collection Time: 02/05/15  5:17 AM  Result Value Ref Range Status   Specimen Description BLOOD RIGHT HAND  Final   Special Requests BOTTLES DRAWN AEROBIC ONLY  Final   Culture   Final           BLOOD CULTURE RECEIVED NO GROWTH TO DATE CULTURE WILL BE HELD FOR 5 DAYS BEFORE ISSUING A FINAL NEGATIVE REPORT Performed at [facility]    Report Status PENDING  Incomplete  Culture, Urine     Status: None   Collection Time: 02/05/15  4:42 PM  Result Value Ref Range Status   Specimen Description URINE, CLEAN CATCH  Final   Special Requests NONE  Final   Colony Count   Final    >=100,000 COLONIES/ML Performed at [facility]    Culture   Final    Multiple bacterial morphotypes present, none predominant. Suggest appropriate recollection if clinically  indicated. Performed at [facility]    Report Status 02/06/2015 FINAL  Final  Clostridium Difficile by PCR     Status: None   Collection Time: 02/08/15  5:07 AM  Result Value Ref Range Status   C difficile by pcr NEGATIVE NEGATIVE Final     Labs: Basic Metabolic Panel:  Recent Labs Lab 02/05/15 0517 02/06/15 0820 02/07/15 0507 02/08/15 0521 02/09/15 0500  NA 137 138 137 132* 132*  K 4.4 4.3 3.9 4.5 4.1  CL 110 110 102 100 98  CO2 17* 18* 20 19 20   GLUCOSE 213* 179* 156* 229* 245*  BUN 52* 50* 55* 65* 75*  CREATININE 3.36* 3.11* 3.37* 3.55* 3.25*  CALCIUM 8.3* 8.7 8.5 8.2* 8.3*   Liver Function Tests:  Recent Labs Lab 02/03/15 1500 02/06/15 0820 02/08/15 0521 02/09/15 0500  AST 24 46* 80* 39*  ALT 20 30 61* 47  ALKPHOS 130* 154* 273* 243*  BILITOT 0.3 0.9 0.6 0.4  PROT 6.8 6.9 6.5 6.3  ALBUMIN 2.1* 1.9* 2.0* 1.7*   No results for  input(s): LIPASE, AMYLASE in the last 168 hours.  Recent Labs Lab 02/03/15 1614  AMMONIA 42*   CBC:  Recent Labs Lab 02/03/15 1500 02/05/15 0517 02/06/15 0820 02/07/15 0507 02/08/15 0521 02/09/15 0535  WBC 10.9* 11.5* 13.0* 16.6* 15.5* 12.6*  NEUTROABS 9.0* 10.2*  --   --   --   --   HGB 7.3* 6.9* 8.3* 7.9* 8.1* 7.5*  HCT 23.8* 22.4* 25.7* 24.5* 25.1* 23.5*  MCV 99.2 98.7 94.8 93.9 93.7 92.2  PLT 254 243 269 257 233 249   Cardiac Enzymes:  Recent Labs Lab 02/03/15 1642  TROPONINI 0.96*   BNP: Invalid input(s): POCBNP CBG:  Recent Labs Lab 02/08/15 0723 02/08/15 1115 02/08/15 1518 02/08/15 2059 02/09/15 0757  GLUCAP 191* 356* 228* 331* 213*    Time coordinating discharge:  Greater than 30 minutes  Signed:  Salaya Holtrop, DO [facility] Pager: [phone] 02/09/2015, 10:35 AM

## 2015-02-09 NOTE — Progress Notes (Signed)
Physical Therapy Treatment Patient Details Name: [name] CorrenteChay Wise MRN: [id number] DOB: 1951/01/18 Today's Date: 02/09/2015    History of Present Illness 64 y.o. male with a history of retention, gout, schizophrenia, renal failure and tobacco abuse, admitted with altered mental status of unclear etiology, generalized weakness and speech changes.      PT Comments    Pt understands and communicated a few English words.  Required increased time to self participate and motivate.  Difficult to get from supine to EOB.  Very weak.  No c/o pain.  Assisted to recliner via stand pivot "Bear Hug" 1/4 turn from elevated bed to recliner total assist pt only 15%.    Follow Up Recommendations  SNF     Equipment Recommendations       Recommendations for Other Services       Precautions / Restrictions Precautions Precautions: Fall Precaution Comments: non English speaking (knows few words) Restrictions Weight Bearing Restrictions: No    Mobility  Bed Mobility Overal bed mobility: Needs Assistance;+2 for physical assistance Bed Mobility: Supine to Sit     Supine to sit: Max assist     General bed mobility comments: increased time and use of bed bad to swival hips around and scoot to EOB.   Transfers Overall transfer level: Needs assistance Equipment used: None Transfers: Stand Pivot Transfers           General transfer comment: performed "bear hug" stand pivot sit 1/4 turn from elevated bed to recliner pt 15%.  Pt was unable to support self in standing and unable to complete poivoted turn.   Ambulation/Gait         Gait velocity: MD reported pt has not amb in over a year.  Transfers only.       Stairs            Wheelchair Mobility    Modified Rankin (Stroke Patients Only)       Balance                                    Cognition Arousal/Alertness: Awake/alert   Overall Cognitive Status: Difficult to assess                       Exercises      General Comments        Pertinent Vitals/Pain Pain Assessment: Faces Faces Pain Scale: Hurts little more Pain Location: R knee and feet, stated pt Pain Intervention(s): Limited activity within patient's tolerance    Home Living                      Prior Function            PT Goals (current goals can now be found in the care plan section) Progress towards PT goals: Progressing toward goals    Frequency  Min 3X/week    PT Plan      Co-evaluation             End of Session Equipment Utilized During Treatment: Gait belt Activity Tolerance: Patient tolerated treatment well Patient left: in chair;with call bell/phone within reach     Time: 1122-1148 PT Time Calculation (min) (ACUTE ONLY): 26 min  Charges:  $Therapeutic Activity: 23-37 mins                    G Codes:  [name] Wise  PTA [facility]  Acute  Rehab Pager      [phone]

## 2015-02-09 NOTE — Care Management Note (Signed)
    Page 1 of 1   02/09/2015     12:07:26 PM CARE MANAGEMENT NOTE 02/09/2015  Patient:  [name] Wise,[name] Wise   Account Number:  [id number]  Date Initiated:  02/09/2015  Documentation initiated by:  Lorenda IshiharaPEELE,Bobbi Kozakiewicz  Subjective/Objective Assessment:   64 yo male admitted with AMS. [facility]     Action/Plan:   Return to SNF   Anticipated DC Date:  02/09/2015   Anticipated DC Plan:  SKILLED NURSING FACILITY  In-house referral  Clinical Social Worker      DC Planning Services  CM consult      Choice offered to / List presented to:             Status of service:  Completed, signed off Medicare Important Message given?  YES (If response is "NO", the following Medicare IM given date fields will be blank) Date Medicare IM given:  02/09/2015 Medicare IM [facility]:  [facility]Valia Wingard Date Additional Medicare IM given:   Additional Medicare IM given by:    Discharge Disposition:  SKILLED NURSING FACILITY  Per UR Regulation:  Reviewed for med. necessity/level of care/duration of stay  If discussed at Long Length of Stay Meetings, dates discussed:    Comments:  02-09-15 Lorenda IshiharaSuzanne Anays Detore RN CM IM left in room, called Rande LawmanMary Wakeman@ [phone], left VM regarding IM.

## 2015-02-09 NOTE — Progress Notes (Signed)
[facility] Pt will be [facility] via EMS in stable condition.

## 2015-02-11 ENCOUNTER — Other Ambulatory Visit: Payer: Self-pay | Admitting: *Deleted

## 2015-02-11 ENCOUNTER — Other Ambulatory Visit: Payer: Self-pay

## 2015-02-11 LAB — CULTURE, BLOOD (ROUTINE X 2): CULTURE: NO GROWTH

## 2015-02-11 MED ORDER — OXYCODONE-ACETAMINOPHEN 5-325 MG PO TABS: 1.0000 | ORAL_TABLET | ORAL | Status: DC | PRN

## 2015-02-11 NOTE — Telephone Encounter (Signed)
RX faxed to [facility] @ [phone], phone number [phone] 

## 2015-02-12 ENCOUNTER — Non-Acute Institutional Stay (SKILLED_NURSING_FACILITY): Payer: Medicare Other | Admitting: Internal Medicine

## 2015-02-12 DIAGNOSIS — I5032 Chronic diastolic (congestive) heart failure: Secondary | ICD-10-CM | POA: Diagnosis not present

## 2015-02-12 DIAGNOSIS — Z720 Tobacco use: Secondary | ICD-10-CM

## 2015-02-12 DIAGNOSIS — M109 Gout, unspecified: Secondary | ICD-10-CM

## 2015-02-12 DIAGNOSIS — N184 Chronic kidney disease, stage 4 (severe): Secondary | ICD-10-CM

## 2015-02-12 DIAGNOSIS — D539 Nutritional anemia, unspecified: Secondary | ICD-10-CM

## 2015-02-12 DIAGNOSIS — I10 Essential (primary) hypertension: Secondary | ICD-10-CM | POA: Diagnosis not present

## 2015-02-12 DIAGNOSIS — F200 Paranoid schizophrenia: Secondary | ICD-10-CM | POA: Diagnosis not present

## 2015-02-12 NOTE — Progress Notes (Signed)
Patient ID: [name] Wise, male   DOB: [birth year], 64 y.o.   MRN: [id number]    HISTORY AND PHYSICAL  Location:  [facility]    Place of Service: SNF [phone]   Extended Emergency Contact Information Primary Emergency Contact: [name] Wise,[name] Wise  [place] Home Phone: [phone] Relation: Friend Secondary Emergency Contact: [name] Wise,[name] Wise  [place] Home Phone: [phone] Relation: Daughter  Advanced Directive information  DNR; MOST form on chart  Chief Complaint  Patient presents with  . Readmit To SNF    acute encephalopathy, gouty arthritis flare, bacteremia with fever, acute/chronic heart failure, acute/chronic kideney failure (stg 4), elevated Troponin, DM II, (A1c 6.9%), dysphagia, macrocytic anemia with B12 deficiency, HTN, schizophrenia    HPI:  64 yo male seen today as a readmission into SNF following hospital stay for gouty arthritis flare, renal failure, heart failure, bacteremia. He c/o pain in hands not controlled with current pain regimen. No recent falls. He tales allopurinol for gout maintenance.   He has schizophrenia and receives haldol infusions q6weeks.  BP stable on metoprolol. He has heart failure. Takes ASA daily  CBGs elevated on prednisone tx. Today CBG 330. He takes [place] and SSI  He is taking sodium bicarbonate for CKD  For anemia, he takes B12 and folate.   Past Medical History  Diagnosis Date  . Gout   . Hypertension   . Schizophrenia   . Tobacco abuse     Past Surgical History  Procedure Laterality Date  . None      Patient Care Team: [name] Okoy Moreira, MD as PCP - General (Internal Medicine)  History   Social History  . Marital Status: Divorced    Spouse Name: N/A  . Number of Children: N/A  . Years of Education: N/A   Occupational History  . Not on file.   Social History Main Topics  . Smoking status: Current Every Day Smoker    Types: Cigarettes  . Smokeless tobacco: Not on file    . Alcohol Use: No  . Drug Use: No  . Sexual Activity: Yes   Other Topics Concern  . Not on file   Social History Narrative     reports that he has been smoking Cigarettes.  He does not have any smokeless tobacco history on file. He reports that he does not drink alcohol or use illicit drugs.  Family History  Problem Relation Age of Onset  . Hypertension Mother   . Gout Father    No family status information on file.    Immunization History  Administered Date(s) Administered  . Pneumococcal Polysaccharide-23 01/15/2015    No Known Allergies  Medications: Patient's Medications  New Prescriptions   No medications on file  Previous Medications   ALLOPURINOL (ZYLOPRIM) 100 MG TABLET    Take 2 tablets (200 mg total) by mouth daily. Start on 02/16/15   ASPIRIN EC 325 MG EC TABLET    Take 1 tablet (325 mg total) by mouth daily.   BENZTROPINE (COGENTIN) 1 MG TABLET    Take 1 tablet by mouth daily.   FERROUS SULFATE 325 (65 FE) MG TABLET    Take 1 tablet (325 mg total) by mouth 2 (two) times daily with a meal.   FOLIC ACID (FOLVITE) 1 MG TABLET    Take 1 tablet (1 mg total) by mouth daily.   METOPROLOL TARTRATE (LOPRESSOR) 25 MG TABLET    Take 1 tablet (25 mg total) by mouth 2 (two) times daily.  OXYCODONE-ACETAMINOPHEN (PERCOCET/ROXICET) 5-325 MG PER TABLET    Take 1-2 tablets by mouth every 4 (four) hours as needed for moderate pain or severe pain.   PREDNISONE (DELTASONE) 50 MG TABLET    Take 1 tablet (50 mg total) by mouth daily with breakfast. X 4 days   SITAGLIPTIN (JANUVIA) 25 MG TABLET    Take 1 tablet (25 mg total) by mouth daily.   SODIUM BICARBONATE 650 MG TABLET    Take 1,300 mg by mouth 2 (two) times daily.   VITAMIN B-12 500 MCG TABLET    Take 1 tablet (500 mcg total) by mouth daily.  Modified Medications   No medications on file  Discontinued Medications   No medications on file    Review of Systems  Constitutional: Positive for fatigue. Negative for fever,  chills and activity change.  HENT: Positive for trouble swallowing. Negative for sore throat.   Eyes: Negative for visual disturbance.  Respiratory: Negative for cough, chest tightness and shortness of breath.   Cardiovascular: Negative for chest pain, palpitations and leg swelling.  Gastrointestinal: Negative for nausea, vomiting, abdominal pain and blood in stool.  Genitourinary: Negative for urgency, frequency and difficulty urinating.  Musculoskeletal: Positive for joint swelling, arthralgias and gait problem.  Skin: Negative for rash.  Neurological: Positive for speech difficulty (expressive aphasia). Negative for weakness and headaches.  Psychiatric/Behavioral: Positive for agitation (due to pain). Negative for confusion and sleep disturbance. The patient is not nervous/anxious.     BP 141/88, HR 80, Temp 97.61F, pulse ox 98%  There is no weight on file to calculate BMI.  Physical Exam  Constitutional: He is oriented to person, place, and time.  Frail appearing in NAD. Lying in bed  HENT:  Mouth/Throat: Oropharynx is clear and moist.  Eyes: Pupils are equal, round, and reactive to light. No scleral icterus.  Neck: Neck supple.  Cardiovascular: Normal rate, regular rhythm, normal heart sounds and intact distal pulses.  Exam reveals no gallop and no friction rub.   No murmur heard. No carotid bruit b/l; Trace distal LE swelling  Pulmonary/Chest: Effort normal and breath sounds normal. He has no wheezes. He has no rales. He exhibits no tenderness.  Abdominal: Soft. Bowel sounds are normal. He exhibits no distension, no abdominal bruit, no pulsatile midline mass and no mass. There is no tenderness. There is no rebound and no guarding.  Musculoskeletal: He exhibits edema and tenderness.  Tip of right index finger show tophi protruding  Lymphadenopathy:    He has no cervical adenopathy.  Neurological: He is alert and oriented to person, place, and time. He displays no seizure activity.   Right sided weakness  Skin: Skin is warm and dry. No rash noted.  Psychiatric: Judgment and thought content normal. His mood appears anxious. His speech is slurred. He is agitated.     Labs reviewed: Admission on 02/03/2015, Discharged on 02/09/2015  No results displayed because visit has over 200 results.    Admission on 01/13/2015, Discharged on 01/20/2015  No results displayed because visit has over 200 results.     CBC Latest Ref Rng 02/09/2015 02/08/2015 02/07/2015  WBC 4.0 - 10.5 K/uL 12.6(H) 15.5(H) 16.6(H)  Hemoglobin 13.0 - 17.0 g/dL 7.5(L) 8.1(L) 7.9(L)  Hematocrit 39.0 - 52.0 % 23.5(L) 25.1(L) 24.5(L)  Platelets 150 - 400 K/uL 249 233 257   CMP Latest Ref Rng 02/09/2015 02/08/2015 02/07/2015  Glucose 70 - 99 mg/dL 409(W) 119(J) 478(G)  BUN 6 - 23 mg/dL 95(A) 21(H) 08(M)  Creatinine 0.50 - 1.35 mg/dL 1.61(W) 9.60(A) 5.40(J)  Sodium 135 - 145 mmol/L 132(L) 132(L) 137  Potassium 3.5 - 5.1 mmol/L 4.1 4.5 3.9  Chloride 96 - 112 mmol/L 98 100 102  CO2 19 - 32 mmol/L 20 19 20   Calcium 8.4 - 10.5 mg/dL 8.3(L) 8.2(L) 8.5  Total Protein 6.0 - 8.3 g/dL 6.3 6.5 -  Total Bilirubin 0.3 - 1.2 mg/dL 0.4 0.6 -  Alkaline Phos 39 - 117 U/L 243(H) 273(H) -  AST 0 - 37 U/L 39(H) 80(H) -  ALT 0 - 53 U/L 47 61(H) -      Ct Abdomen Wo Contrast  01/17/2015   CLINICAL DATA:  Stage 4 chronic kidney disease.  EXAM: CT ABDOMEN WITHOUT CONTRAST  TECHNIQUE: Multidetector CT imaging of the abdomen was performed following the standard protocol without IV contrast.  COMPARISON:  None.  FINDINGS: Lower chest: Mild cardiomegaly noted. Tiny bilateral pleural effusions versus pleural thickening.  Hepatobiliary: No mass visualized on this non-contrast exam. Gallbladder is unremarkable.  Pancreas: No mass or inflammatory process visualized on this non-contrast exam.  Spleen:  Within normal limits in size.  Adrenal Glands/Kidneys: No adrenal mass identified. Mild bilateral renal parenchymal scarring and atrophy  noted. Tiny punctate calcifications could be due to nonobstructing bilateral renal calculi versus vascular calcification. No evidence of hydronephrosis.  Stomach/Bowel/Peritoneum:  Unremarkable.  Vascular/Lymphatic: No pathologically enlarged lymph nodes identified. 3.1 cm infrarenal abdominal aortic aneurysm noted. No evidence of retroperitoneal hemorrhage.  Other:  None.  Musculoskeletal:  No suspicious bone lesions identified.  IMPRESSION: Mild bilateral renal parenchymal scarring and atrophy. No evidence of hydronephrosis.  Tiny nonobstructing bilateral renal calculi versus vascular calcifications.  3.1 cm infrarenal abdominal aortic aneurysm. No evidence of aneurysm leak or rupture. Recommend followup by ultrasound in 3 years. This recommendation follows ACR consensus guidelines: White Paper of the ACR Incidental Findings Committee II on Vascular Findings. [place] Coll Radiol 2013; 10:789-794   Electronically Signed   By: Myles Rosenthal M.D.   On: 01/17/2015 16:23   Dg Chest 2 View  02/03/2015   CLINICAL DATA:  Weakness.  Altered mental status.  EXAM: CHEST  2 VIEW  COMPARISON:  Single view of the chest 01/19/2015.  FINDINGS: Lung volumes are low with mild basilar atelectasis. There is cardiomegaly without edema. No pneumothorax or pleural effusion.  IMPRESSION: No acute finding in a low volume chest.  Cardiomegaly.   Electronically Signed   By: Drusilla Kanner M.D.   On: 02/03/2015 16:37   Dg Knee 1-2 Views Right  02/06/2015   CLINICAL DATA:  Right knee pain, history of gout  EXAM: RIGHT KNEE - 1-2 VIEW  COMPARISON:  01/17/2015  FINDINGS: Small joint effusion is again identified. Some generalized soft tissue swelling about the knee is seen. No acute fracture or dislocation is noted. Mild degenerative changes are seen.  IMPRESSION: The overall appearance is stable from the prior exam. No acute bony abnormality is seen.   Electronically Signed   By: Alcide Clever M.D.   On: 02/06/2015 08:32   Dg Knee 1-2 Views  Right  01/17/2015   CLINICAL DATA:  Right knee pain and swelling for 2-3 days. No known injury. Gout.  EXAM: RIGHT KNEE - 1-2 VIEW  COMPARISON:  None.  FINDINGS: No evidence of acute fracture or dislocation. Diffuse soft tissue swelling is seen most severe in the suprasellar region. Small knee joint effusion cannot be excluded. No evidence joint space narrowing, osteophytosis, or periarticular erosions. Peripheral vascular  calcification noted.  IMPRESSION: Diffuse soft tissue swelling. Small knee joint effusion cannot be excluded. No osseous abnormality identified.   Electronically Signed   By: Myles Rosenthal M.D.   On: 01/17/2015 14:03   Ct Head Wo Contrast  02/03/2015   CLINICAL DATA:  Mental status changes.  Schizophrenia.  EXAM: CT HEAD WITHOUT CONTRAST  TECHNIQUE: Contiguous axial images were obtained from the base of the skull through the vertex without intravenous contrast.  COMPARISON:  None.  FINDINGS: Sinuses/Soft tissues: Hypoplastic frontal sinuses. Other paranasal sinuses and mastoid air cells are clear.  Intracranial: Moderate low density in the periventricular white matter likely related to small vessel disease. Mild cerebral atrophy for age. Ventriculomegaly is mild and felt to be secondary to cerebral atrophy. Dense atherosclerosis in the bilateral vertebral and carotid arteries. No mass lesion, hemorrhage, acute infarct, intra-axial, or extra-axial fluid collection.  IMPRESSION: 1.  No acute intracranial abnormality. 2.  Cerebral atrophy and small vessel ischemic change. 3. Atherosclerosis.   Electronically Signed   By: Jeronimo Greaves M.D.   On: 02/03/2015 16:44   Mr Brain Wo Contrast (neuro Protocol)  02/03/2015   CLINICAL DATA:  Initial evaluation for acute onset confusion, altered mental status, history of schizophrenia.  EXAM: MRI HEAD WITHOUT CONTRAST  TECHNIQUE: Multiplanar, multiecho pulse sequences of the brain and surrounding structures were obtained without intravenous contrast.   COMPARISON:  Prior CT from earlier the same day.  FINDINGS: Study is somewhat degraded by motion artifact.  Diffuse prominence of the CSF containing spaces is compatible with generalized cerebral atrophy. Patchy and confluent T2/FLAIR hyperintensity within the periventricular and deep white matter both cerebral hemispheres noted, nonspecific, but most likely related to chronic small vessel ischemic changes.  No abnormal foci of restricted diffusion to suggest acute intracranial infarct. Tiny mildly intense focus of signal intensity seen at the midline at the anterior genu of corpus callosum favored to be artifactual in nature (series 4, image 33). Gray-white matter differentiation maintained. Normal intravascular flow voids are present. Small remote lacunar infarct present within the right basal ganglia.  No acute or chronic intracranial hemorrhage identified.  No mass lesion or midline shift. Mild ventricular prominence related global parenchymal volume loss present without hydrocephalus. No extra-axial fluid collection.  Craniocervical junction within normal limits. Pituitary gland is normal. No acute abnormality seen about the orbits.  Paranasal sinuses are grossly clear. There is mild scattered fluid density within the right mastoid air cells.  Bone marrow signal intensity within normal limits. Scalp soft tissues unremarkable.  IMPRESSION: 1. Motion degraded study. No acute intracranial infarct or other process identified. 2. Atrophy with moderate chronic microvascular ischemic disease.   Electronically Signed   By: Rise Mu M.D.   On: 02/03/2015 21:33   Mr Maxine Glenn Abdomen Wo Contrast  01/19/2015   CLINICAL DATA:  Unexplained renal failure. Evaluate for renal artery stenosis. Stage 4 kidney disease with cortical thinning.  EXAM: MRA ABDOMEN WITHOUT CONTRAST  TECHNIQUE: Angiographic images of the chest were obtained using MRA technique without intravenous contrast.  CONTRAST:  None  COMPARISON:  CT  abdomen pelvis - 01/17/2015 ; renal ultrasound - 01/15/2015  FINDINGS: The examination is degraded secondary to lack of intravenous contrast due to patient's renal insufficiency. Examination is further degraded secondary to patient's overlying left upper extremity  Vascular Findings:  Abdominal aorta: The abdominal aorta is suboptimally evaluated on this noncontrast examination. The external caliber of the abdominal aorta is unchanged, measuring approximately 3.0 cm in maximal oblique axial  dimension (image 31, series 3).  Celiac artery: There is a minimal amount of eccentric atherosclerotic plaque involving the origin of the celiac artery, not definitely resulting in a hemodynamically significant stenosis. Conventional branching pattern.  SMA: There is a minimal amount of eccentric atherosclerotic plaque involving the origin of the SMA, not definitely resulting in a hemodynamically significant stenosis. The mid and distal aspects of the main trunk of the SMA are suboptimally evaluated.  Right Renal artery: Suboptimally evaluated. There is a apparent minimal amount of focal atherosclerotic plaque involving the origin of the right renal artery though evaluation for hemodynamic significance is degraded secondary to suboptimal vessel enhancement.  Left Renal artery: There is nondiagnostic evaluation of the left renal artery secondary to suboptimal vessel opacification.  IMA: Not evaluated  Review of the MIP images confirms the above findings.   --------------------------------------------------------------------------------  Nonvascular Findings:  Bilateral renal cortical atrophy, left greater than right, appears grossly unchanged. No urinary obstruction or perinephric stranding.  Normal hepatic contour. Normal noncontrast appearance of the gallbladder. Normal noncontrast appearance of the bilateral adrenal glands, pancreas and spleen. Visualized loops of bowel appear normal. No retroperitoneal or mesenteric adenopathy  within the imaged upper abdomen.  IMPRESSION: 1. Markedly degraded examination with nondiagnostic evaluation of the left renal artery. There is eccentric atherosclerotic plaque involving the origin of the right renal artery though evaluation for hemodynamic significance is degraded secondary to suboptimal opacification of the downstream right renal artery. If renal artery stenosis remains of clinical concern, further evaluation could be performed with Renal artery duplex ultrasound which if abnormal could be confirmed with a CO2 angiogram. 2. Grossly unchanged approximately 3.0 cm infrarenal abdominal aortic aneurysm.   Electronically Signed   By: Simonne Come M.D.   On: 01/19/2015 08:09   US Renal  02/04/2015   CLINICAL DATA:  Acute on chronic renal failure, history smoking, hypertension, altered mental status  EXAM: RENAL/URINARY TRACT ULTRASOUND COMPLETE  COMPARISON:  CT abdomen and pelvis 01/17/2015, renal ultrasound 01/15/2015  FINDINGS: Right Kidney:  Length: 10.0 cm. Cortical thinning. Increased cortical echogenicity. No mass, hydronephrosis or shadowing calcification.  Left Kidney:  Length: 10.1 cm. Cortical thinning. Increased cortical echogenicity. No mass, hydronephrosis or shadowing calcification.  Bladder:  Slightly irregular posterior bladder wall favor trabeculation. Bladder normally distended without definite mass. Prostate gland not visualized.  IMPRESSION: Medical renal disease changes and cortical atrophy of both kidneys.  No gross evidence of renal mass or hydronephrosis.  Question trabeculation of bladder wall, which may be seen with chronic outlet obstruction.   Electronically Signed   By: Ulyses Southward M.D.   On: 02/04/2015 20:15   US Renal  01/15/2015   CLINICAL DATA:  Initial evaluation for renal failure  EXAM: RENAL/URINARY TRACT ULTRASOUND COMPLETE  COMPARISON:  Renal ultrasound 10/03/2003  FINDINGS: Right Kidney:  Length: 10.0 cm. Significant cortical thinning. Increased echogenicity.  No hydronephrosis.  Left Kidney:  Length: 9.5 cm. Significant cortical thinning. Increased echogenicity. No hydronephrosis.  Bladder:  Bladder is decompressed by Foley catheter.  IMPRESSION: Similar to prior study there is significant cortical atrophy with evidence of medical renal disease but there are no acute findings.   Electronically Signed   By: Esperanza Heir M.D.   On: 01/15/2015 15:56   Nm Myocar Multi W/spect W/wall Motion / Ef  01/18/2015   CLINICAL DATA:  elevated troponins, tobacco abuse, HTN, GOUT, schizophrenia, bilateral leg pain.  EXAM: MYOCARDIAL IMAGING WITH SPECT (REST AND PHARMACOLOGIC-STRESS)  GATED LEFT VENTRICULAR WALL MOTION STUDY  LEFT VENTRICULAR EJECTION FRACTION  TECHNIQUE: Standard myocardial SPECT imaging was performed after resting intravenous injection of 10 mCi Tc-18m sestamibi. Subsequently, intravenous infusion of Lexiscan was performed under the supervision of the Cardiology staff. At peak effect of the drug, 30 mCi Tc-35m sestamibi was injected intravenously and standard myocardial SPECT imaging was performed. Quantitative gated imaging was also performed to evaluate left ventricular wall motion, and estimate left ventricular ejection fraction.  COMPARISON:  None.  FINDINGS: Perfusion: Mildly decreased activity in the anteroseptal region of the left ventricle and lateral wall, on both rest and stress sequences. No decreased activity in the left ventricle on stress imaging to suggest reversible ischemia or infarction. There is a moderate amount of subdiaphragmatic activity noted on both sequences.  Wall Motion: Normal left ventricular wall motion. No left ventricular dilation.  Left Ventricular Ejection Fraction: 60 %  End diastolic volume 18 ml  End systolic volume 45 ml  IMPRESSION: 1. No reversible ischemia. Lateral and anteroseptal attenuation versus scar.  2. Normal left ventricular wall motion.  3. Left ventricular ejection fraction 60%  4. Low-risk stress test  findings*.  *2012 Appropriate Use Criteria for Coronary Revascularization Focused Update: J Am Coll Cardiol. 2012;59(9):857-881. http://content.dementiazones.com.aspx?articleid=1201161   Electronically Signed   By: Corlis Leak M.D.   On: 01/18/2015 14:26   Dg Chest Port 1 View  02/06/2015   CLINICAL DATA:  CHF  EXAM: PORTABLE CHEST - 1 VIEW  COMPARISON:  02/05/2015  FINDINGS: Cardiac shadow is stable. The vascular congestion seen on the previous exam has improved slightly in the interval. Better aeration is noted bilaterally. No focal infiltrate is seen.  IMPRESSION: Resolving CHF.   Electronically Signed   By: Alcide Clever M.D.   On: 02/06/2015 08:32   Dg Chest Port 1 View  02/05/2015   CLINICAL DATA:  Fever and chest crackles  EXAM: PORTABLE CHEST - 1 VIEW  COMPARISON:  02/03/2015  FINDINGS: Chronic cardiomegaly. There is stable upper mediastinal widening. There is new diffuse interstitial opacity, especially prominent at the hila. No effusion or pneumothorax.  IMPRESSION: New pulmonary edema.   Electronically Signed   By: Marnee Spring M.D.   On: 02/05/2015 03:40   Dg Chest Port 1 View  01/19/2015   CLINICAL DATA:  Sudden onset of fever.  EXAM: PORTABLE CHEST - 1 VIEW  COMPARISON:  01/13/2015  FINDINGS: A single AP portable view of the chest demonstrates no focal airspace consolidation or alveolar edema. The lungs are grossly clear. There is no large effusion or pneumothorax. Cardiac and mediastinal contours appear unremarkable.  There is no significant interval change.  IMPRESSION: No acute finding   Electronically Signed   By: Ellery Plunk M.D.   On: 01/19/2015 01:37   Dg Foot 2 Views Left  01/18/2015   CLINICAL DATA:  Diffuse pain. History of gout. No history of trauma  EXAM: LEFT FOOT - 2 VIEW  COMPARISON:  None.  FINDINGS: Frontal and lateral views were obtained. There is no demonstrable fracture or dislocation. There is mild erosive change in the first MTP joint. There is no other  erosive change. There is soft tissue swelling dorsally. Other joint spaces appear intact. There is a minimal inferior calcaneal spur.  IMPRESSION: Erosive change first MTP joint. Suspect gout. No fracture or dislocation. Soft tissue swelling dorsally.   Electronically Signed   By: Bretta Bang III M.D.   On: 01/18/2015 17:15   Dg Swallowing Func-speech Pathology  02/04/2015    Objective Swallowing Evaluation:  Patient Details  Name: Ilian Wessell MRN: [id number] Date of Birth: 09-10-1951  Today's Date: 02/04/2015 Time: SLP Start Time (ACUTE ONLY): 1335-SLP Stop Time (ACUTE ONLY): 1354 SLP Time Calculation (min) (ACUTE ONLY): 19 min  Past Medical History:  Past Medical History  Diagnosis Date  . Gout   . Hypertension   . Schizophrenia   . Tobacco abuse    Past Surgical History:  Past Surgical History  Procedure Laterality Date  . None     HPI:  HPI: 64 yo male adm to [facility] with AMS- suspected UTI.  Pt with elevated  troponins, CKD stage IV, anemia.  PMH + for schizophrenia.  CT head  negative, MRI negative, CXR negative.  Pt failed RNSSS and SLP evaluation  ordered.    No Data Recorded  Assessment / Plan / Recommendation CHL IP CLINICAL IMPRESSIONS 02/04/2015  Dysphagia Diagnosis Moderate oral phase dysphagia;Severe oral phase  dysphagia;Moderate pharyngeal phase dysphagia  Clinical impression Moderately severe oropharyngeal dysphagia with  sensorimotor deficits.  Pt did not aspirate any consistency tested - trace  laryngeal penetration of thin noted that cleared with further swallows.   Severe delay in oral transiting (up to 12 seconds) noted with pt requiring  verbal cues to swallow -  delay was worse with increased viscocity.   Piecemealing noted which was effective.  Pt pharyngeal swallow  characterized by delay and weakness with resultant residuals without pt  sensation.  CUED dry swallows effective to decrease residuals.    Recommend pt consume full liquid diet initially with strict aspiration  precautions.  Using  live video, educated pt to findings, recommendations.     Will follow for readiness for dietary advancement.  SLP set up oral  suction in pt's room to use prn.  Hopeful for pt swallow to improve during  acute stay given his good po tolerance during last admit and negative  MRI/CT.        CHL IP TREATMENT RECOMMENDATION 02/04/2015  Treatment Plan Recommendations Therapy as outlined in treatment plan below      CHL IP DIET RECOMMENDATION 02/04/2015  Diet Recommendations Thin liquid  Liquid Administration via Cup  Medication Administration Crushed with puree  Compensations Slow rate;Small sips/bites  Postural Changes and/or Swallow Maneuvers Seated upright 90  degrees;Upright 30-60 min after meal           CHL IP FREQUENCY AND DURATION 02/04/2015  Speech Therapy Frequency (ACUTE ONLY) min 2x/week  Treatment Duration 2 weeks         CHL IP REASON FOR REFERRAL 02/04/2015  Reason for Referral Objectively evaluate swallowing function     CHL IP ORAL PHASE 02/04/2015                                      Oral - Nectar Teaspoon Pocketing in anterior sulcus;Holding of  bolus;Delayed oral transit  Oral - Nectar Cup Delayed oral transit;Weak lingual manipulation;Piecemeal  swallowing;Reduced posterior propulsion;Incomplete tongue to palate  contact;Lingual/palatal residue;Holding of bolus           Oral - Thin Teaspoon Delayed oral transit;Weak lingual  manipulation;Piecemeal swallowing;Reduced posterior propulsion;Incomplete  tongue to palate contact;Lingual/palatal residue;Holding of bolus  Oral - Thin Cup Weak lingual manipulation;Delayed oral transit;Piecemeal  swallowing;Reduced posterior propulsion;Incomplete tongue to palate  contact;Lingual/palatal residue;Holding of bolus  Oral - Thin Straw Weak lingual manipulation;Delayed oral transit;Piecemeal  swallowing;Reduced posterior propulsion;Incomplete tongue to palate  contact;Lingual/palatal residue;Holding of  bolus  Oral - Thin Syringe (None)  Oral - Puree Weak lingual  manipulation;Delayed oral transit;Piecemeal  swallowing;Reduced posterior propulsion;Pocketing in anterior  sulcus;Incomplete tongue to palate contact;Lingual/palatal residue;Holding  of bolus  Oral - Mechanical Soft Weak lingual manipulation;Delayed oral  transit;Piecemeal swallowing;Reduced posterior propulsion;Pocketing in  anterior sulcus;Incomplete tongue to palate contact;Lingual/palatal  residue;Holding of bolus;Impaired mastication        Oral - Pill (None)  Oral Phase - Comment cues to swallow effective albeit pt with continued  delay, pt did not intiate swallow with first bolus of nectar thick barium-  required larger bolus to elicit transit *spillage into pharnx      CHL IP PHARYNGEAL PHASE 02/04/2015  Pharyngeal Phase Impaired                                Pharyngeal - Nectar Teaspoon Delayed swallow initiation;Premature spillage  to pyriform sinuses;Lateral channel residue  Penetration/Aspiration details (nectar teaspoon) (None)  Pharyngeal - Nectar Cup Delayed swallow initiation;Reduced pharyngeal  peristalsis;Reduced tongue base retraction;Premature spillage to  valleculae;Lateral channel residue                       Pharyngeal - Thin Teaspoon Delayed swallow initiation;Reduced tongue base  retraction;Reduced pharyngeal peristalsis;Lateral channel residue  Penetration/Aspiration details (thin teaspoon) (None)  Pharyngeal - Thin Cup Delayed swallow initiation;Reduced pharyngeal  peristalsis;Reduced tongue base retraction;Lateral channel residue  Penetration/Aspiration details (thin cup) (None)  Pharyngeal - Thin Straw Delayed swallow initiation;Reduced tongue base  retraction;Reduced pharyngeal peristalsis;Lateral channel residue           Pharyngeal - Puree Delayed swallow initiation;Reduced tongue base  retraction;Reduced pharyngeal peristalsis;Pharyngeal residue - valleculae  Penetration/Aspiration details (puree) (None)  Pharyngeal - Mechanical Soft Delayed swallow initiation;Reduced tongue  base  retraction;Reduced pharyngeal peristalsis;Pharyngeal residue -  valleculae                       Pharyngeal Comment pt required verbal cues to conduct dry swallows to  faciliate clearance of pharynx - he does not sense residuals which will  increase his aspiration risk     CHL IP CERVICAL ESOPHAGEAL PHASE 02/04/2015  Cervical Esophageal Phase [facility]                                                      Donavan Burnet, MS [facility] [phone]    Hospital records reviewed as above - last A1c 6.9%. He was placed on prednisone taper for generalized gout. He was seen by nephrology due to acute/CKD.  Assessment/Plan   ICD-9-CM ICD-10-CM   1. Acute gout, unspecified cause, unspecified site - uncontrolled with severe pain and tophi in fingers - finish prednisone tx; cont allopurinol 274.01 M10.9   2. Essential hypertension - stable; cont meds 401.9 I10   3. Paranoid schizophrenia - stable; cont haldol infusions 295.30 F20.0   4. Chronic kidney disease (CKD), stage IV (severe) - Cr improved; on sodium bicarbonate 585.4 N18.4   5. Chronic diastolic heart failure - stable; cont meds 428.32 I50.32   6. Macrocytic anemia due to B12 defic- cont B12 supplement 281.9 D53.9   7. Tobacco abuse - stable 305.1 Z72.0   8.      Diabetes mellitus on Januvia and SSI  --  check BMP on 3/14 to follow creatinine  --f/u with nephro on 3/24th as scheduled  --control pain  --continue meds as ordered  --PT/OT as tolerated  --GOAL: short term rehab and d/c home when medically appropriate. Communicated with pt and nursing.   Denzil Bristol S. Ancil Linsey  [facility] [address] [place], [place] [address] [phone] Office (Wednesdays and Fridays 8 AM - 5 PM) [phone] Cell (Monday-Friday 8 AM - 5 PM)

## 2015-02-17 ENCOUNTER — Encounter: Payer: Self-pay | Admitting: Internal Medicine

## 2015-02-17 ENCOUNTER — Non-Acute Institutional Stay (SKILLED_NURSING_FACILITY): Payer: Medicare Other | Admitting: Internal Medicine

## 2015-02-17 DIAGNOSIS — M1000 Idiopathic gout, unspecified site: Secondary | ICD-10-CM | POA: Diagnosis not present

## 2015-02-17 DIAGNOSIS — I10 Essential (primary) hypertension: Secondary | ICD-10-CM

## 2015-02-17 DIAGNOSIS — D539 Nutritional anemia, unspecified: Secondary | ICD-10-CM

## 2015-02-17 DIAGNOSIS — N184 Chronic kidney disease, stage 4 (severe): Secondary | ICD-10-CM

## 2015-02-17 DIAGNOSIS — F200 Paranoid schizophrenia: Secondary | ICD-10-CM

## 2015-02-17 NOTE — Progress Notes (Signed)
Patient ID: [name] Wise, male   DOB: 12-12-1950, 64 y.o.   MRN: [id number]    HISTORY AND PHYSICAL  Location:    [facility]   Place of Service:   SNF  Extended Emergency Contact Information Primary Emergency Contact: Eulis Foster [place] Home Phone: [phone] Relation: Friend Secondary Emergency Contact: Jilda Roche [place] Home Phone: [phone] Relation: Daughter  Advanced Directive information   DNR  Chief Complaint  Patient presents with  . New Admit To SNF    gout, CKD stage 4, HTN, elevated troponin with nml stress, schizoparanoid,sinus tachycardia, infrarenal AAA @ 3 cm, hx anemia    HPI:  64 yo male seen today as a new admission into SNF for gout, renal insufficiency and elevated troponin. He reports gout pain controlled. He is scheduled to see nephrology Dr Lowell Guitar for f/u CKD. No f/u appt with cardiology. He is eating and sleeping well. No nursing issues  He has schizophrenia which is stable on cogentin. He is followed by mental health as an o/p.  CBC revealed anemia and labs showed low folate, B12 and iron levels. He was started on supplements to replete levels.  No other concerns  Past Medical History  Diagnosis Date  . Gout   . Hypertension   . Schizophrenia   . Tobacco abuse     Past Surgical History  Procedure Laterality Date  . None      Patient Care Team: Ralene Ok, MD as PCP - General (Internal Medicine)  History   Social History  . Marital Status: Divorced    Spouse Name: N/A  . Number of Children: N/A  . Years of Education: N/A   Occupational History  . Not on file.   Social History Main Topics  . Smoking status: Current Every Day Smoker    Types: Cigarettes  . Smokeless tobacco: Not on file  . Alcohol Use: No  . Drug Use: No  . Sexual Activity: Yes   Other Topics Concern  . Not on file   Social History Narrative     reports that he has been smoking Cigarettes.  He  does not have any smokeless tobacco history on file. He reports that he does not drink alcohol or use illicit drugs.  Family History  Problem Relation Age of Onset  . Hypertension Mother   . Gout Father    No family status information on file.   Past medical, surgical, family and social history reviewed  Immunization History  Administered Date(s) Administered  . Pneumococcal Polysaccharide-23 01/15/2015    No Known Allergies  Medications: Patient's Medications  New Prescriptions   No medications on file  Previous Medications   ALLOPURINOL (ZYLOPRIM) 100 MG TABLET    Take 2 tablets (200 mg total) by mouth daily. Start on 02/16/15   ASPIRIN EC 325 MG EC TABLET    Take 1 tablet (325 mg total) by mouth daily.   BENZTROPINE (COGENTIN) 1 MG TABLET    Take 1 tablet by mouth daily.   FERROUS SULFATE 325 (65 FE) MG TABLET    Take 1 tablet (325 mg total) by mouth 2 (two) times daily with a meal.   FOLIC ACID (FOLVITE) 1 MG TABLET    Take 1 tablet (1 mg total) by mouth daily.   METOPROLOL TARTRATE (LOPRESSOR) 25 MG TABLET    Take 1 tablet (25 mg total) by mouth 2 (two) times daily.   OXYCODONE-ACETAMINOPHEN (PERCOCET/ROXICET) 5-325 MG PER TABLET  Take 1-2 tablets by mouth every 4 (four) hours as needed for moderate pain or severe pain.   PREDNISONE (DELTASONE) 50 MG TABLET    Take 1 tablet (50 mg total) by mouth daily with breakfast. X 4 days   SITAGLIPTIN (JANUVIA) 25 MG TABLET    Take 1 tablet (25 mg total) by mouth daily.   SODIUM BICARBONATE 650 MG TABLET    Take 1,300 mg by mouth 2 (two) times daily.   VITAMIN B-12 500 MCG TABLET    Take 1 tablet (500 mcg total) by mouth daily.  Modified Medications   No medications on file  Discontinued Medications   No medications on file    Review of Systems  Unable to perform ROS: Psychiatric disorder    Filed Vitals:   01/22/15 2208  BP: 120/73  Pulse: 61  Temp: 97.9 F (36.6 C)  SpO2: 95%   There is no weight on file to calculate  BMI.  Physical Exam  Constitutional: He appears well-developed and well-nourished. No distress.  Awake and alert  HENT:  Mouth/Throat: Oropharynx is clear and moist.  Eyes: Pupils are equal, round, and reactive to light. No scleral icterus.  Neck: Neck supple.  Cardiovascular: Normal rate, regular rhythm, normal heart sounds and intact distal pulses.  Exam reveals no gallop and no friction rub.   No murmur heard. No carotid bruit b/l; no distal LE swelling  Pulmonary/Chest: Effort normal and breath sounds normal. He has no wheezes. He has no rales. He exhibits no tenderness.  Abdominal: Soft. Bowel sounds are normal. He exhibits no distension, no abdominal bruit, no pulsatile midline mass and no mass. There is no tenderness. There is no rebound and no guarding.  Lymphadenopathy:    He has no cervical adenopathy.  Neurological: He is alert. He has normal reflexes.  Skin: Skin is warm and dry. No rash noted.  Psychiatric: He has a normal mood and affect. His behavior is normal.     Labs reviewed:       Admission on 01/13/2015, Discharged on 01/20/2015  No results displayed because visit has over 200 results.       Ref Range 4wk ago (01/19/15) 42mo ago (01/18/15) 28mo ago (01/17/15)     Sodium 135 - 145 mmol/L 135 136 138    Potassium 3.5 - 5.1 mmol/L 5.3 (H) 4.9 5.0    Chloride 96 - 112 mmol/L 108 107 107    CO2 19 - 32 mmol/L 18 (L) 20 22    Glucose, Bld 70 - 99 mg/dL 157 (H) 130 (H) 130 (H)    BUN 6 - 23 mg/dL 61 (H) 65 (H) 59 (H)    Creatinine, Ser 0.50 - 1.35 mg/dL 3.16 (H) 3.06 (H) 2.77 (H)    Calcium 8.4 - 10.5 mg/dL 8.2 (L) 8.3 (L) 8.3 (L)    GFR calc non Af Amer >90 mL/min 19 (L) 90 mL/min" class="rz_1b" style="cursor: pointer;" onmouseover='jscript: var varStyle="underline"; var bgColor="#[id number]"; this.style.backgroundColor=bgColor; var children=this.getElementsByTagName("div"); for(var child=0;child 20 (L) 90 mL/min" class="rz_1c" style="cursor: pointer;"  onmouseover='jscript: var varStyle="underline"; var bgColor="#[id number]"; this.style.backgroundColor=bgColor; var children=this.getElementsByTagName("div"); for(var child=0;child 23 (L)    GFR calc Af Amer >90 mL/min 23 (L) 90 mL/min" class="rz_1b" style="cursor: pointer;" onmouseover='jscript: var varStyle="underline"; var bgColor="#[id number]"; this.style.backgroundColor=bgColor; var children=this.getElementsByTagName("div"); for(var child=0;child 23 (L)CM 90 mL/min" class="rz_1c" style="cursor: pointer;" onmouseover='jscript: var varStyle="underline"; var bgColor="#[id number]"; this.style.backgroundColor=bgColor; var children=this.getElementsByTagName("div"); for(var child=0;child 26 (L)CM   Comments: (NOTE)  The eGFR has been calculated using the CKD EPI equation.  This calculation has not been validated  in all clinical situations.  eGFR's persistently <90 mL/min signify possible Chronic Kidney  Disease.      Anion gap 5 - _0 Resulting Agency  [facility]      Specimen Collected: 01/19/15 1:24 AM Last Resulted: 01/19/15 3:02 AM                   Ref Range 13moago (01/18/15) 166mogo (01/16/15) 72m93moo (01/15/15)    WBC 4.0 - 10.5 K/uL 8.3 9.5 11.7 (H)    RBC 4.22 - 5.81 MIL/uL 2.64 (L) 2.61 (L) 2.56 (L)    Hemoglobin 13.0 - 17.0 g/dL 8.6 (L) 8.4 (L) 8.2 (L)    HCT 39.0 - 52.0 % 26.7 (L) 26.2 (L) 26.1 (L)    MCV 78.0 - 100.0 fL 101.1 (H) 100.4 (H) 102.0 (H)    MCH 26.0 - 34.0 pg 32.6 32.2 32.0    MCHC 30.0 - 36.0 g/dL 32.2 32.1 31.4    RDW 11.5 - 15.5 % 14.7 14.4 14.8    Platelets 150 - 400 K/uL 231 249 248   Resulting Agency  [facility]      Specimen Collected: 01/18/15 5:28 AM Last Resulted: 01/18/15 5:45 AM                     Ref Range 72mo22mo    Vitamin B-12 211 - 911 pg/mL 276   Comments: [facility]formed at [facility]       Specimen Collected: 01/15/15 4:30 AM Last Resulted:  01/15/15 2:52 PM                   Folate ng/mL 2.8 (L)   Comments: (NOTE)  Reference Ranges     Deficient:    0.4 - 3.3 ng/mL     Indeterminate:  3.4 - 5.4 ng/mL     Normal:       > 5.4 ng/mL  [facility]formed at [facility]       Specimen Collected: 01/15/15 4:30 AM Last Resulted: 01/15/15 2:52 PM         Ref Range 72mo 90mo   Iron 42 - 165 ug/dL 35 (L)   TIBC 215 - 435 ug/dL 146 (L)   Saturation Ratios 20 - 55 % 24   UIBC 125 - 400 ug/dL 111 (L)   Comments: [facility]formed at [facility]       Specimen Collected: 01/15/15 4:30 AM Last Resulted: 01/15/15 2:32 PM           Ref Range 72mo a18mo  Ferritin 22 - 322 ng/mL 444 (H)   Comments: [facility]formed at [facility]       Specimen Collected: 01/15/15 4:30 AM Last Resulted: 01/15/15 2:52 PM           Ref Range 72mo ag70mo Retic Ct Pct 0.4 - 3.1 % 1.5   RBC. 4.22 - 5.81 MIL/uL 2.56 (L)   Retic Count, Manual 19.0 - 186.0 K/uL 38.4   Resulting Agency [facility]       Specimen Collected: 01/15/15 4:30 AM Last Resulted: 01/15/15 5:53 AM         Mr Mra Abdomen Wo Contrast  01/19/2015   CLINICAL DATA:  Unexplained renal failure. Evaluate for renal artery stenosis. Stage 4  kidney disease with cortical thinning.  EXAM: MRA ABDOMEN WITHOUT CONTRAST  TECHNIQUE: Angiographic images of the chest were obtained using MRA technique without intravenous contrast.  CONTRAST:  None  COMPARISON:  CT abdomen pelvis - 01/17/2015 ; renal ultrasound - 01/15/2015  FINDINGS: The examination is degraded secondary to lack of intravenous contrast due to patient's renal insufficiency. Examination is further degraded secondary to patient's overlying left upper extremity  Vascular Findings:  Abdominal aorta: The abdominal aorta is suboptimally evaluated on this noncontrast examination. The  external caliber of the abdominal aorta is unchanged, measuring approximately 3.0 cm in maximal oblique axial dimension (image 31, series 3).  Celiac artery: There is a minimal amount of eccentric atherosclerotic plaque involving the origin of the celiac artery, not definitely resulting in a hemodynamically significant stenosis. Conventional branching pattern.  SMA: There is a minimal amount of eccentric atherosclerotic plaque involving the origin of the SMA, not definitely resulting in a hemodynamically significant stenosis. The mid and distal aspects of the main trunk of the SMA are suboptimally evaluated.  Right Renal artery: Suboptimally evaluated. There is a apparent minimal amount of focal atherosclerotic plaque involving the origin of the right renal artery though evaluation for hemodynamic significance is degraded secondary to suboptimal vessel enhancement.  Left Renal artery: There is nondiagnostic evaluation of the left renal artery secondary to suboptimal vessel opacification.  IMA: Not evaluated  Review of the MIP images confirms the above findings.   --------------------------------------------------------------------------------  Nonvascular Findings:  Bilateral renal cortical atrophy, left greater than right, appears grossly unchanged. No urinary obstruction or perinephric stranding.  Normal hepatic contour. Normal noncontrast appearance of the gallbladder. Normal noncontrast appearance of the bilateral adrenal glands, pancreas and spleen. Visualized loops of bowel appear normal. No retroperitoneal or mesenteric adenopathy within the imaged upper abdomen.  IMPRESSION: 1. Markedly degraded examination with nondiagnostic evaluation of the left renal artery. There is eccentric atherosclerotic plaque involving the origin of the right renal artery though evaluation for hemodynamic significance is degraded secondary to suboptimal opacification of the downstream right renal artery. If renal artery stenosis  remains of clinical concern, further evaluation could be performed with Renal artery duplex ultrasound which if abnormal could be confirmed with a CO2 angiogram. 2. Grossly unchanged approximately 3.0 cm infrarenal abdominal aortic aneurysm.   Electronically Signed   By: Sandi Mariscal M.D.   On: 01/19/2015 08:09    Dg Chest Port 1 View  01/19/2015   CLINICAL DATA:  Sudden onset of fever.  EXAM: PORTABLE CHEST - 1 VIEW  COMPARISON:  01/13/2015  FINDINGS: A single AP portable view of the chest demonstrates no focal airspace consolidation or alveolar edema. The lungs are grossly clear. There is no large effusion or pneumothorax. Cardiac and mediastinal contours appear unremarkable.  There is no significant interval change.  IMPRESSION: No acute finding   Electronically Signed   By: Andreas Newport M.D.   On: 01/19/2015 01:37   Hospital records reviewed - Cr at d/c 3.34 and he was referred to nephrology for further eval. 2D echo revealed nml EF   Assessment/Plan   ICD-9-CM ICD-10-CM   1. Chronic kidney disease (CKD), stage IV (severe)  585.4 N18.4   2. Macrocytic anemia with Hgb 8.6 on d/c - continue folate, iron and oral B12 281.9 D53.9   3. Essential hypertension - controlled on metoprolol 401.9 I10   4. Paranoid schizophrenia stable on cogentin 295.30 F20.0   5. Idiopathic gout, unspecified chronicity, unspecified site - controlled attacks 274.9 M10.00    --  no medication changes made  --check BMP in 2 days to follow lytes and kidney fxn  --f/u with nephrology as scheduled  --continue PT/OT as indicated  --will follow  --GOAL: short term rehab and d/c home when medically appropriate. Communicated with pt and nursing.   Leonce Bale S. Perlie Gold  [facility] [address] [place], [place] [address] [phone] Office (Wednesdays and Fridays 8 AM - 5 PM) [phone] Cell (Monday-Friday 8 AM - 5 PM)

## 2015-02-19 ENCOUNTER — Inpatient Hospital Stay (HOSPITAL_COMMUNITY): Payer: Medicare Other

## 2015-02-19 ENCOUNTER — Other Ambulatory Visit (HOSPITAL_COMMUNITY): Payer: Self-pay

## 2015-02-19 ENCOUNTER — Emergency Department (HOSPITAL_COMMUNITY): Payer: Medicare Other

## 2015-02-19 ENCOUNTER — Inpatient Hospital Stay (HOSPITAL_COMMUNITY)
Admission: EM | Admit: 2015-02-19 | Discharge: 2015-02-23 | DRG: 488 | Disposition: A | Discharge status: Skilled Nursing Facility | Payer: Medicare Other | Attending: Internal Medicine | Admitting: Internal Medicine

## 2015-02-19 ENCOUNTER — Encounter (HOSPITAL_COMMUNITY): Payer: Self-pay | Admitting: Emergency Medicine

## 2015-02-19 DIAGNOSIS — M1A0611 Idiopathic chronic gout, right knee, with tophus (tophi): Principal | ICD-10-CM | POA: Diagnosis present

## 2015-02-19 DIAGNOSIS — F17210 Nicotine dependence, cigarettes, uncomplicated: Secondary | ICD-10-CM | POA: Diagnosis present

## 2015-02-19 DIAGNOSIS — F200 Paranoid schizophrenia: Secondary | ICD-10-CM | POA: Diagnosis present

## 2015-02-19 DIAGNOSIS — R6510 Systemic inflammatory response syndrome (SIRS) of non-infectious origin without acute organ dysfunction: Secondary | ICD-10-CM

## 2015-02-19 DIAGNOSIS — Z7982 Long term (current) use of aspirin: Secondary | ICD-10-CM | POA: Diagnosis not present

## 2015-02-19 DIAGNOSIS — Z6821 Body mass index (BMI) 21.0-21.9, adult: Secondary | ICD-10-CM | POA: Diagnosis not present

## 2015-02-19 DIAGNOSIS — M25461 Effusion, right knee: Secondary | ICD-10-CM

## 2015-02-19 DIAGNOSIS — R21 Rash and other nonspecific skin eruption: Secondary | ICD-10-CM | POA: Diagnosis present

## 2015-02-19 DIAGNOSIS — Z794 Long term (current) use of insulin: Secondary | ICD-10-CM | POA: Diagnosis not present

## 2015-02-19 DIAGNOSIS — N184 Chronic kidney disease, stage 4 (severe): Secondary | ICD-10-CM | POA: Diagnosis present

## 2015-02-19 DIAGNOSIS — M25561 Pain in right knee: Secondary | ICD-10-CM

## 2015-02-19 DIAGNOSIS — E43 Unspecified severe protein-calorie malnutrition: Secondary | ICD-10-CM | POA: Diagnosis present

## 2015-02-19 DIAGNOSIS — R509 Fever, unspecified: Secondary | ICD-10-CM

## 2015-02-19 DIAGNOSIS — M009 Pyogenic arthritis, unspecified: Secondary | ICD-10-CM | POA: Diagnosis not present

## 2015-02-19 DIAGNOSIS — I129 Hypertensive chronic kidney disease with stage 1 through stage 4 chronic kidney disease, or unspecified chronic kidney disease: Secondary | ICD-10-CM | POA: Diagnosis present

## 2015-02-19 DIAGNOSIS — D509 Iron deficiency anemia, unspecified: Secondary | ICD-10-CM | POA: Diagnosis present

## 2015-02-19 DIAGNOSIS — G9340 Encephalopathy, unspecified: Secondary | ICD-10-CM | POA: Diagnosis present

## 2015-02-19 DIAGNOSIS — I5032 Chronic diastolic (congestive) heart failure: Secondary | ICD-10-CM | POA: Diagnosis present

## 2015-02-19 DIAGNOSIS — A419 Sepsis, unspecified organism: Secondary | ICD-10-CM | POA: Insufficient documentation

## 2015-02-19 DIAGNOSIS — M1009 Idiopathic gout, multiple sites: Secondary | ICD-10-CM | POA: Diagnosis not present

## 2015-02-19 DIAGNOSIS — N179 Acute kidney failure, unspecified: Secondary | ICD-10-CM | POA: Diagnosis present

## 2015-02-19 DIAGNOSIS — R1084 Generalized abdominal pain: Secondary | ICD-10-CM

## 2015-02-19 DIAGNOSIS — M109 Gout, unspecified: Secondary | ICD-10-CM

## 2015-02-19 DIAGNOSIS — M10061 Idiopathic gout, right knee: Secondary | ICD-10-CM | POA: Diagnosis not present

## 2015-02-19 DIAGNOSIS — M25569 Pain in unspecified knee: Secondary | ICD-10-CM

## 2015-02-19 HISTORY — DX: Type 2 diabetes mellitus without complications: E11.9

## 2015-02-19 LAB — CBC
HEMATOCRIT: 27.2 % — AB (ref 39.0–52.0)
Hemoglobin: 8.3 g/dL — ABNORMAL LOW (ref 13.0–17.0)
MCH: 28.9 pg (ref 26.0–34.0)
MCHC: 30.5 g/dL (ref 30.0–36.0)
MCV: 94.8 fL (ref 78.0–100.0)
PLATELETS: 262 10*3/uL (ref 150–400)
RBC: 2.87 MIL/uL — AB (ref 4.22–5.81)
RDW: 16.1 % — ABNORMAL HIGH (ref 11.5–15.5)
WBC: 10.3 10*3/uL (ref 4.0–10.5)

## 2015-02-19 LAB — CREATININE, SERUM
Creatinine, Ser: 3.4 mg/dL — ABNORMAL HIGH (ref 0.50–1.35)
GFR, EST AFRICAN AMERICAN: 21 mL/min — AB (ref >90)
GFR, EST NON AFRICAN AMERICAN: 18 mL/min — AB (ref >90)

## 2015-02-19 LAB — URINE MICROSCOPIC-ADD ON

## 2015-02-19 LAB — COMPREHENSIVE METABOLIC PANEL
ALBUMIN: 2 g/dL — AB (ref 3.5–5.2)
ALT: 18 U/L (ref 0–53)
ANION GAP: 14 (ref 5–15)
AST: 28 U/L (ref 0–37)
Alkaline Phosphatase: 121 U/L — ABNORMAL HIGH (ref 39–117)
BILIRUBIN TOTAL: 0.5 mg/dL (ref 0.3–1.2)
BUN: 46 mg/dL — AB (ref 6–23)
CALCIUM: 8.6 mg/dL (ref 8.4–10.5)
CO2: 23 mmol/L (ref 19–32)
Chloride: 98 mmol/L (ref 96–112)
Creatinine, Ser: 3.39 mg/dL — ABNORMAL HIGH (ref 0.50–1.35)
GFR calc Af Amer: 21 mL/min — ABNORMAL LOW (ref >90)
GFR calc non Af Amer: 18 mL/min — ABNORMAL LOW (ref >90)
GLUCOSE: 152 mg/dL — AB (ref 70–99)
POTASSIUM: 4.7 mmol/L (ref 3.5–5.1)
Sodium: 135 mmol/L (ref 135–145)
Total Protein: 6.8 g/dL (ref 6.0–8.3)

## 2015-02-19 LAB — CBC WITH DIFFERENTIAL/PLATELET
BASOS PCT: 0 % (ref 0–1)
Basophils Absolute: 0 10*3/uL (ref 0.0–0.1)
EOS PCT: 1 % (ref 0–5)
Eosinophils Absolute: 0.1 10*3/uL (ref 0.0–0.7)
HCT: 29 % — ABNORMAL LOW (ref 39.0–52.0)
Hemoglobin: 8.9 g/dL — ABNORMAL LOW (ref 13.0–17.0)
Lymphocytes Relative: 9 % — ABNORMAL LOW (ref 12–46)
Lymphs Abs: 1.2 10*3/uL (ref 0.7–4.0)
MCH: 29.1 pg (ref 26.0–34.0)
MCHC: 30.7 g/dL (ref 30.0–36.0)
MCV: 94.8 fL (ref 78.0–100.0)
Monocytes Absolute: 0.8 10*3/uL (ref 0.1–1.0)
Monocytes Relative: 6 % (ref 3–12)
NEUTROS ABS: 11.4 10*3/uL — AB (ref 1.7–7.7)
Neutrophils Relative %: 84 % — ABNORMAL HIGH (ref 43–77)
Platelets: 313 10*3/uL (ref 150–400)
RBC: 3.06 MIL/uL — ABNORMAL LOW (ref 4.22–5.81)
RDW: 16.2 % — ABNORMAL HIGH (ref 11.5–15.5)
WBC: 13.5 10*3/uL — ABNORMAL HIGH (ref 4.0–10.5)

## 2015-02-19 LAB — URINALYSIS, ROUTINE W REFLEX MICROSCOPIC
Bilirubin Urine: NEGATIVE
Glucose, UA: NEGATIVE mg/dL
KETONES UR: NEGATIVE mg/dL
LEUKOCYTES UA: NEGATIVE
Nitrite: NEGATIVE
Protein, ur: 100 mg/dL — AB
SPECIFIC GRAVITY, URINE: 1.01 (ref 1.005–1.030)
Urobilinogen, UA: 0.2 mg/dL (ref 0.0–1.0)
pH: 7.5 (ref 5.0–8.0)

## 2015-02-19 LAB — CBG MONITORING, ED: GLUCOSE-CAPILLARY: 180 mg/dL — AB (ref 70–99)

## 2015-02-19 LAB — I-STAT CG4 LACTIC ACID, ED
Lactic Acid, Venous: 2.44 mmol/L (ref 0.5–2.0)
Lactic Acid, Venous: 2.3 mmol/L (ref 0.5–2.0)

## 2015-02-19 LAB — MRSA PCR SCREENING: MRSA BY PCR: NEGATIVE

## 2015-02-19 LAB — AMMONIA: Ammonia: 26 umol/L (ref 11–32)

## 2015-02-19 LAB — PROCALCITONIN: PROCALCITONIN: 3.73 ng/mL

## 2015-02-19 LAB — PROTIME-INR
INR: 1.11 (ref 0.00–1.49)
Prothrombin Time: 14.4 seconds (ref 11.6–15.2)

## 2015-02-19 LAB — LACTIC ACID, PLASMA: Lactic Acid, Venous: 2.5 mmol/L (ref 0.5–2.0)

## 2015-02-19 LAB — URIC ACID: Uric Acid, Serum: 7.3 mg/dL (ref 4.0–7.8)

## 2015-02-19 LAB — APTT: aPTT: 43 seconds — ABNORMAL HIGH (ref 24–37)

## 2015-02-19 MED ORDER — VANCOMYCIN HCL IN DEXTROSE 1-5 GM/200ML-% IV SOLN: 1000.0000 mg | Freq: Once | INTRAVENOUS | Status: DC

## 2015-02-19 MED ORDER — SODIUM CHLORIDE 0.9 % IV BOLUS (SEPSIS)
1000.0000 mL | Freq: Once | INTRAVENOUS | Status: AC
  Administered 2015-02-19: 1000 mL via INTRAVENOUS

## 2015-02-19 MED ORDER — SODIUM BICARBONATE 650 MG PO TABS
1300.0000 mg | ORAL_TABLET | Freq: Two times a day (BID) | ORAL | Status: DC
  Administered 2015-02-19 – 2015-02-23 (×7): 1300 mg via ORAL
  Filled 2015-02-19 (×10): qty 2

## 2015-02-19 MED ORDER — HYDROCODONE-ACETAMINOPHEN 5-325 MG PO TABS
1.0000 | ORAL_TABLET | ORAL | Status: DC | PRN
  Administered 2015-02-19 – 2015-02-23 (×7): 1 via ORAL
  Filled 2015-02-19 (×8): qty 1

## 2015-02-19 MED ORDER — HEPARIN SODIUM (PORCINE) 5000 UNIT/ML IJ SOLN
5000.0000 [IU] | Freq: Three times a day (TID) | INTRAMUSCULAR | Status: DC
  Administered 2015-02-19 – 2015-02-23 (×10): 5000 [IU] via SUBCUTANEOUS
  Filled 2015-02-19 (×14): qty 1

## 2015-02-19 MED ORDER — SODIUM CHLORIDE 0.9 % IJ SOLN
3.0000 mL | Freq: Two times a day (BID) | INTRAMUSCULAR | Status: DC
  Administered 2015-02-19 – 2015-02-23 (×2): 3 mL via INTRAVENOUS

## 2015-02-19 MED ORDER — BENZTROPINE MESYLATE 1 MG PO TABS
1.0000 mg | ORAL_TABLET | Freq: Every day | ORAL | Status: DC
  Administered 2015-02-19 – 2015-02-23 (×4): 1 mg via ORAL
  Filled 2015-02-19: qty 2
  Filled 2015-02-19 (×2): qty 1
  Filled 2015-02-19: qty 2
  Filled 2015-02-19: qty 1

## 2015-02-19 MED ORDER — PIPERACILLIN-TAZOBACTAM 3.375 G IVPB 30 MIN: 3.3750 g | Freq: Once | INTRAVENOUS | Status: DC

## 2015-02-19 MED ORDER — VANCOMYCIN HCL IN DEXTROSE 1-5 GM/200ML-% IV SOLN
1000.0000 mg | Freq: Once | INTRAVENOUS | Status: AC
  Administered 2015-02-19: 1000 mg via INTRAVENOUS
  Filled 2015-02-19: qty 200

## 2015-02-19 MED ORDER — PIPERACILLIN-TAZOBACTAM 3.375 G IVPB 30 MIN
3.3750 g | Freq: Once | INTRAVENOUS | Status: AC
  Administered 2015-02-19: 3.375 g via INTRAVENOUS
  Filled 2015-02-19: qty 50

## 2015-02-19 MED ORDER — OSELTAMIVIR PHOSPHATE 75 MG PO CAPS
75.0000 mg | ORAL_CAPSULE | Freq: Two times a day (BID) | ORAL | Status: DC
  Administered 2015-02-19: 75 mg via ORAL
  Filled 2015-02-19 (×4): qty 1

## 2015-02-19 MED ORDER — INSULIN ASPART 100 UNIT/ML ~~LOC~~ SOLN
0.0000 [IU] | SUBCUTANEOUS | Status: DC
  Administered 2015-02-19: 2 [IU] via SUBCUTANEOUS
  Administered 2015-02-20: 1 [IU] via SUBCUTANEOUS
  Administered 2015-02-20: 2 [IU] via SUBCUTANEOUS
  Administered 2015-02-21: 1 [IU] via SUBCUTANEOUS
  Administered 2015-02-21: 2 [IU] via SUBCUTANEOUS
  Administered 2015-02-21: 1 [IU] via SUBCUTANEOUS
  Administered 2015-02-22: 2 [IU] via SUBCUTANEOUS
  Administered 2015-02-22: 1 [IU] via SUBCUTANEOUS
  Administered 2015-02-22: 2 [IU] via SUBCUTANEOUS
  Administered 2015-02-22: 1 [IU] via SUBCUTANEOUS
  Administered 2015-02-22: 2 [IU] via SUBCUTANEOUS
  Filled 2015-02-19: qty 1

## 2015-02-19 MED ORDER — METOPROLOL TARTRATE 25 MG PO TABS
25.0000 mg | ORAL_TABLET | Freq: Two times a day (BID) | ORAL | Status: DC
  Administered 2015-02-19 – 2015-02-23 (×7): 25 mg via ORAL
  Filled 2015-02-19 (×9): qty 1

## 2015-02-19 MED ORDER — VANCOMYCIN HCL 500 MG IV SOLR
500.0000 mg | INTRAVENOUS | Status: DC
  Filled 2015-02-19: qty 500

## 2015-02-19 MED ORDER — PIPERACILLIN-TAZOBACTAM IN DEX 2-0.25 GM/50ML IV SOLN
2.2500 g | Freq: Three times a day (TID) | INTRAVENOUS | Status: DC
  Administered 2015-02-20 – 2015-02-22 (×8): 2.25 g via INTRAVENOUS
  Filled 2015-02-19 (×10): qty 50

## 2015-02-19 MED ORDER — LIDOCAINE HCL 2 % IJ SOLN
20.0000 mL | Freq: Once | INTRAMUSCULAR | Status: AC
  Administered 2015-02-19: 400 mg via INTRADERMAL
  Filled 2015-02-19: qty 20

## 2015-02-19 MED ORDER — INSULIN GLARGINE 100 UNIT/ML ~~LOC~~ SOLN
10.0000 [IU] | Freq: Every day | SUBCUTANEOUS | Status: DC
  Administered 2015-02-21 – 2015-02-22 (×3): 10 [IU] via SUBCUTANEOUS
  Filled 2015-02-19 (×5): qty 0.1

## 2015-02-19 MED ORDER — SODIUM CHLORIDE 0.9 % IV SOLN
INTRAVENOUS | Status: DC
  Administered 2015-02-20: 02:00:00 via INTRAVENOUS

## 2015-02-19 MED ORDER — ALLOPURINOL 100 MG PO TABS
200.0000 mg | ORAL_TABLET | Freq: Every day | ORAL | Status: DC
  Administered 2015-02-20 – 2015-02-23 (×3): 200 mg via ORAL
  Filled 2015-02-19 (×4): qty 2

## 2015-02-19 NOTE — Consult Note (Signed)
ORTHOPAEDIC CONSULTATION  REQUESTING PHYSICIAN: Evelina Bucy, MD  Chief Complaint: R knee pain  HPI: [name] Wise is a 64 y.o. male who has a history of gout as well as schizophrenia diabetes and presented initially with sepsis and encephalopathy. Medicine team is admitting this patient. He had an aspiration of his right knee attempted in the emergency department no fluid was obtained. In discussion with the patient and his English is not his primary language and he has been noted to not communicate well due to this versus a schizophrenia in the past. He reports that he has had several months of pain in his right knee he reports that he does not walk on it he reports he uses a wheelchair.  Past Medical History  Diagnosis Date  . Gout   . Hypertension   . Schizophrenia   . Tobacco abuse    Past Surgical History  Procedure Laterality Date  . None     History   Social History  . Marital Status: Divorced    Spouse Name: N/A  . Number of Children: N/A  . Years of Education: N/A   Social History Main Topics  . Smoking status: Current Every Day Smoker    Types: Cigarettes  . Smokeless tobacco: Not on file  . Alcohol Use: No  . Drug Use: No  . Sexual Activity: Yes   Other Topics Concern  . None   Social History Narrative   Family History  Problem Relation Age of Onset  . Hypertension Mother   . Gout Father    No Known Allergies Prior to Admission medications   Medication Sig Start Date End Date Taking? Authorizing Provider  allopurinol (ZYLOPRIM) 100 MG tablet Take 2 tablets (200 mg total) by mouth daily. Start on 02/16/15 02/16/15  Yes Orson Eva, MD  aspirin EC 325 MG EC tablet Take 1 tablet (325 mg total) by mouth daily. 01/20/15  Yes Costin Karlyne Greenspan, MD  benztropine (COGENTIN) 1 MG tablet Take 1 tablet by mouth daily. 12/15/14  Yes Historical Provider, MD  ferrous sulfate 325 (65 FE) MG tablet Take 1 tablet (325 mg total) by mouth 2 (two) times daily with a meal.  02/09/15  Yes Orson Eva, MD  folic acid (FOLVITE) 1 MG tablet Take 1 tablet (1 mg total) by mouth daily. 01/20/15  Yes Costin Karlyne Greenspan, MD  insulin glargine (LANTUS) 100 UNIT/ML injection Inject 10 Units into the skin at bedtime.   Yes Historical Provider, MD  insulin lispro (HUMALOG) 100 UNIT/ML injection Inject 5 Units into the skin 3 (three) times daily before meals.   Yes Historical Provider, MD  metoprolol tartrate (LOPRESSOR) 25 MG tablet Take 1 tablet (25 mg total) by mouth 2 (two) times daily. 01/20/15  Yes Costin Karlyne Greenspan, MD  oxyCODONE (OXY IR/ROXICODONE) 5 MG immediate release tablet Take 10 mg by mouth every 4 (four) hours as needed for severe pain (Hopd for sedation, confusion, or respiratory distress.).   Yes Historical Provider, MD  probenecid (BENEMID) 500 MG tablet Take 500 mg by mouth 2 (two) times daily.   Yes Historical Provider, MD  sitaGLIPtin (JANUVIA) 25 MG tablet Take 1 tablet (25 mg total) by mouth daily. 02/09/15  Yes Orson Eva, MD  sodium bicarbonate 650 MG tablet Take 1,300 mg by mouth 2 (two) times daily.   Yes Historical Provider, MD  vitamin B-12 500 MCG tablet Take 1 tablet (500 mcg total) by mouth daily. 02/09/15  Yes Orson Eva, MD  oxyCODONE-acetaminophen (PERCOCET/ROXICET) 5-325 MG per tablet Take 1-2 tablets by mouth every 4 (four) hours as needed for moderate pain or severe pain. Patient not taking: Reported on 02/19/2015 02/11/15   Estill Dooms, MD  predniSONE (DELTASONE) 50 MG tablet Take 1 tablet (50 mg total) by mouth daily with breakfast. X 4 days Patient not taking: Reported on 02/19/2015 02/09/15   Orson Eva, MD   Dg Chest Port 1 View  02/19/2015   CLINICAL DATA:  Patient with fever and rash. History of schizophrenia.  EXAM: PORTABLE CHEST - 1 VIEW  COMPARISON:  02/06/2015  FINDINGS: Monitoring leads overlie the patient. Stable enlarged cardiac and mediastinal contours. Pulmonary vascular redistribution. Low lung volumes. Bibasilar heterogeneous opacities. No  definite pleural effusion or pneumothorax.  IMPRESSION: Low lung volumes with basilar atelectasis.  Cardiomegaly without definite evidence for acute pulmonary edema.   Electronically Signed   By: Lovey Newcomer M.D.   On: 02/19/2015 17:16    Positive ROS: All other systems have been reviewed and were otherwise negative with the exception of those mentioned in the HPI and as above.  Labs cbc  Recent Labs  02/19/15 1617  WBC 13.5*  HGB 8.9*  HCT 29.0*  PLT 313    Labs inflam No results for input(s): CRP in the last 72 hours.  Invalid input(s): ESR  Labs coag No results for input(s): INR, PTT in the last 72 hours.  Invalid input(s): PT   Recent Labs  02/19/15 1720  NA 135  K 4.7  CL 98  CO2 23  GLUCOSE 152*  BUN 46*  CREATININE 3.39*  CALCIUM 8.6    Physical Exam: Filed Vitals:   02/19/15 1944  BP: 150/91  Pulse: 118  Temp: 99.5 F (37.5 C)  Resp: 14   General: Alert, no acute distress Cardiovascular: No pedal edema Respiratory: No cyanosis, no use of accessory musculature GI: No organomegaly, abdomen is soft and non-tender Skin: No lesions in the area of chief complaint other than those listed below in MSK exam.  Neurologic: Sensation intact distally Psychiatric: difficult affect to accept Lymphatic: No axillary or cervical lymphadenopathy  MUSCULOSKELETAL:  RLE: He has some mild superficial swelling and possible spinal fusion to his right knee. There is no surrounding erythema he has no pain with smaller motion. He does have pain on top palpation of the knee joint. He has no warmth. Distally he has good pulses he wiggles his toes and his ankle he does not participate with any other neurovascular exam. Compartments are soft Other extremities are atraumatic  Assessment: Right knee pain question acute on chronic gout flare.  Plan: He had a negative aspiration attempt in the emergency department. Should this still be a possible source of sepsis the next step  would be ultrasound-guided aspiration by VIR to further attempt to get fluid. He's been treated multiple times for gout and it is very possible that he has a gout flare. VIR tap would be the only way to know for sure if we are unable to find another source of sepsis.  Recommend treating for gout and consider VIR aspiration of R knee if needed.  Please call me should an aspiration be ordered so that I can follow it up closely.   WBAT, PT   Renette Butters, MD Cell [phone]   02/19/2015 7:48 PM

## 2015-02-19 NOTE — ED Notes (Signed)
Below orders not completed by EW. 

## 2015-02-19 NOTE — ED Notes (Signed)
Bed: WA06 Expected date:  Expected time:  Means of arrival:  Comments: No bed no monitor

## 2015-02-19 NOTE — Progress Notes (Signed)
ANTIBIOTIC CONSULT NOTE - INITIAL  Pharmacy Consult for Vancomycin and Zosyn Indication: rule out sepsis  No Known Allergies  Patient Measurements:   Height: 66 inches Weight: 57 kg  Vital Signs: Temp: 99.5 F (37.5 C) (03/17 1944) Temp Source: Oral (03/17 1944) BP: 150/91 mmHg (03/17 1944) Pulse Rate: 118 (03/17 1944) Intake/Output from previous day:   Intake/Output from this shift:    Labs:  Recent Labs  02/19/15 1617 02/19/15 1720  WBC 13.5*  --   HGB 8.9*  --   PLT 313  --   CREATININE  --  3.39*   Estimated Creatinine Clearance: 18 mL/min (by C-G formula based on Cr of 3.39). No results for input(s): VANCOTROUGH, VANCOPEAK, VANCORANDOM, GENTTROUGH, GENTPEAK, GENTRANDOM, TOBRATROUGH, TOBRAPEAK, TOBRARND, AMIKACINPEAK, AMIKACINTROU, AMIKACIN in the last 72 hours.   Microbiology: Recent Results (from the past 720 hour(s))  Culture, Urine     Status: None   Collection Time: 02/03/15  9:00 PM  Result Value Ref Range Status   Specimen Description URINE, RANDOM  Final   Special Requests NONE  Final   Colony Count NO GROWTH Performed at [facility]   Final   Culture NO GROWTH Performed at [facility]   Final   Report Status 02/05/2015 FINAL  Final  MRSA PCR Screening     Status: None   Collection Time: 02/04/15 12:34 AM  Result Value Ref Range Status   MRSA by PCR NEGATIVE NEGATIVE Final    Comment:        The GeneXpert MRSA Assay (FDA approved for NASAL specimens only), is one component of a comprehensive MRSA colonization surveillance program. It is not intended to diagnose MRSA infection nor to guide or monitor treatment for MRSA infections.   Culture, blood (routine x 2)     Status: None   Collection Time: 02/05/15  5:15 AM  Result Value Ref Range Status   Specimen Description BLOOD RIGHT FOREARM  Final   Special Requests BOTTLES DRAWN AEROBIC ONLY 5ML  Final   Culture   Final    STAPHYLOCOCCUS SPECIES (COAGULASE  NEGATIVE) Note: THE SIGNIFICANCE OF ISOLATING THIS ORGANISM FROM A SINGLE SET OF BLOOD CULTURES WHEN MULTIPLE SETS ARE DRAWN IS UNCERTAIN. PLEASE NOTIFY THE MICROBIOLOGY DEPARTMENT WITHIN ONE WEEK IF SPECIATION AND SENSITIVITIES ARE REQUIRED. Note: Gram Stain Report Called to,Read Back By and Verified With: LIRA VERDELDIDIOS AT 4:32 A.M. ON 02/06/2015 WARRB Performed at [facility]    Report Status 02/07/2015 FINAL  Final  Culture, blood (routine x 2)     Status: None   Collection Time: 02/05/15  5:17 AM  Result Value Ref Range Status   Specimen Description BLOOD RIGHT HAND  Final   Special Requests BOTTLES DRAWN AEROBIC ONLY 3ML  Final   Culture   Final    NO GROWTH 5 DAYS Performed at [facility]    Report Status 02/11/2015 FINAL  Final  Culture, Urine     Status: None   Collection Time: 02/05/15  4:42 PM  Result Value Ref Range Status   Specimen Description URINE, CLEAN CATCH  Final   Special Requests NONE  Final   Colony Count   Final    >=100,000 COLONIES/ML Performed at [facility]    Culture   Final    Multiple bacterial morphotypes present, none predominant. Suggest appropriate recollection if clinically indicated. Performed at [facility]    Report Status 02/06/2015 FINAL  Final  Clostridium Difficile by PCR  Status: None   Collection Time: 02/08/15  5:07 AM  Result Value Ref Range Status   C difficile by pcr NEGATIVE NEGATIVE Final    Medical History: Past Medical History  Diagnosis Date  . Gout   . Hypertension   . Schizophrenia   . Tobacco abuse     Medications:  Scheduled:  . allopurinol  200 mg Oral Daily  . benztropine  1 mg Oral Daily  . heparin  5,000 Units Subcutaneous 3 times per day  . insulin aspart  0-9 Units Subcutaneous 6 times per day  . insulin glargine  10 Units Subcutaneous QHS  . metoprolol tartrate  25 mg Oral BID  . sodium bicarbonate  1,300 mg Oral BID  . sodium chloride  3 mL Intravenous Q12H    Infusions:  . sodium chloride     PRN:   Assessment: 64 yo male from [facility] c/o rash, knee swelling and fever 102.3. Pharmacy is consulted to dose vancomycin and zosyn for possible septic arthritis.  3/17 >> Vanc >> 3/17 >> Zosyn >>  Tmax: 102.3 WBC: 13.5 CKD IV: SCr 3.39, CrCl 18CG, 22N Lactate: 2.44  3/17 blood: sent 3/17 urine: sent 3/17 R knee fluid: sent  Goal of Therapy:  Vancomycin trough level 15-20 mcg/ml  Zosyn dose appropriate for renal function  Plan:   Zosyn 3.375g and Vancomycin 1g IV given in ED ~16:30   Vancomycin  IV q24h - next dose 3/18 at 6PM Check trough at steady state Zosyn 2.25g IV q8h Follow up renal function & cultures, clinical course  Loralee Pacas, PharmD, BCPS Pager: [phone] 02/19/2015,8:00 PM

## 2015-02-19 NOTE — ED Notes (Signed)
Below orders not completed EW.

## 2015-02-19 NOTE — ED Notes (Signed)
EDP made aware of patient CG4 lactic result. 

## 2015-02-19 NOTE — H&P (Addendum)
Hospitalist Admission History and Physical  Patient name: [name] Wise Milone Medical record number: [id number] Date of birth: 16-Jan-1951 Age: 64 y.o. Gender: male  Primary Care Provider: Ralene OkMOREIRA,ROY, MD  Chief Complaint: sepsis, encephalopathy   History of Present Illness:This is a 64 y.o. year old male with significant past medical history of schizophrenia, HTN, gout, IDDM, stage IV CKD, chronic diastolic heart failure presenting with sepsis, encephalopathy. Pt resident of local SNF. Per report, pt w/ noted temp 102.3. ? Rash as well as bilateral knee swelling. Patient noted to have been admitted for similar symptoms approximately 10 days ago. Was noted to have a fever as well as swollen right knee and encephalopathy. Patient had overall symptomatic improvement with cardio arthritis treatment. Did have 1 blood culture that grew out coag-negative staph. Was placed on steroid taper for gout Was otherwise a baseline by hospital discharge per report. Patient has somewhat limited English however primary complaints are abdominal and knee pain. Presented to the ER temperature 90.3, heart rate in the 110s, respirations in the tens, blood pressure in the 120s to 190s, satting 99% on room air. White blood cell count 13.5, hemoglobin 8.9, creatinine 3.39. Glucose 152. Lactate 2.4. Urinalysis not indicative of infection. Knee aspiration attempted at bedside by ER physician that was unsuccessful. Chest x-ray negative for acute infiltrate.  Assessment and Plan: [name] Wise Busenbark is a 64 y.o. year old male presenting with sepsis, encephalopathy    Active Problems:   Sepsis   1-Sepsis -meets criteria by T, HR, WBC count -unclear etiology -UA and CXR negative for infection on presentation -no nuchal rigidity on exam -noted R knee-? Acute on chronic gout flare -failed R knee aspiration in ER -ortho consult (I have asked EDP to formally consult) -noted coag neg staph blood culture during last admission.  -repeat blood  and urine cultures -IV vanc and zosyn -flu panel  -tamiflu given increased local flu incidence recently.   2-Encephalopathy  -likely multifactorial in setting of above and baseline schizophrenia -unclear of pt's relative baseline  -head CT -ammonia  -follow  3-IDDM -SSI  -A1C  4-Gout  -noted R knee swelling -? Gout flare  -cont allopurinol  -f/u ortho recs (i have asked EDP to consult ortho concerning R knee) -xray  5-AKI -baseline stage IV CKD -dry on exam -gently hydrate -hold nephrotoxic agents  6-Diastolic CHF -EF 40-98%55-60%, grade 1 diastolic dysfunction on 2D ECHO 01/2015 -dry on exam  -? Cardiomegaly on imaging -BNP to correlate -gentle hydration in setting of ? Sepsis  7-? Rash  -trace on abdomen -non tender -unclear of duration  -dx includes viral/medication/bacterial/inflammatory/autoimmune etiologies -treat as above follow   8- HTN -BP stable  -cont home regimen  9-Psych  -stable  -cont home regimen   FEN/GI: NPO for now  Prophylaxis: sub q heparin  Disposition: pending further evaluation  Code Status:Full Code    Patient Active Problem List   Diagnosis Date Noted  . Sepsis 02/19/2015  . Chronic kidney disease (CKD), stage IV (severe) 02/12/2015  . Chronic diastolic heart failure 02/12/2015  . Aphasia   . Acute on chronic renal failure 02/06/2015  . Acute encephalopathy 02/06/2015  . Acute on chronic diastolic CHF (congestive heart failure) 02/06/2015  . Gram-positive bacteremia 02/06/2015  . Altered mental status 02/03/2015  . Elevated troponin 02/03/2015  . Slurred speech 02/03/2015  . Anemia 02/03/2015  . Hyperkalemia   . UTI (lower urinary tract infection)   . Demand ischemia   . History of recent hospitalization   .  Paranoid schizophrenia 01/18/2015  . Anemia, iron deficiency   . Folic acid deficiency   . Primary gout   . Acute kidney injury   . Demand ischemia of myocardium   . Essential hypertension   . Tobacco abuse   .  Gout flare 01/13/2015  . Leukocytosis 01/13/2015  . Hyponatremia 01/13/2015  . Macrocytic anemia 01/13/2015  . Sinus tachycardia 01/13/2015  . AKI (acute kidney injury) 01/13/2015   Past Medical History: Past Medical History  Diagnosis Date  . Gout   . Hypertension   . Schizophrenia   . Tobacco abuse     Past Surgical History: Past Surgical History  Procedure Laterality Date  . None      Social History: History   Social History  . Marital Status: Divorced    Spouse Name: N/A  . Number of Children: N/A  . Years of Education: N/A   Social History Main Topics  . Smoking status: Current Every Day Smoker    Types: Cigarettes  . Smokeless tobacco: Not on file  . Alcohol Use: No  . Drug Use: No  . Sexual Activity: Yes   Other Topics Concern  . None   Social History Narrative    Family History: Family History  Problem Relation Age of Onset  . Hypertension Mother   . Gout Father     Allergies: No Known Allergies  Current Facility-Administered Medications  Medication Dose Route Frequency Provider Last Rate Last Dose  . 0.9 %  sodium chloride infusion   Intravenous Continuous Floydene Flock, MD      . allopurinol (ZYLOPRIM) tablet 200 mg  200 mg Oral Daily Floydene Flock, MD      . benztropine (COGENTIN) tablet 1 mg  1 mg Oral Daily Floydene Flock, MD      . heparin injection 5,000 Units  5,000 Units Subcutaneous 3 times per day Floydene Flock, MD      . insulin aspart (novoLOG) injection 0-9 Units  0-9 Units Subcutaneous 6 times per day Floydene Flock, MD      . insulin glargine (LANTUS) injection 10 Units  10 Units Subcutaneous QHS Floydene Flock, MD      . metoprolol tartrate (LOPRESSOR) tablet 25 mg  25 mg Oral BID Floydene Flock, MD      . piperacillin-tazobactam (ZOSYN) IVPB 3.375 g  3.375 g Intravenous Once Floydene Flock, MD      . sodium bicarbonate tablet 1,300 mg  1,300 mg Oral BID Floydene Flock, MD      . sodium chloride 0.9 % injection 3  mL  3 mL Intravenous Q12H Floydene Flock, MD      . vancomycin (VANCOCIN) IVPB 1000 mg/200 mL premix  1,000 mg Intravenous Once Floydene Flock, MD       Current Outpatient Prescriptions  Medication Sig Dispense Refill  . allopurinol (ZYLOPRIM) 100 MG tablet Take 2 tablets (200 mg total) by mouth daily. Start on 02/16/15 60 tablet 1  . aspirin EC 325 MG EC tablet Take 1 tablet (325 mg total) by mouth daily. 30 tablet 0  . benztropine (COGENTIN) 1 MG tablet Take 1 tablet by mouth daily.    . ferrous sulfate 325 (65 FE) MG tablet Take 1 tablet (325 mg total) by mouth 2 (two) times daily with a meal. 60 tablet 0  . folic acid (FOLVITE) 1 MG tablet Take 1 tablet (1 mg total) by mouth daily.    . insulin  glargine (LANTUS) 100 UNIT/ML injection Inject 10 Units into the skin at bedtime.    . insulin lispro (HUMALOG) 100 UNIT/ML injection Inject 5 Units into the skin 3 (three) times daily before meals.    . metoprolol tartrate (LOPRESSOR) 25 MG tablet Take 1 tablet (25 mg total) by mouth 2 (two) times daily.    Marland Kitchen oxyCODONE (OXY IR/ROXICODONE) 5 MG immediate release tablet Take 10 mg by mouth every 4 (four) hours as needed for severe pain (Hopd for sedation, confusion, or respiratory distress.).    Marland Kitchen probenecid (BENEMID) 500 MG tablet Take 500 mg by mouth 2 (two) times daily.    . sitaGLIPtin (JANUVIA) 25 MG tablet Take 1 tablet (25 mg total) by mouth daily. 30 tablet 1  . sodium bicarbonate 650 MG tablet Take 1,300 mg by mouth 2 (two) times daily.    . vitamin B-12 500 MCG tablet Take 1 tablet (500 mcg total) by mouth daily. 30 tablet 0  . oxyCODONE-acetaminophen (PERCOCET/ROXICET) 5-325 MG per tablet Take 1-2 tablets by mouth every 4 (four) hours as needed for moderate pain or severe pain. (Patient not taking: Reported on 02/19/2015) 180 tablet 0  . predniSONE (DELTASONE) 50 MG tablet Take 1 tablet (50 mg total) by mouth daily with breakfast. X 4 days (Patient not taking: Reported on 02/19/2015) 4 tablet 0    Review Of Systems: 12 point ROS negative except as noted above in HPI.  Physical Exam: Filed Vitals:   02/19/15 1944  BP: 150/91  Pulse: 118  Temp: 99.5 F (37.5 C)  Resp: 14    General: cooperative, mildly confused  HEENT: PERRLA, extra ocular movement intact and dry oral mucosa  Heart: S1, S2 normal, no murmur, rub or gallop, regular rate and rhythm Lungs: clear to auscultation, no wheezes or rales and unlabored breathing Abdomen: abdomen is soft without significant tenderness, masses, organomegaly or guarding Extremities: R knee swelling and pain w/ knee extension, neurovascularly intact distally  Skin:trace rash on abdomen Neurology: normal without focal findings  Labs and Imaging: Lab Results  Component Value Date/Time   NA 135 02/19/2015 05:20 PM   K 4.7 02/19/2015 05:20 PM   CL 98 02/19/2015 05:20 PM   CO2 23 02/19/2015 05:20 PM   BUN 46* 02/19/2015 05:20 PM   CREATININE 3.39* 02/19/2015 05:20 PM   GLUCOSE 152* 02/19/2015 05:20 PM   Lab Results  Component Value Date   WBC 13.5* 02/19/2015   HGB 8.9* 02/19/2015   HCT 29.0* 02/19/2015   MCV 94.8 02/19/2015   PLT 313 02/19/2015    Dg Chest Port 1 View  02/19/2015   CLINICAL DATA:  Patient with fever and rash. History of schizophrenia.  EXAM: PORTABLE CHEST - 1 VIEW  COMPARISON:  02/06/2015  FINDINGS: Monitoring leads overlie the patient. Stable enlarged cardiac and mediastinal contours. Pulmonary vascular redistribution. Low lung volumes. Bibasilar heterogeneous opacities. No definite pleural effusion or pneumothorax.  IMPRESSION: Low lung volumes with basilar atelectasis.  Cardiomegaly without definite evidence for acute pulmonary edema.   Electronically Signed   By: Annia Belt M.D.   On: 02/19/2015 17:16           Doree Albee MD  Pager: [phone]

## 2015-02-19 NOTE — ED Notes (Signed)
[facility] rash and fever 102.3 per staff. Pt has schizophrenia, per staff pt is not ambulatory today. Pt has limited English . Pt is alert . Also, co bilateral knee swelling, Hx Gout.

## 2015-02-19 NOTE — ED Provider Notes (Signed)
CSN: [id number]     Arrival date & time 02/19/15  1619 History   First MD Initiated Contact with Patient 02/19/15 1616     Chief Complaint  Patient presents with  . rash/fever      (Consider location/radiation/quality/duration/timing/severity/associated sxs/prior Treatment) Patient is a 64 y.o. male presenting with fever. The history is provided by the EMS personnel and the nursing home.  Fever Max temp prior to arrival:  102.4 Temp source:  Rectal Severity:  Moderate Onset quality:  Gradual Duration:  1 day Timing:  Constant Progression:  Unchanged Chronicity:  New Relieved by:  Nothing Worsened by:  Nothing tried Associated symptoms: rash   Associated symptoms: no chest pain and no cough     Past Medical History  Diagnosis Date  . Gout   . Hypertension   . Schizophrenia   . Tobacco abuse    Past Surgical History  Procedure Laterality Date  . None     Family History  Problem Relation Age of Onset  . Hypertension Mother   . Gout Father    History  Substance Use Topics  . Smoking status: Current Every Day Smoker    Types: Cigarettes  . Smokeless tobacco: Not on file  . Alcohol Use: No    Review of Systems  Unable to perform ROS: Acuity of condition  Constitutional: Positive for fever.  Respiratory: Negative for cough.   Cardiovascular: Negative for chest pain.  Skin: Positive for rash.      Allergies  Review of patient's allergies indicates no known allergies.  Home Medications   Prior to Admission medications   Medication Sig Start Date End Date Taking? Authorizing Provider  allopurinol (ZYLOPRIM) 100 MG tablet Take 2 tablets (200 mg total) by mouth daily. Start on 02/16/15 02/16/15  Yes Catarina Hartshornavid Tat, MD  aspirin EC 325 MG EC tablet Take 1 tablet (325 mg total) by mouth daily. 01/20/15  Yes Costin Otelia SergeantM Gherghe, MD  benztropine (COGENTIN) 1 MG tablet Take 1 tablet by mouth daily. 12/15/14  Yes Historical Provider, MD  ferrous sulfate 325 (65 FE) MG tablet  Take 1 tablet (325 mg total) by mouth 2 (two) times daily with a meal. 02/09/15  Yes Catarina Hartshornavid Tat, MD  folic acid (FOLVITE) 1 MG tablet Take 1 tablet (1 mg total) by mouth daily. 01/20/15  Yes Costin Otelia SergeantM Gherghe, MD  insulin glargine (LANTUS) 100 UNIT/ML injection Inject 10 Units into the skin at bedtime.   Yes Historical Provider, MD  insulin lispro (HUMALOG) 100 UNIT/ML injection Inject 5 Units into the skin 3 (three) times daily before meals.   Yes Historical Provider, MD  metoprolol tartrate (LOPRESSOR) 25 MG tablet Take 1 tablet (25 mg total) by mouth 2 (two) times daily. 01/20/15  Yes Costin Otelia SergeantM Gherghe, MD  oxyCODONE (OXY IR/ROXICODONE) 5 MG immediate release tablet Take 10 mg by mouth every 4 (four) hours as needed for severe pain (Hopd for sedation, confusion, or respiratory distress.).   Yes Historical Provider, MD  probenecid (BENEMID) 500 MG tablet Take 500 mg by mouth 2 (two) times daily.   Yes Historical Provider, MD  sitaGLIPtin (JANUVIA) 25 MG tablet Take 1 tablet (25 mg total) by mouth daily. 02/09/15  Yes Catarina Hartshornavid Tat, MD  sodium bicarbonate 650 MG tablet Take 1,300 mg by mouth 2 (two) times daily.   Yes Historical Provider, MD  vitamin B-12 500 MCG tablet Take 1 tablet (500 mcg total) by mouth daily. 02/09/15  Yes Catarina Hartshornavid Tat, MD  oxyCODONE-acetaminophen (PERCOCET/ROXICET) [phone]  MG per tablet Take 1-2 tablets by mouth every 4 (four) hours as needed for moderate pain or severe pain. Patient not taking: Reported on 02/19/2015 02/11/15   Kimber Relic, MD  predniSONE (DELTASONE) 50 MG tablet Take 1 tablet (50 mg total) by mouth daily with breakfast. X 4 days Patient not taking: Reported on 02/19/2015 02/09/15   Catarina Hartshorn, MD   BP 103/37 mmHg  Pulse 82  Temp(Src) 98.4 F (36.9 C) (Oral)  Resp 14  Ht 5\' 3"  (1.6 m)  Wt 122 lb 12.7 oz (55.7 kg)  BMI 21.76 kg/m2  SpO2 98% Physical Exam  Constitutional: He appears well-developed and well-nourished. No distress.  HENT:  Head: Normocephalic and atraumatic.   Mouth/Throat: No oropharyngeal exudate.  Eyes: EOM are normal. Pupils are equal, round, and reactive to light.  Neck: Normal range of motion. Neck supple.  Cardiovascular: Normal rate and regular rhythm.  Exam reveals no friction rub.   No murmur heard. Pulmonary/Chest: Effort normal and breath sounds normal. No respiratory distress. He has no wheezes. He has no rales.  Abdominal: He exhibits no distension. There is no tenderness. There is no rebound.  Musculoskeletal: He exhibits no edema.       Right knee: He exhibits decreased range of motion and effusion. He exhibits no deformity, no laceration and no erythema. Tenderness (diffuse) found.  Neurological: He is alert. He exhibits normal muscle tone.  Skin: He is not diaphoretic.  Nursing note and vitals reviewed.   ED Course  ARTHOCENTESIS Date/Time: 02/20/2015 12:30 PM Performed by: Elwin Mocha Authorized by: Elwin Mocha Consent: The procedure was performed in an emergent situation. Time out: Immediately prior to procedure a "time out" was called to verify the correct patient, procedure, equipment, support staff and site/side marked as required. Indications: joint swelling and possible septic joint  Body area: knee Joint: right knee Local anesthesia used: yes Local anesthetic: lidocaine 2% without epinephrine Anesthetic total: 6 ml Preparation: Patient was prepped and draped in the usual sterile fashion. Needle gauge: 21. Ultrasound guidance: no Approach: medial Aspirate amount: 0 mL Patient tolerance: Patient tolerated the procedure well with no immediate complications   (including critical care time) Labs Review Labs Reviewed  CBC WITH DIFFERENTIAL/PLATELET - Abnormal; Notable for the following:    WBC 13.5 (*)    RBC 3.06 (*)    Hemoglobin 8.9 (*)    HCT 29.0 (*)    RDW 16.2 (*)    Neutrophils Relative % 84 (*)    Lymphocytes Relative 9 (*)    Neutro Abs 11.4 (*)    All other components within normal limits   URINALYSIS, ROUTINE W REFLEX MICROSCOPIC - Abnormal; Notable for the following:    Hgb urine dipstick TRACE (*)    Protein, ur 100 (*)    All other components within normal limits  COMPREHENSIVE METABOLIC PANEL - Abnormal; Notable for the following:    Glucose, Bld 152 (*)    BUN 46 (*)    Creatinine, Ser 3.39 (*)    Albumin 2.0 (*)    Alkaline Phosphatase 121 (*)    GFR calc non Af Amer 18 (*)    GFR calc Af Amer 21 (*)    All other components within normal limits  LACTIC ACID, PLASMA - Abnormal; Notable for the following:    Lactic Acid, Venous 2.5 (*)    All other components within normal limits  APTT - Abnormal; Notable for the following:    aPTT 43 (*)  All other components within normal limits  COMPREHENSIVE METABOLIC PANEL - Abnormal; Notable for the following:    Glucose, Bld 131 (*)    BUN 39 (*)    Creatinine, Ser 3.23 (*)    Calcium 7.9 (*)    Total Protein 5.6 (*)    Albumin 1.6 (*)    GFR calc non Af Amer 19 (*)    GFR calc Af Amer 22 (*)    All other components within normal limits  CBC WITH DIFFERENTIAL/PLATELET - Abnormal; Notable for the following:    WBC 11.6 (*)    RBC 2.39 (*)    Hemoglobin 7.2 (*)    HCT 22.6 (*)    RDW 16.3 (*)    Neutrophils Relative % 87 (*)    Lymphocytes Relative 6 (*)    Neutro Abs 10.1 (*)    All other components within normal limits  CBC - Abnormal; Notable for the following:    RBC 2.87 (*)    Hemoglobin 8.3 (*)    HCT 27.2 (*)    RDW 16.1 (*)    All other components within normal limits  CREATININE, SERUM - Abnormal; Notable for the following:    Creatinine, Ser 3.40 (*)    GFR calc non Af Amer 18 (*)    GFR calc Af Amer 21 (*)    All other components within normal limits  GLUCOSE, CAPILLARY - Abnormal; Notable for the following:    Glucose-Capillary 127 (*)    All other components within normal limits  GLUCOSE, CAPILLARY - Abnormal; Notable for the following:    Glucose-Capillary 174 (*)    All other  components within normal limits  GLUCOSE, CAPILLARY - Abnormal; Notable for the following:    Glucose-Capillary 55 (*)    All other components within normal limits  GLUCOSE, CAPILLARY - Abnormal; Notable for the following:    Glucose-Capillary 103 (*)    All other components within normal limits  I-STAT CG4 LACTIC ACID, ED - Abnormal; Notable for the following:    Lactic Acid, Venous 2.44 (*)    All other components within normal limits  I-STAT CG4 LACTIC ACID, ED - Abnormal; Notable for the following:    Lactic Acid, Venous 2.30 (*)    All other components within normal limits  CBG MONITORING, ED - Abnormal; Notable for the following:    Glucose-Capillary 180 (*)    All other components within normal limits  CULTURE, BLOOD (ROUTINE X 2)  CULTURE, BLOOD (ROUTINE X 2)  MRSA PCR SCREENING  URINE CULTURE  BODY FLUID CULTURE  BODY FLUID CULTURE  GRAM STAIN  URINE MICROSCOPIC-ADD ON  LACTIC ACID, PLASMA  PROCALCITONIN  PROTIME-INR  AMMONIA  URIC ACID  GLUCOSE, CAPILLARY  URIC ACID  SYNOVIAL CELL COUNT + DIFF, W/ CRYSTALS  GLUCOSE, SYNOVIAL FLUID  PROTEIN, BODY FLUID  HEMOGLOBIN A1C  INFLUENZA PANEL BY PCR (TYPE A & B, H1N1)  SYNOVIAL CELL COUNT + DIFF, W/ CRYSTALS  PROTEIN, SYNOVIAL FLUID    Imaging Review Ct Abdomen Pelvis Wo Contrast  02/19/2015   CLINICAL DATA:  Generalized abdominal pain.  EXAM: CT ABDOMEN AND PELVIS WITHOUT CONTRAST  TECHNIQUE: Multidetector CT imaging of the abdomen and pelvis was performed following the standard protocol without IV contrast.  COMPARISON:  CT scan of January 17, 2015.  FINDINGS: No significant abnormality seen in the visualized lung bases. No significant osseous abnormality is noted.  No gallstones are noted. No focal abnormality is noted in the liver, spleen  or pancreas on these unenhanced images. Adrenal glands and kidneys appear normal. No hydronephrosis or renal obstruction is noted. No renal or ureteral calculi are noted. 3.1 cm  infrarenal abdominal aortic aneurysm is noted. There is no evidence of bowel obstruction. No abnormal fluid collection is noted. Urinary bladder appears normal. No significant adenopathy is noted. The appendix appears normal.  IMPRESSION: 3.1 cm infrarenal abdominal aortic aneurysm. Recommend followup by ultrasound in 3 years. This recommendation follows ACR consensus guidelines: White Paper of the ACR Incidental Findings Committee II on Vascular Findings. J Am Coll Radiol 2013; 10:789-794.  No other significant abnormality seen in the abdomen or pelvis.   Electronically Signed   By: Lupita Raider, M.D.   On: 02/19/2015 21:21   Dg Knee 1-2 Views Right  02/19/2015   CLINICAL DATA:  Acute onset of fever and sepsis. Generalized right knee pain and swelling. Initial encounter.  EXAM: RIGHT KNEE - 1-2 VIEW  COMPARISON:  Right knee radiographs performed 02/06/2015  FINDINGS: Minimal linear lucency at the superior pole of the patella may be artifactual in nature, though a fracture of the patella cannot be entirely excluded. Evaluation is suboptimal due to limitations in positioning.  The joint spaces are grossly preserved. No significant degenerative change is seen; the patellofemoral joint is grossly unremarkable in appearance. A fabella is noted.  A small to moderate knee joint effusion is noted. Scattered vascular calcifications are seen.  IMPRESSION: 1. Minimal linear lucency at the superior pole of the patella may be artifactual in nature, though a fracture of the patella cannot be entirely excluded. Would correlate for evidence of recent trauma. 2. Small to moderate knee joint effusion noted. 3. Scattered vascular calcifications seen.   Electronically Signed   By: Roanna Raider M.D.   On: 02/19/2015 20:41   Ct Head Wo Contrast  02/19/2015   CLINICAL DATA:  Acute onset of altered mental status. Encephalopathy. Initial encounter.  EXAM: CT HEAD WITHOUT CONTRAST  TECHNIQUE: Contiguous axial images were obtained  from the base of the skull through the vertex without intravenous contrast.  COMPARISON:  MRI of the brain and CT of the head performed 02/03/2015  FINDINGS: There is no evidence of acute infarction, mass lesion, or intra- or extra-axial hemorrhage on CT.  Prominence of the ventricles and sulci reflects moderate cortical volume loss. Cerebellar atrophy is noted. Diffuse periventricular and subcortical white matter change likely reflects small vessel ischemic microangiopathy. Small chronic lacunar infarcts are noted at the right basal ganglia.  The brainstem and fourth ventricle are within normal limits. The basal ganglia are unremarkable in appearance. The cerebral hemispheres demonstrate grossly normal gray-white differentiation. No mass effect or midline shift is seen.  There is no evidence of fracture; visualized osseous structures are unremarkable in appearance. The orbits are within normal limits. The paranasal sinuses and mastoid air cells are well-aerated. No significant soft tissue abnormalities are seen.  IMPRESSION: 1. No acute intracranial pathology seen on CT. 2. Moderate cortical volume loss and diffuse small vessel ischemic microangiopathy. 3. Likely small chronic lacunar infarcts at the right basal ganglia.   Electronically Signed   By: Roanna Raider M.D.   On: 02/19/2015 21:13   Dg Chest Port 1 View  02/19/2015   CLINICAL DATA:  Patient with fever and rash. History of schizophrenia.  EXAM: PORTABLE CHEST - 1 VIEW  COMPARISON:  02/06/2015  FINDINGS: Monitoring leads overlie the patient. Stable enlarged cardiac and mediastinal contours. Pulmonary vascular redistribution. Low lung volumes. Bibasilar  heterogeneous opacities. No definite pleural effusion or pneumothorax.  IMPRESSION: Low lung volumes with basilar atelectasis.  Cardiomegaly without definite evidence for acute pulmonary edema.   Electronically Signed   By: Annia Belt M.D.   On: 02/19/2015 17:16     EKG Interpretation None       MDM   Final diagnoses:  Fever, unspecified fever cause  Knee swelling, right    56M from [facility] with fever, concern for Hgb of 9. Also has a fever of 102.4. Per EMS, at baseline per nursing home. Patient here febrile, tachycardic, hypertensive. Concern at [facility] for knee pain. Patient denies any pain, will follow some basic commands. Has faint macular rash on trunk. R knee with effusion, no frank erythema. Denies pain, but will not allow me to move his knee due to pain. Sepsis protocol initiated, will obtain R knee fluid to look for possible septic arthritis. Unable to aspirate any fluid from the knee on arthrocentesis. Unable to locate exact source. Admitted for fever.     Elwin Mocha, MD 02/20/15 [phone]

## 2015-02-19 NOTE — ED Notes (Signed)
Bed: WA06 Expected date:  Expected time:  Means of arrival:  Comments: Ems, elderly rash, ab labs

## 2015-02-20 ENCOUNTER — Inpatient Hospital Stay (HOSPITAL_COMMUNITY): Payer: Medicare Other

## 2015-02-20 ENCOUNTER — Encounter (HOSPITAL_COMMUNITY): Payer: Self-pay | Admitting: *Deleted

## 2015-02-20 DIAGNOSIS — G9340 Encephalopathy, unspecified: Secondary | ICD-10-CM

## 2015-02-20 DIAGNOSIS — M10061 Idiopathic gout, right knee: Secondary | ICD-10-CM

## 2015-02-20 DIAGNOSIS — M009 Pyogenic arthritis, unspecified: Secondary | ICD-10-CM

## 2015-02-20 DIAGNOSIS — A419 Sepsis, unspecified organism: Secondary | ICD-10-CM

## 2015-02-20 LAB — INFLUENZA PANEL BY PCR (TYPE A & B)
H1N1FLUPCR: NOT DETECTED
INFLAPCR: NEGATIVE
Influenza B By PCR: NEGATIVE

## 2015-02-20 LAB — CBC WITH DIFFERENTIAL/PLATELET
Basophils Absolute: 0 10*3/uL (ref 0.0–0.1)
Basophils Relative: 0 % (ref 0–1)
EOS ABS: 0 10*3/uL (ref 0.0–0.7)
Eosinophils Relative: 0 % (ref 0–5)
HEMATOCRIT: 22.6 % — AB (ref 39.0–52.0)
Hemoglobin: 7.2 g/dL — ABNORMAL LOW (ref 13.0–17.0)
LYMPHS PCT: 6 % — AB (ref 12–46)
Lymphs Abs: 0.7 10*3/uL (ref 0.7–4.0)
MCH: 30.1 pg (ref 26.0–34.0)
MCHC: 31.9 g/dL (ref 30.0–36.0)
MCV: 94.6 fL (ref 78.0–100.0)
Monocytes Absolute: 0.8 10*3/uL (ref 0.1–1.0)
Monocytes Relative: 7 % (ref 3–12)
NEUTROS ABS: 10.1 10*3/uL — AB (ref 1.7–7.7)
Neutrophils Relative %: 87 % — ABNORMAL HIGH (ref 43–77)
PLATELETS: 235 10*3/uL (ref 150–400)
RBC: 2.39 MIL/uL — ABNORMAL LOW (ref 4.22–5.81)
RDW: 16.3 % — AB (ref 11.5–15.5)
WBC: 11.6 10*3/uL — AB (ref 4.0–10.5)

## 2015-02-20 LAB — COMPREHENSIVE METABOLIC PANEL
ALT: 14 U/L (ref 0–53)
AST: 28 U/L (ref 0–37)
Albumin: 1.6 g/dL — ABNORMAL LOW (ref 3.5–5.2)
Alkaline Phosphatase: 101 U/L (ref 39–117)
Anion gap: 11 (ref 5–15)
BILIRUBIN TOTAL: 0.7 mg/dL (ref 0.3–1.2)
BUN: 39 mg/dL — ABNORMAL HIGH (ref 6–23)
CHLORIDE: 104 mmol/L (ref 96–112)
CO2: 20 mmol/L (ref 19–32)
Calcium: 7.9 mg/dL — ABNORMAL LOW (ref 8.4–10.5)
Creatinine, Ser: 3.23 mg/dL — ABNORMAL HIGH (ref 0.50–1.35)
GFR calc non Af Amer: 19 mL/min — ABNORMAL LOW (ref >90)
GFR, EST AFRICAN AMERICAN: 22 mL/min — AB (ref >90)
Glucose, Bld: 131 mg/dL — ABNORMAL HIGH (ref 70–99)
POTASSIUM: 3.9 mmol/L (ref 3.5–5.1)
Sodium: 135 mmol/L (ref 135–145)
Total Protein: 5.6 g/dL — ABNORMAL LOW (ref 6.0–8.3)

## 2015-02-20 LAB — GRAM STAIN

## 2015-02-20 LAB — GLUCOSE, CAPILLARY
GLUCOSE-CAPILLARY: 174 mg/dL — AB (ref 70–99)
GLUCOSE-CAPILLARY: 55 mg/dL — AB (ref 70–99)
GLUCOSE-CAPILLARY: 93 mg/dL (ref 70–99)
Glucose-Capillary: 127 mg/dL — ABNORMAL HIGH (ref 70–99)
Glucose-Capillary: 103 mg/dL — ABNORMAL HIGH (ref 70–99)

## 2015-02-20 LAB — SYNOVIAL CELL COUNT + DIFF, W/ CRYSTALS
LYMPHOCYTES-SYNOVIAL FLD: 2 % (ref 0–20)
Monocyte-Macrophage-Synovial Fluid: 7 % — ABNORMAL LOW (ref 50–90)
NEUTROPHIL, SYNOVIAL: 91 % — AB (ref 0–25)
WBC, Synovial: 35670 /mm3 — ABNORMAL HIGH (ref 0–200)

## 2015-02-20 LAB — PREPARE RBC (CROSSMATCH)

## 2015-02-20 LAB — URIC ACID: Uric Acid, Serum: 7.3 mg/dL (ref 4.0–7.8)

## 2015-02-20 LAB — LACTIC ACID, PLASMA: LACTIC ACID, VENOUS: 1.5 mmol/L (ref 0.5–2.0)

## 2015-02-20 MED ORDER — VANCOMYCIN HCL IN DEXTROSE 750-5 MG/150ML-% IV SOLN
750.0000 mg | INTRAVENOUS | Status: DC
  Administered 2015-02-21: 750 mg via INTRAVENOUS
  Filled 2015-02-20: qty 150

## 2015-02-20 MED ORDER — SODIUM CHLORIDE 0.9 % IV SOLN
Freq: Once | INTRAVENOUS | Status: AC
  Administered 2015-02-20: 21:00:00 via INTRAVENOUS

## 2015-02-20 MED ORDER — OSELTAMIVIR PHOSPHATE 30 MG PO CAPS
30.0000 mg | ORAL_CAPSULE | Freq: Every day | ORAL | Status: DC
  Administered 2015-02-21: 30 mg via ORAL
  Filled 2015-02-20 (×2): qty 1

## 2015-02-20 MED ORDER — DEXTROSE 50 % IV SOLN
INTRAVENOUS | Status: AC
  Administered 2015-02-20: 50 mL
  Filled 2015-02-20: qty 50

## 2015-02-20 MED ORDER — DEXTROSE-NACL 5-0.9 % IV SOLN
INTRAVENOUS | Status: DC
  Administered 2015-02-20 – 2015-02-21 (×2): via INTRAVENOUS

## 2015-02-20 NOTE — Progress Notes (Signed)
CARE MANAGEMENT NOTE 02/20/2015  Patient:  [name] Wise,[name] Wise   Account Number:  [id number]  Date Initiated:  02/20/2015  Documentation initiated by:  Nianna Igo  Subjective/Objective Assessment:   Sepsis/Septic joint versus acute gout/Stage IV chronic kidney disease/Rash/Insulin-dependent diabetes mellitus     Action/Plan:   [facility]     Anticipated DC Date:  02/23/2015   Anticipated DC Plan:  SKILLED NURSING FACILITY  In-house referral  Clinical Social Worker      DC Planning Services  CM consult      [facility]  [facility]   Choice offered to / List presented to:  NA           Status of service:  In process, will continue to follow Medicare Important Message given?   (If response is "NO", the following Medicare IM given date fields will be blank) Date Medicare IM given:   Medicare IM given by:   Date Additional Medicare IM given:   Additional Medicare IM given by:    Discharge Disposition:    Per UR Regulation:  Reviewed for med. necessity/level of care/duration of stay  If discussed at Long Length of Stay Meetings, dates discussed:    Comments:  February 20, 2015/Chaston Bradburn L. Earlene Plateravis, RN, BSN, CCM. Case Management [employer] [phone] No discharge needs present of time of review.

## 2015-02-20 NOTE — Progress Notes (Signed)
02/20/15 1722  Called Ms Daphine DeutscherWakeman, HCPOA for patient. She gave verbal consent by phone to have the right knee washed out. [facility] PontMurphy explained the procedure and the risk to her. [facility] in [facility] morning.

## 2015-02-20 NOTE — Progress Notes (Signed)
ANTIBIOTIC CONSULT NOTE - FOLLOW UP  Pharmacy Consult for vancomycin and zosyn Indication: suspected septic arthritis  No Known Allergies  Patient Measurements: Height:  (160 cm) Weight: 122 lb 12.7 oz (55.7 kg) IBW/kg (Calculated) : 56.9  Vital Signs: Temp: 98.4 F (36.9 C) (03/18 0800) Temp Source: Oral (03/18 0800) BP: 103/37 mmHg (03/18 0900) Pulse Rate: 82 (03/18 0900) Intake/Output from previous day: 03/17 0701 - 03/18 0700 In: 1025 [I.V.:975; IV Piggyback:50] Out: 400 [Urine:400] Intake/Output from this shift:    Labs:  Recent Labs  02/19/15 1617 02/19/15 1720 02/19/15 2059 02/20/15 0345  WBC 13.5*  --  10.3 11.6*  HGB 8.9*  --  8.3* 7.2*  PLT 313  --  262 235  CREATININE  --  3.39* 3.40* 3.23*   Estimated Creatinine Clearance: 18.4 mL/min (by C-G formula based on Cr of 3.23). No results for input(s): VANCOTROUGH, VANCOPEAK, VANCORANDOM, GENTTROUGH, GENTPEAK, GENTRANDOM, TOBRATROUGH, TOBRAPEAK, TOBRARND, AMIKACINPEAK, AMIKACINTROU, AMIKACIN in the last 72 hours.   Microbiology: Recent Results (from the past 720 hour(s))  Culture, Urine     Status: None   Collection Time: 02/03/15  9:00 PM  Result Value Ref Range Status   Specimen Description URINE, RANDOM  Final   Special Requests NONE  Final   Colony Count NO GROWTH Performed at [facility]   Final   Culture NO GROWTH Performed at [facility]   Final   Report Status 02/05/2015 FINAL  Final  MRSA PCR Screening     Status: None   Collection Time: 02/04/15 12:34 AM  Result Value Ref Range Status   MRSA by PCR NEGATIVE NEGATIVE Final    Comment:        The GeneXpert MRSA Assay (FDA approved for NASAL specimens only), is one component of a comprehensive MRSA colonization surveillance program. It is not intended to diagnose MRSA infection nor to guide or monitor treatment for MRSA infections.   Culture, blood (routine x 2)     Status: None   Collection Time: 02/05/15   5:15 AM  Result Value Ref Range Status   Specimen Description BLOOD RIGHT FOREARM  Final   Special Requests BOTTLES DRAWN AEROBIC ONLY  Final   Culture   Final    STAPHYLOCOCCUS SPECIES (COAGULASE NEGATIVE) Note: THE SIGNIFICANCE OF ISOLATING THIS ORGANISM FROM A SINGLE SET OF BLOOD CULTURES WHEN MULTIPLE SETS ARE DRAWN IS UNCERTAIN. PLEASE NOTIFY THE MICROBIOLOGY DEPARTMENT WITHIN ONE WEEK IF SPECIATION AND SENSITIVITIES ARE REQUIRED. Note: Gram Stain Report Called to,Read Back By and Verified With: LIRA VERDELDIDIOS AT 4:32 A.M. ON 02/06/2015 WARRB Performed at [facility]    Report Status 02/07/2015 FINAL  Final  Culture, blood (routine x 2)     Status: None   Collection Time: 02/05/15  5:17 AM  Result Value Ref Range Status   Specimen Description BLOOD RIGHT HAND  Final   Special Requests BOTTLES DRAWN AEROBIC ONLY  Final   Culture   Final    NO GROWTH 5 DAYS Performed at [facility]    Report Status 02/11/2015 FINAL  Final  Culture, Urine     Status: None   Collection Time: 02/05/15  4:42 PM  Result Value Ref Range Status   Specimen Description URINE, CLEAN CATCH  Final   Special Requests NONE  Final   Colony Count   Final    >=100,000 COLONIES/ML Performed at [facility]   Final  Multiple bacterial morphotypes present, none predominant. Suggest appropriate recollection if clinically indicated. [facility]formed at [facility]    Report Status 02/06/2015 FINAL  Final  Clostridium Difficile by PCR     Status: None   Collection Time: 02/08/15  5:07 AM  Result Value Ref Range Status   C difficile by pcr NEGATIVE NEGATIVE Final  Blood Culture (routine x 2)     Status: None (Preliminary result)   Collection Time: 02/19/15  4:17 PM  Result Value Ref Range Status   Specimen Description BLOOD  Final   Special Requests NONE  Final   Culture   Final           BLOOD CULTURE RECEIVED NO GROWTH TO DATE CULTURE WILL BE HELD FOR 5 DAYS  BEFORE ISSUING A FINAL NEGATIVE REPORT [facility]formed at [facility]    Report Status PENDING  Incomplete  Blood Culture (routine x 2)     Status: None (Preliminary result)   Collection Time: 02/19/15  4:22 PM  Result Value Ref Range Status   Specimen Description BLOOD RIGHT HAND  Final   Special Requests BOTTLES DRAWN AEROBIC AND ANAEROBIC 4CC  Final   Culture   Final           BLOOD CULTURE RECEIVED NO GROWTH TO DATE CULTURE WILL BE HELD FOR 5 DAYS BEFORE ISSUING A FINAL NEGATIVE REPORT Performed at [facility]    Report Status PENDING  Incomplete  MRSA PCR Screening     Status: None   Collection Time: 02/19/15  9:45 PM  Result Value Ref Range Status   MRSA by PCR NEGATIVE NEGATIVE Final    Comment:        The GeneXpert MRSA Assay (FDA approved for NASAL specimens only), is one component of a comprehensive MRSA colonization surveillance program. It is not intended to diagnose MRSA infection nor to guide or monitor treatment for MRSA infections.     Anti-infectives    Start     Dose/Rate Route Frequency Ordered Stop   02/20/15 2200  oseltamivir (TAMIFLU) capsule 30 mg     30 mg Oral Daily at bedtime 02/20/15 0751 02/24/15 2159   02/20/15 1800  vancomycin (VANCOCIN) 500 mg in sodium chloride 0.9 % 100 mL IVPB     500 mg 100 mL/hr over 60 Minutes Intravenous Every 24 hours 02/19/15 2005     02/20/15 0000  piperacillin-tazobactam (ZOSYN) IVPB 2.25 g     2.25 g 100 mL/hr over 30 Minutes Intravenous Every 8 hours 02/19/15 2005     02/19/15 2100  oseltamivir (TAMIFLU) capsule 75 mg  Status:  Discontinued     75 mg Oral 2 times daily 02/19/15 2041 02/20/15 0751   02/19/15 1945  piperacillin-tazobactam (ZOSYN) IVPB 3.375 g  Status:  Discontinued     3.375 g 100 mL/hr over 30 Minutes Intravenous  Once 02/19/15 1944 02/19/15 1953   02/19/15 1945  vancomycin (VANCOCIN) IVPB 1000 mg/200 mL premix  Status:  Discontinued     1,000 mg 200 mL/hr over 60 Minutes  Intravenous  Once 02/19/15 1944 02/19/15 1953   02/19/15 1630  vancomycin (VANCOCIN) IVPB 1000 mg/200 mL premix     1,000 mg 200 mL/hr over 60 Minutes Intravenous  Once 02/19/15 1618 02/19/15 1739   02/19/15 1630  piperacillin-tazobactam (ZOSYN) IVPB 3.375 g     3.375 g 100 mL/hr over 30 Minutes Intravenous  Once 02/19/15 1618 02/19/15 1709      Assessment: 64 yo male with  CKD stage IV from [facility] c/o rash, knee swelling and fever 102.3. Pharmacy is consulted to dose vancomycin and zosyn for possible septic arthritis. Plan for possible R knee aspiration.  3/17 >> Vanc >> 3/17 >> Zosyn >> 3/18 >> tamiflu >> 3/22  Tmax: 103 WBC: 11.6 CKD IV: SCr down 3.23, CrCl 18CG Lactate: 2.44>> 2.5 PCT 3.72  3/17 blood x2: ngtd 3/17 urine: sent 3/17 R knee fluid: sent 3/17 influ PCR:  Goal of Therapy:  Vancomycin trough level 15-20 mcg/ml  Plan:  - with renal insufficiency, will adjust vancomycin to  q48h. - continue zosyn 2.25 gm IV q8h - f/u renal funct  Monti Villers P 02/20/2015,10:47 AM

## 2015-02-20 NOTE — Progress Notes (Signed)
[facility] PROGRESS NOTE  [name] Wise ZOX:[id number] DOB: Feb 22, 1951 DOA: 02/19/2015 PCP: Ralene Ok, MD  Assessment/Plan: 1. Sepsis -Present on admission, evidenced by temperature of 103, heart rate 132, respiratory rate of 30, lactic acid of 2.5. -Workup thus far has shown a negative chest x-ray and urinalysis. -He presents with a painful right knee, appearing swollen with localized erythema on physical examination. -I am concerned about septic joint representing source of infection and will consult interventional radiology for an ultrasound-guided joint tap. Plan to send synovial fluid for Gram stain, cell count, culture, crystal analysis. -Meanwhile will continue empiric IV antibiotic therapy with Zosyn and vancomycin.  2.  Septic joint versus acute gout -Patient presenting with severe right knee pain. Although he has a history of gout and has been treated multiple times in the past for acute gout, he is septic on presentation without another obvious source of infection. On physical exam his right knee was inflamed having significant tenderness. -As outlined above, since arthrocentesis was unsuccessful in emergency room, will consult interventional radiology for an ultrasound-guided joint tap.  -Will send synovial fluid for Gram stain, culture, cell count, crystal analysis. -Follow-up on blood cultures.  3.  Stage IV chronic kidney disease. -Patient having baseline creatinine between 3.2 and 3.4 -Lab work this morning showing creatinine of 3.23, will continue to monitor.  4.   Acute encephalopathy. -Patient noted and have mental status changes at a skilled nursing facility, I suspect related to delirium precipitated by infectious process.  5.  Rash. -Patient having maculopapular rash on trunk of abdomen, unclear etiology. -I doubt this was antibiotic related as IV antibiotic therapy was started after development of rash.  6.  Insulin-dependent diabetes mellitus -Continue  Lantus 10 units subcutaneous daily at bedtime along with sliding scale coverage.  Code Status: Full code Family Communication: Family not available Disposition Plan: Anticipate discharge back to skilled nursing facility when medically stable   Consultants:  Orthopedic surgery  Procedures:  Pending arthrocentesis  Antibiotics:  Vancomycin  Zosyn  HPI/Subjective: Patient is a pleasant 64 year old gentleman, currently a resident at skilled nursing facility, presenting to the emergency department having a temperature of 102.3, painful right knee, and rash over trunk of body. In the emergency department patient meeting sepsis criteria, having a temperature of 103, heart rate of 132, respiratory rate of 30. Workup included a chest x-ray which showed low lung volumes with bibasilar atelectasis. He had a right knee x-ray that showed moderate effusion. There was concerns that this may represent source of infection for which arthrocentesis was attempted in the emergency department, however, unfortunately this was unsuccessful. Orthopedic surgery was consulted. Patient was started on empiric IV antibiotic therapy with Zosyn and vancomycin.  Objective: Filed Vitals:   02/20/15 0800  BP:   Pulse:   Temp: 98.4 F (36.9 C)  Resp:     Intake/Output Summary (Last 24 hours) at 02/20/15 8119 Last data filed at 02/20/15 1478  Gross per 24 hour  Intake   1025 ml  Output    400 ml  Net    625 ml   Filed Weights   02/19/15 2215 02/20/15 0500  Weight: 57.7 kg (127 lb 3.3 oz) 55.7 kg (122 lb 12.7 oz)    Exam:   General:  Patient is awake and alert, can follow simple commands  Cardiovascular: Regular rate and rhythm, tachycardic normal S1-S2  Respiratory: Normal respiratory effort, lungs are clear to auscultation bilaterally no wheezing rhonchi or rales  Abdomen: Soft nontender nondistended  Musculoskeletal:  There is erythema and localized swelling involving the right knee. Patient  having significant pain with movement to his knee  Skin: Macular papular rash involving trunk  Data Reviewed: Basic Metabolic Panel:  Recent Labs Lab 02/19/15 1720 02/19/15 2059 02/20/15 0345  NA 135  --  135  K 4.7  --  3.9  CL 98  --  104  CO2 23  --  20  GLUCOSE 152*  --  131*  BUN 46*  --  39*  CREATININE 3.39* 3.40* 3.23*  CALCIUM 8.6  --  7.9*   Liver Function Tests:  Recent Labs Lab 02/19/15 1720 02/20/15 0345  AST 28 28  ALT 18 14  ALKPHOS 121* 101  BILITOT 0.5 0.7  PROT 6.8 5.6*  ALBUMIN 2.0* 1.6*   No results for input(s): LIPASE, AMYLASE in the last 168 hours.  Recent Labs Lab 02/19/15 2059  AMMONIA 26   CBC:  Recent Labs Lab 02/19/15 1617 02/19/15 2059 02/20/15 0345  WBC 13.5* 10.3 11.6*  NEUTROABS 11.4*  --  10.1*  HGB 8.9* 8.3* 7.2*  HCT 29.0* 27.2* 22.6*  MCV 94.8 94.8 94.6  PLT 313 262 235   Cardiac Enzymes: No results for input(s): CKTOTAL, CKMB, CKMBINDEX, TROPONINI in the last 168 hours. BNP (last 3 results)  Recent Labs  02/06/15 0820  BNP 1559.6*    ProBNP (last 3 results) No results for input(s): PROBNP in the last 8760 hours.  CBG:  Recent Labs Lab 02/19/15 2009 02/20/15 0028 02/20/15 0353 02/20/15 0810  GLUCAP 180* 93 127* 174*    Recent Results (from the past 240 hour(s))  MRSA PCR Screening     Status: None   Collection Time: 02/19/15  9:45 PM  Result Value Ref Range Status   MRSA by PCR NEGATIVE NEGATIVE Final    Comment:        The GeneXpert MRSA Assay (FDA approved for NASAL specimens only), is one component of a comprehensive MRSA colonization surveillance program. It is not intended to diagnose MRSA infection nor to guide or monitor treatment for MRSA infections.      Studies: Ct Abdomen Pelvis Wo Contrast  02/19/2015   CLINICAL DATA:  Generalized abdominal pain.  EXAM: CT ABDOMEN AND PELVIS WITHOUT CONTRAST  TECHNIQUE: Multidetector CT imaging of the abdomen and pelvis was performed  following the standard protocol without IV contrast.  COMPARISON:  CT scan of January 17, 2015.  FINDINGS: No significant abnormality seen in the visualized lung bases. No significant osseous abnormality is noted.  No gallstones are noted. No focal abnormality is noted in the liver, spleen or pancreas on these unenhanced images. Adrenal glands and kidneys appear normal. No hydronephrosis or renal obstruction is noted. No renal or ureteral calculi are noted. 3.1 cm infrarenal abdominal aortic aneurysm is noted. There is no evidence of bowel obstruction. No abnormal fluid collection is noted. Urinary bladder appears normal. No significant adenopathy is noted. The appendix appears normal.  IMPRESSION: 3.1 cm infrarenal abdominal aortic aneurysm. Recommend followup by ultrasound in 3 years. This recommendation follows ACR consensus guidelines: White Paper of the ACR Incidental Findings Committee II on Vascular Findings. J Am Coll Radiol 2013; 10:789-794.  No other significant abnormality seen in the abdomen or pelvis.   Electronically Signed   By: Lupita RaiderJames  Green Jr, M.D.   On: 02/19/2015 21:21   Dg Knee 1-2 Views Right  02/19/2015   CLINICAL DATA:  Acute onset of fever and sepsis. Generalized right knee pain and swelling.  Initial encounter.  EXAM: RIGHT KNEE - 1-2 VIEW  COMPARISON:  Right knee radiographs performed 02/06/2015  FINDINGS: Minimal linear lucency at the superior pole of the patella may be artifactual in nature, though a fracture of the patella cannot be entirely excluded. Evaluation is suboptimal due to limitations in positioning.  The joint spaces are grossly preserved. No significant degenerative change is seen; the patellofemoral joint is grossly unremarkable in appearance. A fabella is noted.  A small to moderate knee joint effusion is noted. Scattered vascular calcifications are seen.  IMPRESSION: 1. Minimal linear lucency at the superior pole of the patella may be artifactual in nature, though a  fracture of the patella cannot be entirely excluded. Would correlate for evidence of recent trauma. 2. Small to moderate knee joint effusion noted. 3. Scattered vascular calcifications seen.   Electronically Signed   By: Roanna Raider M.D.   On: 02/19/2015 20:41   Ct Head Wo Contrast  02/19/2015   CLINICAL DATA:  Acute onset of altered mental status. Encephalopathy. Initial encounter.  EXAM: CT HEAD WITHOUT CONTRAST  TECHNIQUE: Contiguous axial images were obtained from the base of the skull through the vertex without intravenous contrast.  COMPARISON:  MRI of the brain and CT of the head performed 02/03/2015  FINDINGS: There is no evidence of acute infarction, mass lesion, or intra- or extra-axial hemorrhage on CT.  Prominence of the ventricles and sulci reflects moderate cortical volume loss. Cerebellar atrophy is noted. Diffuse periventricular and subcortical white matter change likely reflects small vessel ischemic microangiopathy. Small chronic lacunar infarcts are noted at the right basal ganglia.  The brainstem and fourth ventricle are within normal limits. The basal ganglia are unremarkable in appearance. The cerebral hemispheres demonstrate grossly normal gray-white differentiation. No mass effect or midline shift is seen.  There is no evidence of fracture; visualized osseous structures are unremarkable in appearance. The orbits are within normal limits. The paranasal sinuses and mastoid air cells are well-aerated. No significant soft tissue abnormalities are seen.  IMPRESSION: 1. No acute intracranial pathology seen on CT. 2. Moderate cortical volume loss and diffuse small vessel ischemic microangiopathy. 3. Likely small chronic lacunar infarcts at the right basal ganglia.   Electronically Signed   By: Roanna Raider M.D.   On: 02/19/2015 21:13   Dg Chest Port 1 View  02/19/2015   CLINICAL DATA:  Patient with fever and rash. History of schizophrenia.  EXAM: PORTABLE CHEST - 1 VIEW  COMPARISON:   02/06/2015  FINDINGS: Monitoring leads overlie the patient. Stable enlarged cardiac and mediastinal contours. Pulmonary vascular redistribution. Low lung volumes. Bibasilar heterogeneous opacities. No definite pleural effusion or pneumothorax.  IMPRESSION: Low lung volumes with basilar atelectasis.  Cardiomegaly without definite evidence for acute pulmonary edema.   Electronically Signed   By: Annia Belt M.D.   On: 02/19/2015 17:16    Scheduled Meds: . allopurinol  200 mg Oral Daily  . benztropine  1 mg Oral Daily  . heparin  5,000 Units Subcutaneous 3 times per day  . insulin aspart  0-9 Units Subcutaneous 6 times per day  . insulin glargine  10 Units Subcutaneous QHS  . metoprolol tartrate  25 mg Oral BID  . oseltamivir  30 mg Oral QHS  . piperacillin-tazobactam (ZOSYN)  IV  2.25 g Intravenous Q8H  . sodium bicarbonate  1,300 mg Oral BID  . sodium chloride  3 mL Intravenous Q12H  . vancomycin  500 mg Intravenous Q24H   Continuous Infusions: . dextrose  5 % and 0.9% NaCl 75 mL/hr at 02/20/15 0737    Active Problems:   Sepsis    Time spent: 40 minutes    Jeralyn Bennett  [facility] Pager [phone]. If 7PM-7AM, please contact night-coverage at [email or web address], password [facility] 02/20/2015, 8:21 AM  LOS: 1 day

## 2015-02-20 NOTE — Progress Notes (Signed)
I was called and informed that 20cc of puss was aspirated from his R knee.   [facility] and he will undergo an urgent wash out arthroscopically at 7:30am Saturday.   Again please transfer to [facility] tonight.  I have spoken to his POA   Mauricia Mertens D

## 2015-02-20 NOTE — Progress Notes (Signed)
Follow up note  I was informed by Radiology that arthrocentesis yielded approx 20 cc of frank pus, highly suggestive of septic joint. I spoke with Dr Eulah PontMurphy of orthopedic surgery who [facility] where he will likely be taken to OR in am for wash out. Case was discussed with Dr Izola PriceMyers who has [facility]. Labs showing Hg of 7.2, will type and cross and transfuse 2 units of PRBC's.

## 2015-02-20 NOTE — Clinical Social Work Psychosocial (Signed)
  BRIEF PSYCHOSOCIAL ASSESSMENT 02/20/2015 Clinical Social Work Department   Patient:  Wise,[name]     Account Number:  [id number]     Admit date:  02/19/2015  Clinical Social Worker:  Robin SearingALDWELL,Tashiba Timoney, LCSWA  Date/Time:  02/20/2015 03:56 PM  Referred by:  Physician  Date Referred:  02/20/2015 Referred for  SNF Placement   Other Referral:   Interview type:  Other - See comment Other interview type:   Spoke with [name] Wise who inidcates she has known him since 1999. She has his HCPOA    PSYCHOSOCIAL DATA Living Status:  FACILITY Admitted from facility:  [facility] Level of care:  Skilled Nursing Facility Primary support name:  Mary Primary support relationship to patient:  FRIEND Degree of support available:   good    CURRENT CONCERNS Current Concerns  Post-Acute Placement   Other Concerns:    SOCIAL WORK ASSESSMENT / PLAN CSW contacted patient's HCPOA, [name] Wise,  who inidcates she has known him since 1999. She has his HCPOA  and states he has been at [facility] since February but has been "in and out of the hospital". She reports prior to that, he was living with his landlord and was independent and "drove hiimself monthly to mental health to get his Haldol shot".  She is hoping he will progress and be able to transition to ALF but is looking into LTC options in case he needs to stay at SNF level long term.   Assessment/plan status:  Other - See comment Other assessment/ plan:   FL2 updated for SNF return   Information/referral to community resources:    PATIENT'S/FAMILY'S RESPONSE TO PLAN OF CARE: Pateint's HCPOA is concerned that he has not had his monthly Haldol shot since January and is wanting to [facility]- "he needs that shot every month". She also shared with CSW that he has some family but not involved with all of his children- she did not elaborate but she is HCPOA and primary contact.       Reece LevyJanet Nicoya Friel, MSW,  Theresia MajorsLCSWA [phone]

## 2015-02-20 NOTE — Progress Notes (Signed)
02/20/15 1610  Spoke with Ms Terrace ArabiaWakeman who states she is the patients HCPOA. She gives consent for patient to have aspiration of the right knee. She also states he is from [place] and speaks [place].

## 2015-02-21 ENCOUNTER — Inpatient Hospital Stay (HOSPITAL_COMMUNITY): Payer: Medicare Other | Admitting: Certified Registered Nurse Anesthetist

## 2015-02-21 ENCOUNTER — Encounter (HOSPITAL_COMMUNITY): Payer: Self-pay | Admitting: *Deleted

## 2015-02-21 ENCOUNTER — Encounter (HOSPITAL_COMMUNITY)
Admission: EM | Disposition: A | Discharge status: Skilled Nursing Facility | Payer: Self-pay | Source: Home / Self Care | Attending: Internal Medicine

## 2015-02-21 DIAGNOSIS — M109 Gout, unspecified: Secondary | ICD-10-CM

## 2015-02-21 DIAGNOSIS — M1009 Idiopathic gout, multiple sites: Secondary | ICD-10-CM

## 2015-02-21 DIAGNOSIS — R6510 Systemic inflammatory response syndrome (SIRS) of non-infectious origin without acute organ dysfunction: Secondary | ICD-10-CM

## 2015-02-21 HISTORY — PX: KNEE ARTHROSCOPY: SHX127

## 2015-02-21 LAB — GLUCOSE, CAPILLARY
GLUCOSE-CAPILLARY: 147 mg/dL — AB (ref 70–99)
GLUCOSE-CAPILLARY: 175 mg/dL — AB (ref 70–99)
Glucose-Capillary: 130 mg/dL — ABNORMAL HIGH (ref 70–99)
Glucose-Capillary: 124 mg/dL — ABNORMAL HIGH (ref 70–99)
Glucose-Capillary: 130 mg/dL — ABNORMAL HIGH (ref 70–99)
Glucose-Capillary: 123 mg/dL — ABNORMAL HIGH (ref 70–99)
Glucose-Capillary: 102 mg/dL — ABNORMAL HIGH (ref 70–99)
Glucose-Capillary: 134 mg/dL — ABNORMAL HIGH (ref 70–99)

## 2015-02-21 LAB — TYPE AND SCREEN
ABO/RH(D): O POS
Antibody Screen: NEGATIVE
UNIT DIVISION: 0
Unit division: 0

## 2015-02-21 LAB — SURGICAL PCR SCREEN
MRSA, PCR: NEGATIVE
Staphylococcus aureus: POSITIVE — AB

## 2015-02-21 LAB — URINE CULTURE
Colony Count: NO GROWTH
Culture: NO GROWTH

## 2015-02-21 LAB — HEMOGLOBIN A1C
Hgb A1c MFr Bld: 7.2 % — ABNORMAL HIGH (ref 4.8–5.6)
Mean Plasma Glucose: 160 mg/dL

## 2015-02-21 SURGERY — ARTHROSCOPY, KNEE
Anesthesia: General | Site: Knee | Laterality: Right

## 2015-02-21 MED ORDER — SODIUM CHLORIDE 0.9 % IV SOLN
INTRAVENOUS | Status: DC
  Administered 2015-02-21: 07:00:00 via INTRAVENOUS

## 2015-02-21 MED ORDER — METHYLPREDNISOLONE SODIUM SUCC 125 MG IJ SOLR
60.0000 mg | Freq: Every day | INTRAMUSCULAR | Status: DC
  Administered 2015-02-21 – 2015-02-22 (×2): 60 mg via INTRAVENOUS
  Filled 2015-02-21 (×2): qty 0.96

## 2015-02-21 MED ORDER — ACETAMINOPHEN 500 MG PO TABS
1000.0000 mg | ORAL_TABLET | Freq: Once | ORAL | Status: AC
  Administered 2015-02-21: 1000 mg via ORAL
  Filled 2015-02-21: qty 2

## 2015-02-21 MED ORDER — OXYCODONE HCL 5 MG PO TABS: 5.0000 mg | ORAL_TABLET | Freq: Once | ORAL | Status: DC | PRN

## 2015-02-21 MED ORDER — METHYLPREDNISOLONE ACETATE 80 MG/ML IJ SUSP
INTRAMUSCULAR | Status: AC
  Filled 2015-02-21: qty 1

## 2015-02-21 MED ORDER — PROPOFOL 10 MG/ML IV BOLUS
INTRAVENOUS | Status: AC
  Filled 2015-02-21: qty 20

## 2015-02-21 MED ORDER — SODIUM CHLORIDE 0.9 % IV SOLN
INTRAVENOUS | Status: DC | PRN
  Administered 2015-02-21 (×2): via INTRAVENOUS

## 2015-02-21 MED ORDER — BUPIVACAINE HCL (PF) 0.25 % IJ SOLN
INTRAMUSCULAR | Status: DC | PRN
  Administered 2015-02-21: 20 mL via INTRA_ARTICULAR

## 2015-02-21 MED ORDER — ROCURONIUM BROMIDE 50 MG/5ML IV SOLN
INTRAVENOUS | Status: AC
  Filled 2015-02-21: qty 1

## 2015-02-21 MED ORDER — ONDANSETRON HCL 4 MG/2ML IJ SOLN: 4.0000 mg | Freq: Four times a day (QID) | INTRAMUSCULAR | Status: DC | PRN

## 2015-02-21 MED ORDER — SODIUM CHLORIDE 0.9 % IR SOLN
Status: DC | PRN
  Administered 2015-02-21 (×3): 3000 mL

## 2015-02-21 MED ORDER — METHYLPREDNISOLONE ACETATE 80 MG/ML IJ SUSP
INTRAMUSCULAR | Status: DC | PRN
  Administered 2015-02-21: 80 mg via INTRA_ARTICULAR

## 2015-02-21 MED ORDER — PHENYLEPHRINE HCL 10 MG/ML IJ SOLN
INTRAMUSCULAR | Status: DC | PRN
  Administered 2015-02-21 (×2): 80 ug via INTRAVENOUS

## 2015-02-21 MED ORDER — LIDOCAINE HCL (CARDIAC) 20 MG/ML IV SOLN
INTRAVENOUS | Status: AC
  Filled 2015-02-21: qty 5

## 2015-02-21 MED ORDER — OXYCODONE HCL 5 MG/5ML PO SOLN: 5.0000 mg | Freq: Once | ORAL | Status: DC | PRN

## 2015-02-21 MED ORDER — LIDOCAINE HCL (CARDIAC) 20 MG/ML IV SOLN
INTRAVENOUS | Status: DC | PRN
  Administered 2015-02-21: 50 mg via INTRAVENOUS

## 2015-02-21 MED ORDER — HYDROMORPHONE HCL 1 MG/ML IJ SOLN: 0.2500 mg | INTRAMUSCULAR | Status: DC | PRN

## 2015-02-21 MED ORDER — PROPOFOL 10 MG/ML IV BOLUS
INTRAVENOUS | Status: DC | PRN
  Administered 2015-02-21: 150 mg via INTRAVENOUS

## 2015-02-21 MED ORDER — EPHEDRINE SULFATE 50 MG/ML IJ SOLN
INTRAMUSCULAR | Status: DC | PRN
  Administered 2015-02-21 (×2): 10 mg via INTRAVENOUS

## 2015-02-21 MED ORDER — DEXTROSE-NACL 5-0.45 % IV SOLN
100.0000 mL/h | INTRAVENOUS | Status: DC
  Administered 2015-02-21: 100 mL/h via INTRAVENOUS

## 2015-02-21 MED ORDER — SUCCINYLCHOLINE CHLORIDE 20 MG/ML IJ SOLN
INTRAMUSCULAR | Status: AC
  Filled 2015-02-21: qty 1

## 2015-02-21 MED ORDER — BUPIVACAINE HCL (PF) 0.25 % IJ SOLN
INTRAMUSCULAR | Status: AC
  Filled 2015-02-21: qty 30

## 2015-02-21 MED ORDER — PHENYLEPHRINE 40 MCG/ML (10ML) SYRINGE FOR IV PUSH (FOR BLOOD PRESSURE SUPPORT)
PREFILLED_SYRINGE | INTRAVENOUS | Status: AC
  Filled 2015-02-21: qty 10

## 2015-02-21 MED ORDER — FENTANYL CITRATE 0.05 MG/ML IJ SOLN
INTRAMUSCULAR | Status: AC
  Filled 2015-02-21: qty 5

## 2015-02-21 MED ORDER — FENTANYL CITRATE 0.05 MG/ML IJ SOLN
INTRAMUSCULAR | Status: DC | PRN
Start: 2015-02-21 — End: 2015-02-21
  Administered 2015-02-21 (×3): 50 ug via INTRAVENOUS

## 2015-02-21 MED ORDER — MIDAZOLAM HCL 2 MG/2ML IJ SOLN
INTRAMUSCULAR | Status: AC
  Filled 2015-02-21: qty 2

## 2015-02-21 SURGICAL SUPPLY — 55 items
BANDAGE ELASTIC 6 VELCRO ST LF (GAUZE/BANDAGES/DRESSINGS) ×3 IMPLANT
BANDAGE ESMARK 6X9 LF (GAUZE/BANDAGES/DRESSINGS) IMPLANT
BLADE CUTTER GATOR 3.5 (BLADE) ×3 IMPLANT
BLADE GREAT WHITE 4.2 (BLADE) ×2 IMPLANT
BLADE GREAT WHITE 4.2MM (BLADE) ×1
BLADE SURG 11 STRL SS (BLADE) IMPLANT
BLADE SURG ROTATE 9660 (MISCELLANEOUS) IMPLANT
BNDG ESMARK 6X9 LF (GAUZE/BANDAGES/DRESSINGS)
BOOTCOVER CLEANROOM LRG (PROTECTIVE WEAR) ×6 IMPLANT
COVER SURGICAL LIGHT HANDLE (MISCELLANEOUS) ×3 IMPLANT
CUFF TOURNIQUET SINGLE 34IN LL (TOURNIQUET CUFF) IMPLANT
CUTTER MENISCUS 3.5MM 6/BX (BLADE) ×3 IMPLANT
DRAPE ARTHROSCOPY W/POUCH 114 (DRAPES) ×3 IMPLANT
DRAPE U-SHAPE 47X51 STRL (DRAPES) ×3 IMPLANT
DRSG PAD ABDOMINAL 8X10 ST (GAUZE/BANDAGES/DRESSINGS) ×3 IMPLANT
FACESHIELD WRAPAROUND (MASK) ×3 IMPLANT
GAUZE SPONGE 4X4 12PLY STRL (GAUZE/BANDAGES/DRESSINGS) IMPLANT
GAUZE XEROFORM 1X8 LF (GAUZE/BANDAGES/DRESSINGS) ×3 IMPLANT
GLOVE BIO SURGEON STRL SZ 6.5 (GLOVE) ×2 IMPLANT
GLOVE BIO SURGEON STRL SZ8 (GLOVE) ×6 IMPLANT
GLOVE BIO SURGEONS STRL SZ 6.5 (GLOVE) ×1
GLOVE BIOGEL PI IND STRL 7.0 (GLOVE) ×1 IMPLANT
GLOVE BIOGEL PI IND STRL 8 (GLOVE) ×1 IMPLANT
GLOVE BIOGEL PI IND STRL 8.5 (GLOVE) ×1 IMPLANT
GLOVE BIOGEL PI INDICATOR 7.0 (GLOVE) ×2
GLOVE BIOGEL PI INDICATOR 8 (GLOVE) ×2
GLOVE BIOGEL PI INDICATOR 8.5 (GLOVE) ×2
GLOVE ORTHO TXT STRL SZ7.5 (GLOVE) ×6 IMPLANT
GOWN STRL REUS W/ TWL LRG LVL3 (GOWN DISPOSABLE) ×2 IMPLANT
GOWN STRL REUS W/ TWL XL LVL3 (GOWN DISPOSABLE) ×1 IMPLANT
GOWN STRL REUS W/TWL 2XL LVL3 (GOWN DISPOSABLE) ×3 IMPLANT
GOWN STRL REUS W/TWL LRG LVL3 (GOWN DISPOSABLE) ×4
GOWN STRL REUS W/TWL XL LVL3 (GOWN DISPOSABLE) ×2
KIT ROOM TURNOVER OR (KITS) ×3 IMPLANT
MANIFOLD NEPTUNE II (INSTRUMENTS) IMPLANT
NEEDLE 18GX1X1/2 (RX/OR ONLY) (NEEDLE) IMPLANT
NEEDLE 22X1 1/2 (OR ONLY) (NEEDLE) ×3 IMPLANT
NEEDLE SPNL 18GX3.5 QUINCKE PK (NEEDLE) IMPLANT
NS IRRIG 1000ML POUR BTL (IV SOLUTION) IMPLANT
PACK ARTHROSCOPY DSU (CUSTOM PROCEDURE TRAY) ×3 IMPLANT
PAD ARMBOARD 7.5X6 YLW CONV (MISCELLANEOUS) ×6 IMPLANT
PADDING CAST COTTON 6X4 STRL (CAST SUPPLIES) ×3 IMPLANT
SET ARTHROSCOPY TUBING (MISCELLANEOUS) ×2
SET ARTHROSCOPY TUBING LN (MISCELLANEOUS) ×1 IMPLANT
SPONGE GAUZE 4X4 12PLY STER LF (GAUZE/BANDAGES/DRESSINGS) ×3 IMPLANT
SPONGE LAP 4X18 X RAY DECT (DISPOSABLE) ×3 IMPLANT
SUT ETHILON 2 0 FS 18 (SUTURE) IMPLANT
SUT ETHILON 3 0 PS 1 (SUTURE) IMPLANT
SYR 20ML ECCENTRIC (SYRINGE) IMPLANT
SYR CONTROL 10ML LL (SYRINGE) IMPLANT
TOWEL OR 17X24 6PK STRL BLUE (TOWEL DISPOSABLE) ×3 IMPLANT
TOWEL OR 17X26 10 PK STRL BLUE (TOWEL DISPOSABLE) ×3 IMPLANT
TUBE CONNECTING 12'X1/4 (SUCTIONS) ×1
TUBE CONNECTING 12X1/4 (SUCTIONS) ×2 IMPLANT
WATER STERILE IRR 1000ML POUR (IV SOLUTION) ×3 IMPLANT

## 2015-02-21 NOTE — H&P (View-Only) (Signed)
ORTHOPAEDIC CONSULTATION  REQUESTING PHYSICIAN: Evelina Bucy, MD  Chief Complaint: R knee pain  HPI: [name] Wise is a 64 y.o. male who has a history of gout as well as schizophrenia diabetes and presented initially with sepsis and encephalopathy. Medicine team is admitting this patient. He had an aspiration of his right knee attempted in the emergency department no fluid was obtained. In discussion with the patient and his English is not his primary language and he has been noted to not communicate well due to this versus a schizophrenia in the past. He reports that he has had several months of pain in his right knee he reports that he does not walk on it he reports he uses a wheelchair.  Past Medical History  Diagnosis Date  . Gout   . Hypertension   . Schizophrenia   . Tobacco abuse    Past Surgical History  Procedure Laterality Date  . None     History   Social History  . Marital Status: Divorced    Spouse Name: N/A  . Number of Children: N/A  . Years of Education: N/A   Social History Main Topics  . Smoking status: Current Every Day Smoker    Types: Cigarettes  . Smokeless tobacco: Not on file  . Alcohol Use: No  . Drug Use: No  . Sexual Activity: Yes   Other Topics Concern  . None   Social History Narrative   Family History  Problem Relation Age of Onset  . Hypertension Mother   . Gout Father    No Known Allergies Prior to Admission medications   Medication Sig Start Date End Date Taking? Authorizing Provider  allopurinol (ZYLOPRIM) 100 MG tablet Take 2 tablets (200 mg total) by mouth daily. Start on 02/16/15 02/16/15  Yes Orson Eva, MD  aspirin EC 325 MG EC tablet Take 1 tablet (325 mg total) by mouth daily. 01/20/15  Yes Costin Karlyne Greenspan, MD  benztropine (COGENTIN) 1 MG tablet Take 1 tablet by mouth daily. 12/15/14  Yes Historical Provider, MD  ferrous sulfate 325 (65 FE) MG tablet Take 1 tablet (325 mg total) by mouth 2 (two) times daily with a meal.  02/09/15  Yes Orson Eva, MD  folic acid (FOLVITE) 1 MG tablet Take 1 tablet (1 mg total) by mouth daily. 01/20/15  Yes Costin Karlyne Greenspan, MD  insulin glargine (LANTUS) 100 UNIT/ML injection Inject 10 Units into the skin at bedtime.   Yes Historical Provider, MD  insulin lispro (HUMALOG) 100 UNIT/ML injection Inject 5 Units into the skin 3 (three) times daily before meals.   Yes Historical Provider, MD  metoprolol tartrate (LOPRESSOR) 25 MG tablet Take 1 tablet (25 mg total) by mouth 2 (two) times daily. 01/20/15  Yes Costin Karlyne Greenspan, MD  oxyCODONE (OXY IR/ROXICODONE) 5 MG immediate release tablet Take 10 mg by mouth every 4 (four) hours as needed for severe pain (Hopd for sedation, confusion, or respiratory distress.).   Yes Historical Provider, MD  probenecid (BENEMID) 500 MG tablet Take 500 mg by mouth 2 (two) times daily.   Yes Historical Provider, MD  sitaGLIPtin (JANUVIA) 25 MG tablet Take 1 tablet (25 mg total) by mouth daily. 02/09/15  Yes Orson Eva, MD  sodium bicarbonate 650 MG tablet Take 1,300 mg by mouth 2 (two) times daily.   Yes Historical Provider, MD  vitamin B-12 500 MCG tablet Take 1 tablet (500 mcg total) by mouth daily. 02/09/15  Yes Orson Eva, MD  oxyCODONE-acetaminophen (PERCOCET/ROXICET) 5-325 MG per tablet Take 1-2 tablets by mouth every 4 (four) hours as needed for moderate pain or severe pain. Patient not taking: Reported on 02/19/2015 02/11/15   Estill Dooms, MD  predniSONE (DELTASONE) 50 MG tablet Take 1 tablet (50 mg total) by mouth daily with breakfast. X 4 days Patient not taking: Reported on 02/19/2015 02/09/15   Orson Eva, MD   Dg Chest Port 1 View  02/19/2015   CLINICAL DATA:  Patient with fever and rash. History of schizophrenia.  EXAM: PORTABLE CHEST - 1 VIEW  COMPARISON:  02/06/2015  FINDINGS: Monitoring leads overlie the patient. Stable enlarged cardiac and mediastinal contours. Pulmonary vascular redistribution. Low lung volumes. Bibasilar heterogeneous opacities. No  definite pleural effusion or pneumothorax.  IMPRESSION: Low lung volumes with basilar atelectasis.  Cardiomegaly without definite evidence for acute pulmonary edema.   Electronically Signed   By: Lovey Newcomer M.D.   On: 02/19/2015 17:16    Positive ROS: All other systems have been reviewed and were otherwise negative with the exception of those mentioned in the HPI and as above.  Labs cbc  Recent Labs  02/19/15 1617  WBC 13.5*  HGB 8.9*  HCT 29.0*  PLT 313    Labs inflam No results for input(s): CRP in the last 72 hours.  Invalid input(s): ESR  Labs coag No results for input(s): INR, PTT in the last 72 hours.  Invalid input(s): PT   Recent Labs  02/19/15 1720  NA 135  K 4.7  CL 98  CO2 23  GLUCOSE 152*  BUN 46*  CREATININE 3.39*  CALCIUM 8.6    Physical Exam: Filed Vitals:   02/19/15 1944  BP: 150/91  Pulse: 118  Temp: 99.5 F (37.5 C)  Resp: 14   General: Alert, no acute distress Cardiovascular: No pedal edema Respiratory: No cyanosis, no use of accessory musculature GI: No organomegaly, abdomen is soft and non-tender Skin: No lesions in the area of chief complaint other than those listed below in MSK exam.  Neurologic: Sensation intact distally Psychiatric: difficult affect to accept Lymphatic: No axillary or cervical lymphadenopathy  MUSCULOSKELETAL:  RLE: He has some mild superficial swelling and possible spinal fusion to his right knee. There is no surrounding erythema he has no pain with smaller motion. He does have pain on top palpation of the knee joint. He has no warmth. Distally he has good pulses he wiggles his toes and his ankle he does not participate with any other neurovascular exam. Compartments are soft Other extremities are atraumatic  Assessment: Right knee pain question acute on chronic gout flare.  Plan: He had a negative aspiration attempt in the emergency department. Should this still be a possible source of sepsis the next step  would be ultrasound-guided aspiration by VIR to further attempt to get fluid. He's been treated multiple times for gout and it is very possible that he has a gout flare. VIR tap would be the only way to know for sure if we are unable to find another source of sepsis.  Recommend treating for gout and consider VIR aspiration of R knee if needed.  Please call me should an aspiration be ordered so that I can follow it up closely.   WBAT, PT   Renette Butters, MD Cell [phone]   02/19/2015 7:48 PM

## 2015-02-21 NOTE — Interval H&P Note (Signed)
History and Physical Interval Note:  02/21/2015 7:37 AM  [name] Wise  has presented today for surgery, with the diagnosis of SEPTIC RIGHT KNEE  The various methods of treatment have been discussed with the patient and family. After consideration of risks, benefits and other options for treatment, the patient has consented to  Procedure(s): ARTHROSCOPY KNEE (Right) as a surgical intervention .  The patient's history has been reviewed, patient examined, no change in status, stable for surgery.  I have reviewed the patient's chart and labs.  Questions were answered to the patient's satisfaction.     Jancie Kercher F

## 2015-02-21 NOTE — Brief Op Note (Signed)
Extensive arthroscopic debridement as well as lavage right knee. Op Note dictated. Findings are more consistent with out of control, widespread gout than infection. Topaceous crystals throughout knee and imbedded in meniscus and articular cartilage. Injected with marcaine and depomedrol at completion.  Await cultures, antibiotics until ID comfortable this is NOT septic knee and therefore NOT source of sepsis. Current metabolic and renal status making gout harder to manage. He has the same but lesser extent in Left knee but not enough symptoms there to be more aggressive now..Dr Richardson Landryan Kevante Lunt

## 2015-02-21 NOTE — Anesthesia Postprocedure Evaluation (Signed)
Anesthesia Post Note  Patient: [name] Wise  Procedure(s) Performed: Procedure(s) (LRB): ARTHROSCOPY KNEE (Right)  Anesthesia type: General  Patient location: PACU  Post pain: Pain level controlled and Adequate analgesia  Post assessment: Post-op Vital signs reviewed, Patient's Cardiovascular Status Stable, Respiratory Function Stable, Patent Airway and Pain level controlled  Last Vitals:  Filed Vitals:   02/21/15 0932  BP: 148/95  Pulse: 88  Temp:   Resp: 13    Post vital signs: Reviewed and stable  Level of consciousness: awake, alert  and oriented  Complications: No apparent anesthesia complications

## 2015-02-21 NOTE — Transfer of Care (Signed)
Immediate Anesthesia Transfer of Care Note  Patient: [name] Wise  Procedure(s) Performed: Procedure(s): ARTHROSCOPY KNEE (Right)  Patient Location: PACU  Anesthesia Type:General  Level of Consciousness: awake, alert  and sedated  Airway & Oxygen Therapy: Patient connected to face mask oxygen  Post-op Assessment: Report given to RN  Post vital signs: stable  Last Vitals:  Filed Vitals:   02/21/15 0431  BP: 106/62  Pulse: 87  Temp: 37.2 C  Resp: 24    Complications: No apparent anesthesia complications

## 2015-02-21 NOTE — Anesthesia Procedure Notes (Signed)
Procedure Name: LMA Insertion Date/Time: 02/21/2015 7:44 AM Performed by: Ellin GoodieWEAVER, Diamond Jentz M Pre-anesthesia Checklist: Patient identified, Emergency Drugs available, Suction available, Patient being monitored and Timeout performed Patient Re-evaluated:Patient Re-evaluated prior to inductionOxygen Delivery Method: Circle system utilized Preoxygenation: Pre-oxygenation with 100% oxygen Intubation Type: IV induction Ventilation: Mask ventilation without difficulty LMA: LMA inserted LMA Size: 5.0 Tube type: Oral Number of attempts: 1 Placement Confirmation: positive ETCO2 and breath sounds checked- equal and bilateral Tube secured with: Tape Dental Injury: Teeth and Oropharynx as per pre-operative assessment

## 2015-02-21 NOTE — Progress Notes (Signed)
[facility] PROGRESS NOTE  Yazan Gatling ZOX:[id number] DOB: 07-22-1951 DOA: 02/19/2015 PCP: Ralene Ok, MD  Admission history of present illness/brief narrative:  64 y.o. year old male with significant past medical history of schizophrenia, HTN, gout, IDDM, stage IV CKD, chronic diastolic heart failure presenting with sepsis, encephalopathy. Pt resident of local SNF. Per report, pt w/ noted temp 102.3. ? Rash as well as bilateral knee swelling. Patient noted to have been admitted for similar symptoms approximately 10 days ago. Was noted to have a fever as well as swollen right knee and encephalopathy. Patient had overall symptomatic improvement with  arthritis treatment. Did have 1 blood culture that grew out coag-negative staph. Was placed on steroid taper for gout Was otherwise a baseline by hospital discharge per report. Patient has somewhat limited English however primary complaints are abdominal and knee pain. Presented to the ER , heart rate in the 110s, respirations in the tens, blood pressure in the 120s to 190s, satting 99% on room air. White blood cell count 13.5, hemoglobin 8.9, creatinine 3.39. Glucose 152. Lactate 2.4. Urinalysis not indicative of infection. Knee aspiration attempted at bedside by ER physician that was unsuccessful. Chest x-ray negative for acute infiltrate. Patient had IR arthrocentesis which revealed 20 mL of purulent fluid, workup was significant for 35,000 white blood cells, and monosodium urate crystals Assessment/Plan: SIRS -Present on admission, evidenced by temperature of 103, heart rate 132, respiratory rate of 30, lactic acid of 2.5. -Workup thus far has shown a negative chest x-ray and urinalysis. -He presents with a painful right knee, appearing swollen with localized erythema on physical examination. -Swollen, painful, right knee, status post arthrocentesis showing 35,000 white blood cells, and monosodium urate crystals, status post extensive arthroscopic to  provide meant and right knee lavaged, finding more consistent of widespread out-of-control gout with 2 patient was crystals throughout knee and embedded in meniscus and articular cartilage. -Meanwhile will continue empiric IV antibiotic therapy with Zosyn and vancomycin pending cultures.  Severe gouty arthritis - painful, right knee, status post arthrocentesis showing 35,000 white blood cells, and monosodium urate crystals, status post extensive arthroscopic to provide meant and right knee lavaged, finding more consistent of widespread out-of-control gout with 2 patient was crystals throughout knee and embedded in meniscus and articular cartilage. - Patient reports left knee pain, right ankle pain, will start on IV Solu-Medrol for gouty arthritis. -Follow on synovial fluid for Gram stain, culture,  -Follow-up on blood cultures.   Stage IV chronic kidney disease. -Patient having baseline creatinine between 3.2 and 3.4 - will continue to monitor.   Acute encephalopathy. -Metabolic as follow-up atrophy.   Insulin-dependent diabetes mellitus -Continue Lantus 10 units subcutaneous daily at bedtime along with sliding scale coverage.  Anemia - Transfused 2 units packed red blood cell 3/18 - Check CBC in a.m.  Code Status: Full code Family Communication: Family not available Disposition Plan: Anticipate discharge back to skilled nursing facility when medically stable   Consultants:  Orthopedic surgery  Procedures: - 3/19 extensive arthroscopic to provide meant and right knee lavaged, finding more consistent of widespread out-of-control gout with 2 patient was crystals throughout knee and embedded in meniscus and articular cartilage. - 2 units packed red blood cell transfusion 3/18 Antibiotics:  Vancomycin  Zosyn  Subjective: Complaints of right knee pain, as well as report tenderness in left knee as well.  Objective: Filed Vitals:   02/21/15 1421  BP:   Pulse: 100  Temp:    Resp:     Intake/Output Summary (  Last 24 hours) at 02/21/15 1641 Last data filed at 02/21/15 1519  Gross per 24 hour  Intake 4262.92 ml  Output    300 ml  Net 3962.92 ml   Filed Weights   02/19/15 2215 02/20/15 0500 02/21/15 0133  Weight: 57.7 kg (127 lb 3.3 oz) 55.7 kg (122 lb 12.7 oz) 56.1 kg (123 lb 10.9 oz)    Exam:   General:  Patient is awake and alert, can follow simple commands  Cardiovascular: Regular rate and rhythm, tachycardic normal S1-S2  Respiratory: Normal respiratory effort, lungs are clear to auscultation bilaterally no wheezing rhonchi or rales  Abdomen: Soft nontender nondistended  Musculoskeletal: Right knee wrapped in Ace status post surgery, patient had left knee tender to palpation   Skin: Macular papular rash involving trunk  Data Reviewed: Basic Metabolic Panel:  Recent Labs Lab 02/19/15 1720 02/19/15 2059 02/20/15 0345  NA 135  --  135  K 4.7  --  3.9  CL 98  --  104  CO2 23  --  20  GLUCOSE 152*  --  131*  BUN 46*  --  39*  CREATININE 3.39* 3.40* 3.23*  CALCIUM 8.6  --  7.9*   Liver Function Tests:  Recent Labs Lab 02/19/15 1720 02/20/15 0345  AST 28 28  ALT 18 14  ALKPHOS 121* 101  BILITOT 0.5 0.7  PROT 6.8 5.6*  ALBUMIN 2.0* 1.6*   No results for input(s): LIPASE, AMYLASE in the last 168 hours.  Recent Labs Lab 02/19/15 2059  AMMONIA 26   CBC:  Recent Labs Lab 02/19/15 1617 02/19/15 2059 02/20/15 0345  WBC 13.5* 10.3 11.6*  NEUTROABS 11.4*  --  10.1*  HGB 8.9* 8.3* 7.2*  HCT 29.0* 27.2* 22.6*  MCV 94.8 94.8 94.6  PLT 313 262 235   Cardiac Enzymes: No results for input(s): CKTOTAL, CKMB, CKMBINDEX, TROPONINI in the last 168 hours. BNP (last 3 results)  Recent Labs  02/06/15 0820  BNP 1559.6*    ProBNP (last 3 results) No results for input(s): PROBNP in the last 8760 hours.  CBG:  Recent Labs Lab 02/21/15 0159 02/21/15 0434 02/21/15 0635 02/21/15 0941 02/21/15 1144  GLUCAP 147* 130*  123* 102* 134*    Recent Results (from the past 240 hour(s))  Blood Culture (routine x 2)     Status: None (Preliminary result)   Collection Time: 02/19/15  4:17 PM  Result Value Ref Range Status   Specimen Description BLOOD  Final   Special Requests NONE  Final   Culture   Final           BLOOD CULTURE RECEIVED NO GROWTH TO DATE CULTURE WILL BE HELD FOR 5 DAYS BEFORE ISSUING A FINAL NEGATIVE REPORT Performed at [facility]    Report Status PENDING  Incomplete  Urine culture     Status: None   Collection Time: 02/19/15  4:19 PM  Result Value Ref Range Status   Specimen Description URINE, RANDOM  Final   Special Requests NONE  Final   Colony Count NO GROWTH Performed at [facility]   Final   Culture NO GROWTH Performed at [facility]   Final   Report Status 02/21/2015 FINAL  Final  Blood Culture (routine x 2)     Status: None (Preliminary result)   Collection Time: 02/19/15  4:22 PM  Result Value Ref Range Status   Specimen Description BLOOD RIGHT HAND  Final   Special Requests BOTTLES DRAWN AEROBIC AND  ANAEROBIC 4CC  Final   Culture   Final           BLOOD CULTURE RECEIVED NO GROWTH TO DATE CULTURE WILL BE HELD FOR 5 DAYS BEFORE ISSUING A FINAL NEGATIVE REPORT [facility]formed at [facility]    Report Status PENDING  Incomplete  MRSA PCR Screening     Status: None   Collection Time: 02/19/15  9:45 PM  Result Value Ref Range Status   MRSA by PCR NEGATIVE NEGATIVE Final    Comment:        The GeneXpert MRSA Assay (FDA approved for NASAL specimens only), is one component of a comprehensive MRSA colonization surveillance program. It is not intended to diagnose MRSA infection nor to guide or monitor treatment for MRSA infections.   Gram stain     Status: None   Collection Time: 02/20/15  5:06 PM  Result Value Ref Range Status   Specimen Description SYNOVIAL KNEE  Final   Special Requests NONE  Final   Gram Stain   Final    ABUNDANT WBC  PRESENT, PREDOMINANTLY PMN NO ORGANISMS SEEN Gram Stain Report Called to,Read Back By and Verified With: WOODS,T. RN @2005  BY MCCOY,N.    Report Status 02/20/2015 FINAL  Final  Body fluid culture     Status: None (Preliminary result)   Collection Time: 02/20/15  5:06 PM  Result Value Ref Range Status   Specimen Description SYNOVIAL KNEE  Final   Special Requests NONE  Final   Gram Stain   Final    ABUNDANT WBC PRESENT, PREDOMINANTLY PMN NO ORGANISMS SEEN Gram Stain Report Called to,Read Back By and Verified With: Gram Stain Report Called to,Read Back By and Verified With: WOODS T.@2005  BY MCCOY N. [facility] by [facility] Performed at [facility]    Culture NO GROWTH Performed at [facility]   Final   Report Status PENDING  Incomplete  Surgical pcr screen     Status: Abnormal   Collection Time: 02/21/15  6:27 AM  Result Value Ref Range Status   MRSA, PCR NEGATIVE NEGATIVE Final   Staphylococcus aureus POSITIVE (A) NEGATIVE Final    Comment:        The Xpert SA Assay (FDA approved for NASAL specimens in patients over 63 years of age), is one component of a comprehensive surveillance program.  Test performance has been validated by [facility] for patients greater than or equal to 63 year old. It is not intended to diagnose infection nor to guide or monitor treatment.      Studies: Ct Abdomen Pelvis Wo Contrast  02/19/2015   CLINICAL DATA:  Generalized abdominal pain.  EXAM: CT ABDOMEN AND PELVIS WITHOUT CONTRAST  TECHNIQUE: Multidetector CT imaging of the abdomen and pelvis was performed following the standard protocol without IV contrast.  COMPARISON:  CT scan of January 17, 2015.  FINDINGS: No significant abnormality seen in the visualized lung bases. No significant osseous abnormality is noted.  No gallstones are noted. No focal abnormality is noted in the liver, spleen or pancreas on these unenhanced images. Adrenal glands and kidneys appear  normal. No hydronephrosis or renal obstruction is noted. No renal or ureteral calculi are noted. 3.1 cm infrarenal abdominal aortic aneurysm is noted. There is no evidence of bowel obstruction. No abnormal fluid collection is noted. Urinary bladder appears normal. No significant adenopathy is noted. The appendix appears normal.  IMPRESSION: 3.1 cm infrarenal abdominal aortic aneurysm. Recommend followup by ultrasound in 3 years.  This recommendation follows ACR consensus guidelines: White Paper of the ACR Incidental Findings Committee II on Vascular Findings. J Am Coll Radiol 2013; 10:789-794.  No other significant abnormality seen in the abdomen or pelvis.   Electronically Signed   By: Lupita Raider, M.D.   On: 02/19/2015 21:21   Dg Knee 1-2 Views Right  02/19/2015   CLINICAL DATA:  Acute onset of fever and sepsis. Generalized right knee pain and swelling. Initial encounter.  EXAM: RIGHT KNEE - 1-2 VIEW  COMPARISON:  Right knee radiographs performed 02/06/2015  FINDINGS: Minimal linear lucency at the superior pole of the patella may be artifactual in nature, though a fracture of the patella cannot be entirely excluded. Evaluation is suboptimal due to limitations in positioning.  The joint spaces are grossly preserved. No significant degenerative change is seen; the patellofemoral joint is grossly unremarkable in appearance. A fabella is noted.  A small to moderate knee joint effusion is noted. Scattered vascular calcifications are seen.  IMPRESSION: 1. Minimal linear lucency at the superior pole of the patella may be artifactual in nature, though a fracture of the patella cannot be entirely excluded. Would correlate for evidence of recent trauma. 2. Small to moderate knee joint effusion noted. 3. Scattered vascular calcifications seen.   Electronically Signed   By: Roanna Raider M.D.   On: 02/19/2015 20:41   Ct Head Wo Contrast  02/19/2015   CLINICAL DATA:  Acute onset of altered mental status.  Encephalopathy. Initial encounter.  EXAM: CT HEAD WITHOUT CONTRAST  TECHNIQUE: Contiguous axial images were obtained from the base of the skull through the vertex without intravenous contrast.  COMPARISON:  MRI of the brain and CT of the head performed 02/03/2015  FINDINGS: There is no evidence of acute infarction, mass lesion, or intra- or extra-axial hemorrhage on CT.  Prominence of the ventricles and sulci reflects moderate cortical volume loss. Cerebellar atrophy is noted. Diffuse periventricular and subcortical white matter change likely reflects small vessel ischemic microangiopathy. Small chronic lacunar infarcts are noted at the right basal ganglia.  The brainstem and fourth ventricle are within normal limits. The basal ganglia are unremarkable in appearance. The cerebral hemispheres demonstrate grossly normal gray-white differentiation. No mass effect or midline shift is seen.  There is no evidence of fracture; visualized osseous structures are unremarkable in appearance. The orbits are within normal limits. The paranasal sinuses and mastoid air cells are well-aerated. No significant soft tissue abnormalities are seen.  IMPRESSION: 1. No acute intracranial pathology seen on CT. 2. Moderate cortical volume loss and diffuse small vessel ischemic microangiopathy. 3. Likely small chronic lacunar infarcts at the right basal ganglia.   Electronically Signed   By: Roanna Raider M.D.   On: 02/19/2015 21:13   Dg Chest Port 1 View  02/19/2015   CLINICAL DATA:  Patient with fever and rash. History of schizophrenia.  EXAM: PORTABLE CHEST - 1 VIEW  COMPARISON:  02/06/2015  FINDINGS: Monitoring leads overlie the patient. Stable enlarged cardiac and mediastinal contours. Pulmonary vascular redistribution. Low lung volumes. Bibasilar heterogeneous opacities. No definite pleural effusion or pneumothorax.  IMPRESSION: Low lung volumes with basilar atelectasis.  Cardiomegaly without definite evidence for acute pulmonary  edema.   Electronically Signed   By: Annia Belt M.D.   On: 02/19/2015 17:16   Dg Fluoro Guide Ndl Plc/bx  02/21/2015   CLINICAL DATA:  Redness and swelling and pain of the right knee. Sepsis.  EXAM: RIGHT KNEE ASPIRATION UNDER FLUOROSCOPY  FLUOROSCOPY TIME:  Fluoroscopy Time (in minutes and seconds): 0 minutes 13 seconds  Number of Acquired Images:  1 freeze frame  PROCEDURE: Overlying skin prepped with Betadine, draped in the usual sterile fashion, and infiltrated locally with buffered Lidocaine. 18 gauge needle was inserted into the suprapatellar recess of the right knee and 20 cc of pus was aspirated.  Mr. Greth tolerated the procedure well.  IMPRESSION: Technically successful right knee aspiration under fluoroscopy. The patient's physician was immediately informed of the results.   Electronically Signed   By: Francene Boyers M.D.   On: 02/21/2015 11:21    Scheduled Meds: . allopurinol  200 mg Oral Daily  . benztropine  1 mg Oral Daily  . heparin  5,000 Units Subcutaneous 3 times per day  . insulin aspart  0-9 Units Subcutaneous 6 times per day  . insulin glargine  10 Units Subcutaneous QHS  . methylPREDNISolone (SOLU-MEDROL) injection  60 mg Intravenous Daily  . metoprolol tartrate  25 mg Oral BID  . oseltamivir  30 mg Oral QHS  . piperacillin-tazobactam (ZOSYN)  IV  2.25 g Intravenous Q8H  . sodium bicarbonate  1,300 mg Oral BID  . sodium chloride  3 mL Intravenous Q12H  . vancomycin  750 mg Intravenous Q48H   Continuous Infusions: . sodium chloride 10 mL/hr at 02/21/15 0646    Active Problems:   Sepsis    Time spent: 35 minutes    Jovahn Breit  [facility] Pager [phone] If 7PM-7AM, please contact night-coverage at [email or web address], password [facility] 02/21/2015, 4:41 PM  LOS: 2 days

## 2015-02-21 NOTE — Anesthesia Preprocedure Evaluation (Addendum)
Anesthesia Evaluation  Patient identified by MRN, date of birth, ID band Patient awake    Reviewed: Allergy & Precautions, NPO status , Patient's Chart, lab work & pertinent test results  Airway Mallampati: II   Neck ROM: full    Dental   Pulmonary Current Smoker,  breath sounds clear to auscultation        Cardiovascular hypertension, Rhythm:regular Rate:Normal     Neuro/Psych PSYCHIATRIC DISORDERS Schizophrenia    GI/Hepatic   Endo/Other  diabetes, Well Controlled, Type 1, Insulin Dependent  Renal/GU Renal InsufficiencyRenal disease     Musculoskeletal   Abdominal   Peds  Hematology   Anesthesia Other Findings   Reproductive/Obstetrics                           Anesthesia Physical Anesthesia Plan  ASA: II  Anesthesia Plan: General   Post-op Pain Management:    Induction: Intravenous  Airway Management Planned: LMA  Additional Equipment:   Intra-op Plan:   Post-operative Plan:   Informed Consent: I have reviewed the patients History and Physical, chart, labs and discussed the procedure including the risks, benefits and alternatives for the proposed anesthesia with the patient or authorized representative who has indicated his/her understanding and acceptance.     Plan Discussed with: CRNA, Anesthesiologist and Surgeon  Anesthesia Plan Comments:         Anesthesia Quick Evaluation

## 2015-02-22 LAB — CBC WITH DIFFERENTIAL/PLATELET
BASOS ABS: 0 10*3/uL (ref 0.0–0.1)
BASOS PCT: 0 % (ref 0–1)
EOS ABS: 0 10*3/uL (ref 0.0–0.7)
Eosinophils Relative: 0 % (ref 0–5)
HCT: 29.7 % — ABNORMAL LOW (ref 39.0–52.0)
Hemoglobin: 9.6 g/dL — ABNORMAL LOW (ref 13.0–17.0)
Lymphocytes Relative: 4 % — ABNORMAL LOW (ref 12–46)
Lymphs Abs: 0.3 10*3/uL — ABNORMAL LOW (ref 0.7–4.0)
MCH: 28.8 pg (ref 26.0–34.0)
MCHC: 32.3 g/dL (ref 30.0–36.0)
MCV: 89.2 fL (ref 78.0–100.0)
Monocytes Absolute: 0 10*3/uL — ABNORMAL LOW (ref 0.1–1.0)
Monocytes Relative: 0 % — ABNORMAL LOW (ref 3–12)
Neutro Abs: 8.5 10*3/uL — ABNORMAL HIGH (ref 1.7–7.7)
Neutrophils Relative %: 96 % — ABNORMAL HIGH (ref 43–77)
Platelets: 180 10*3/uL (ref 150–400)
RBC: 3.33 MIL/uL — ABNORMAL LOW (ref 4.22–5.81)
RDW: 17.4 % — AB (ref 11.5–15.5)
WBC: 8.8 10*3/uL (ref 4.0–10.5)

## 2015-02-22 LAB — COMPREHENSIVE METABOLIC PANEL
ALT: 18 U/L (ref 0–53)
ANION GAP: 9 (ref 5–15)
AST: 23 U/L (ref 0–37)
Albumin: 1.4 g/dL — ABNORMAL LOW (ref 3.5–5.2)
Alkaline Phosphatase: 121 U/L — ABNORMAL HIGH (ref 39–117)
BILIRUBIN TOTAL: 0.8 mg/dL (ref 0.3–1.2)
BUN: 30 mg/dL — AB (ref 6–23)
CALCIUM: 8.3 mg/dL — AB (ref 8.4–10.5)
CO2: 21 mmol/L (ref 19–32)
Chloride: 105 mmol/L (ref 96–112)
Creatinine, Ser: 2.92 mg/dL — ABNORMAL HIGH (ref 0.50–1.35)
GFR calc non Af Amer: 21 mL/min — ABNORMAL LOW (ref >90)
GFR, EST AFRICAN AMERICAN: 25 mL/min — AB (ref >90)
Glucose, Bld: 160 mg/dL — ABNORMAL HIGH (ref 70–99)
Potassium: 4.5 mmol/L (ref 3.5–5.1)
Sodium: 135 mmol/L (ref 135–145)
Total Protein: 5.7 g/dL — ABNORMAL LOW (ref 6.0–8.3)

## 2015-02-22 LAB — GLUCOSE, CAPILLARY
GLUCOSE-CAPILLARY: 135 mg/dL — AB (ref 70–99)
GLUCOSE-CAPILLARY: 148 mg/dL — AB (ref 70–99)
GLUCOSE-CAPILLARY: 166 mg/dL — AB (ref 70–99)
GLUCOSE-CAPILLARY: 168 mg/dL — AB (ref 70–99)
GLUCOSE-CAPILLARY: 217 mg/dL — AB (ref 70–99)
Glucose-Capillary: 121 mg/dL — ABNORMAL HIGH (ref 70–99)
Glucose-Capillary: 185 mg/dL — ABNORMAL HIGH (ref 70–99)

## 2015-02-22 MED ORDER — INSULIN ASPART 100 UNIT/ML ~~LOC~~ SOLN
0.0000 [IU] | Freq: Three times a day (TID) | SUBCUTANEOUS | Status: DC
  Administered 2015-02-23 (×2): 2 [IU] via SUBCUTANEOUS

## 2015-02-22 MED ORDER — PANTOPRAZOLE SODIUM 40 MG PO TBEC
40.0000 mg | DELAYED_RELEASE_TABLET | Freq: Every day | ORAL | Status: DC
  Administered 2015-02-22 – 2015-02-23 (×2): 40 mg via ORAL
  Filled 2015-02-22 (×2): qty 1

## 2015-02-22 MED ORDER — METHYLPREDNISOLONE SODIUM SUCC 125 MG IJ SOLR
60.0000 mg | Freq: Two times a day (BID) | INTRAMUSCULAR | Status: DC
  Administered 2015-02-22 – 2015-02-23 (×2): 60 mg via INTRAVENOUS
  Filled 2015-02-22 (×3): qty 0.96

## 2015-02-22 MED ORDER — METHYLPREDNISOLONE SODIUM SUCC 40 MG IJ SOLR: 40.0000 mg | Freq: Every day | INTRAMUSCULAR | Status: DC

## 2015-02-22 MED ORDER — INSULIN ASPART 100 UNIT/ML ~~LOC~~ SOLN: 0.0000 [IU] | Freq: Three times a day (TID) | SUBCUTANEOUS | Status: DC

## 2015-02-22 NOTE — Plan of Care (Signed)
Problem: Phase II Progression Outcomes Goal: Progress activity as tolerated unless otherwise ordered Outcome: Progressing PT /OT consult orderd

## 2015-02-22 NOTE — Evaluation (Signed)
Physical Therapy Evaluation Patient Details Name: [name] Wise MRN: [id number] DOB: [birth year] Today's Date: 02/22/2015   History of Present Illness  64 y.o. year old male with significant past medical history of schizophrenia, HTN, gout, IDDM, stage IV CKD, chronic diastolic heart failure presenting with sepsis, encephalopathy. Pt resident of local SNF. Found to have SIRS, severe gouty arthritis - s/p Rt knee arthrocentesis, and CKD.  Clinical Impression  Pt admitted with above complications. Pt currently with functional limitations due to the deficits listed below (see PT Problem List). Min-mod assist for bed mobility, unable to stand fully upright despite max assist. Rt knee significantly limited extension. Recommend "zero knee - bone foam" for positioning to prevent further decline in ROM. Pt willing to work with therapy and tolerated exercises as well as he was able. Pt will benefit from skilled PT to increase their independence and safety with mobility to allow discharge to the venue listed below.       Follow Up Recommendations SNF    Equipment Recommendations  Other (comment) (Zero knee "bone foam")    Recommendations for Other Services       Precautions / Restrictions Precautions Precautions: Fall Precaution Comments: Speaks some english. Used telephone interpreter "Cambodian" Restrictions Weight Bearing Restrictions: Yes RLE Weight Bearing: Weight bearing as tolerated      Mobility  Bed Mobility Overal bed mobility: Needs Assistance Bed Mobility: Rolling;Sidelying to Sit;Sit to Sidelying Rolling: Min guard Sidelying to sit: Mod assist     Sit to sidelying: Mod assist General bed mobility comments: Mod assist for log roll technique to provide truncal support and RLE support on and off of bed. VC for technique throughout. Uses rail. Attempted to let Pt use LLE to support RLE however unable to do this at this time.  Transfers Overall transfer level: Needs  assistance Equipment used: Rolling walker (2 wheeled) Transfers: Sit to/from Stand Sit to Stand: Max assist         General transfer comment: Pt able to stand up 50% with max assist. PT blocking knees. Cues for technique and anterior weight shift however fatigues to quickly before upright.   Ambulation/Gait                Stairs            Wheelchair Mobility    Modified Rankin (Stroke Patients Only)       Balance Overall balance assessment: Needs assistance Sitting-balance support: No upper extremity supported;Feet supported Sitting balance-Leahy Scale: Fair Sitting balance - Comments: Pt tolerated sitting EOB approximately 10 minutes while performing therapeutic exercises. Sits unsupported. Rather lethargic but still able to participate with VC and occasional tactile cues.                                     Pertinent Vitals/Pain Pain Assessment: Faces Faces Pain Scale: Hurts even more Pain Location: "Everywhere" Rt knee    Home Living Family/patient expects to be discharged to:: Skilled nursing facility Living Arrangements:  (SNF) Available Help at Discharge: Skilled Nursing Facility Type of Home: Skilled Nursing Facility         Home Equipment: None      Prior Function Level of Independence: Needs assistance   Gait / Transfers Assistance Needed: States he has not ambulated in 5 months - MD note reports he has not been ambulatory for 1 year  ADL's / Homemaking Assistance Needed: Needs assist for ADLs  Comments: Pt did not report his PLOF- initially stated he was able to stand with a RW at SNF, then changed his mind after practicing this task and states he has not been using a RW to stand.     Hand Dominance        Extremity/Trunk Assessment   Upper Extremity Assessment: Defer to OT evaluation           Lower Extremity Assessment: RLE deficits/detail RLE Deficits / Details: Unable to fully extend Rt knee actively or  passively, grimaces with pain when attempted. maintained increased external hip rotation and flexion.  Ace bandage around Rt knee       Communication   Communication: Prefers language other than English ([place]odian)  Cognition Arousal/Alertness: Lethargic Behavior During Therapy: WFL for tasks assessed/performed Overall Cognitive Status: Difficult to assess                      General Comments General comments (skin integrity, edema, etc.): Interpreter used via telephone.    Exercises General Exercises - Lower Extremity Ankle Circles/Pumps: AROM;Both;10 reps;Seated Long Arc Quad: AAROM;Strengthening;Both;10 reps;Seated Hip Flexion/Marching: Strengthening;AAROM;Both;10 reps;Seated Other Exercises Other Exercises: Rt knee flexion in sitting AAROM/Strengthening, x10      Assessment/Plan    PT Assessment Patient needs continued PT services  PT Diagnosis Difficulty walking;Generalized weakness;Acute pain   PT Problem List Decreased strength;Decreased range of motion;Decreased activity tolerance;Decreased balance;Decreased mobility;Decreased knowledge of use of DME;Decreased knowledge of precautions;Pain  PT Treatment Interventions DME instruction;Functional mobility training;Therapeutic activities;Therapeutic exercise;Balance training;Neuromuscular re-education;Patient/family education;Modalities;Wheelchair mobility training   PT Goals (Current goals can be found in the Care Plan section) Acute Rehab PT Goals Patient Stated Goal: none stated PT Goal Formulation: With patient Time For Goal Achievement: 03/08/15 Potential to Achieve Goals: Fair    Frequency Min 2X/week   Barriers to discharge        Co-evaluation               End of Session   Activity Tolerance: Patient limited by fatigue;Patient limited by pain Patient left: in bed;with call bell/phone within reach Nurse Communication: Mobility status;Need for lift equipment;Other (comment) (Need for "zero  knee" foam)         Time: [id number] PT Time Calculation (min) (ACUTE ONLY): 32 min   Charges:   PT Evaluation $Initial PT Evaluation Tier I: 1 Procedure PT Treatments $Therapeutic Exercise: 8-22 mins   PT G Codes:        [place], Mikya Don S 02/22/2015, 5:10 PM Sunday SpillersLogan Secor [place], [place] [phone]

## 2015-02-22 NOTE — Plan of Care (Signed)
Problem: Phase I Progression Outcomes Goal: Initial discharge plan identified Outcome: Completed/Met Date Met:  02/22/15 Patient will return to [facility] when medically stable

## 2015-02-22 NOTE — Progress Notes (Signed)
Subjective: 1 Day Post-Op Procedure(s) (LRB): ARTHROSCOPY KNEE (Right) Patient reports pain as mild.  Minimal pain this am.    Objective: Vital signs in last 24 hours: Temp:  [97.5 F (36.4 C)-98.2 F (36.8 C)] 98.2 F (36.8 C) (03/20 0545) Pulse Rate:  [84-100] 84 (03/20 0545) Resp:  [13-18] 16 (03/20 0545) BP: (138-160)/(82-95) 143/85 mmHg (03/20 0545) SpO2:  [100 %] 100 % (03/20 0545)  Intake/Output from previous day: 03/19 0701 - 03/20 0700 In: 1682.7 [P.O.:400; I.V.:982.7; IV Piggyback:300] Out: 800 [Urine:800] Intake/Output this shift:     Recent Labs  02/19/15 1617 02/19/15 2059 02/20/15 0345 02/22/15 0640  HGB 8.9* 8.3* 7.2* 9.6*    Recent Labs  02/20/15 0345 02/22/15 0640  WBC 11.6* 8.8  RBC 2.39* 3.33*  HCT 22.6* 29.7*  PLT 235 180    Recent Labs  02/20/15 0345 02/22/15 0640  NA 135 135  K 3.9 4.5  CL 104 105  CO2 20 21  BUN 39* 30*  CREATININE 3.23* 2.92*  GLUCOSE 131* 160*  CALCIUM 7.9* 8.3*    Recent Labs  02/19/15 2059  INR 1.11    Neurovascular intact Sensation intact distally Intact pulses distally Compartment soft  No drainage noted through dressing Negative homans bilaterally  Assessment/Plan: 1 Day Post-Op Procedure(s) (LRB): ARTHROSCOPY KNEE (Right) Up with therapy  WBAT RLE Dry dressing change prn Continue plan per medicine   Otilio SaberM Lindsey Cire Deyarmin 02/22/2015, 9:01 AM

## 2015-02-22 NOTE — Progress Notes (Signed)
Patient POA called and stated she was concern because patient has not had his a haldol shot since Jan.  PT saw patient a recommended zero knee. Notified MD. MD stated to order  Zero knee

## 2015-02-22 NOTE — Progress Notes (Signed)
[facility] PROGRESS NOTE  [name] Wise ZOX:[id number] DOB: 11/20/1951 DOA: 02/19/2015 PCP: Ralene Ok, MD  Admission history of present illness/brief narrative:  64 y.o. year old male with significant past medical history of schizophrenia, HTN, gout, IDDM, stage IV CKD, chronic diastolic heart failure presenting with sepsis, encephalopathy. Pt resident of local SNF. Per report, pt w/ noted temp 102.3. ? Rash as well as bilateral knee swelling. Patient noted to have been admitted for similar symptoms approximately 10 days ago. Was noted to have a fever as well as swollen right knee and encephalopathy. Patient had overall symptomatic improvement with  arthritis treatment. Did have 1 blood culture that grew out coag-negative staph. Was placed on steroid taper for gout Was otherwise a baseline by hospital discharge per report. Patient has somewhat limited English however primary complaints are abdominal and knee pain. Presented to the ER , heart rate in the 110s, respirations in the tens, blood pressure in the 120s to 190s, satting 99% on room air. White blood cell count 13.5, hemoglobin 8.9, creatinine 3.39. Glucose 152. Lactate 2.4. Urinalysis not indicative of infection. Knee aspiration attempted at bedside by ER physician that was unsuccessful. Chest x-ray negative for acute infiltrate. Patient had IR arthrocentesis which revealed 20 mL of purulent fluid, workup was significant for 35,000 white blood cells, and monosodium urate crystals Assessment/Plan: SIRS -Present on admission, evidenced by temperature of 103, heart rate 132, respiratory rate of 30, lactic acid of 2.5. -Workup thus far has shown a negative chest x-ray and urinalysis. -He presents with a painful right knee, appearing swollen with localized erythema on physical examination. -Swollen, painful, right knee, status post arthrocentesis showing 35,000 white blood cells, and monosodium urate crystals, status post extensive arthroscopic to  provide meant and right knee lavaged, finding more consistent of widespread out-of-control gout with topheous crystals throughout knee and embedded in meniscus and articular cartilage. -We'll stop IV vancomycin and Zosyn 3/18-3/20, given negative Gram stain and cultures.   Severe gouty arthritis - painful, right knee, status post arthrocentesis showing 35,000 white blood cells, and monosodium urate crystals, status post extensive arthroscopic to provide meant and right knee lavaged, finding more consistent of widespread out-of-control gout with 2 patient was crystals throughout knee and embedded in meniscus and articular cartilage. - Patient reports left knee pain, right ankle pain, will increase IV Solu-Medrol for gouty arthritis. No colchicine or NSAIDs given his renal function. -Follow on synovial fluid for Gram stain, culture,  -Follow-up on blood cultures.   Stage IV chronic kidney disease. -Patient having baseline creatinine between 3.2 and 3.4 - will continue to monitor.   Acute encephalopathy. -Metabolic    Insulin-dependent diabetes mellitus -Continue Lantus 10 units subcutaneous daily at bedtime along with sliding scale coverage.  Anemia - Transfused 2 units packed red blood cell 3/18 - hemoglobin is 9.6 posttransfusion  Severe protein calorie malnutrition - Will start on supplement. Code Status: Full code Family Communication: Spoke with daughter over the phone 3/20 Disposition Plan: Anticipate discharge back to skilled nursing facility in 24-48 hours.   Consultants:  Orthopedic surgery  Procedures: - 3/19 extensive arthroscopic to provide meant and right knee lavaged, finding more consistent of widespread out-of-control gout with 2 patient was crystals throughout knee and embedded in meniscus and articular cartilage. - 2 units packed red blood cell transfusion 3/18 Antibiotics:  Vancomycin  Zosyn  Subjective: Complaints of right knee pain, as well as report  tenderness in left knee as well.  Objective: Filed Vitals:   02/22/15 8119  BP: 143/85  Pulse: 84  Temp: 98.2 F (36.8 C)  Resp: 16    Intake/Output Summary (Last 24 hours) at 02/22/15 1146 Last data filed at 02/22/15 1037  Gross per 24 hour  Intake 1352.66 ml  Output    800 ml  Net 552.66 ml   Filed Weights   02/19/15 2215 02/20/15 0500 02/21/15 0133  Weight: 57.7 kg (127 lb 3.3 oz) 55.7 kg (122 lb 12.7 oz) 56.1 kg (123 lb 10.9 oz)    Exam:   General:  Patient is awake and alert, can follow simple commands  Cardiovascular: Regular rate and rhythm, tachycardic normal S1-S2  Respiratory: Normal respiratory effort, lungs are clear to auscultation bilaterally no wheezing rhonchi or rales  Abdomen: Soft nontender nondistended  Musculoskeletal: Right knee wrapped in Ace status post surgery, improving left knee tenderness to palpation    Data Reviewed: Basic Metabolic Panel:  Recent Labs Lab 02/19/15 1720 02/19/15 2059 02/20/15 0345 02/22/15 0640  NA 135  --  135 135  K 4.7  --  3.9 4.5  CL 98  --  104 105  CO2 23  --  20 21  GLUCOSE 152*  --  131* 160*  BUN 46*  --  39* 30*  CREATININE 3.39* 3.40* 3.23* 2.92*  CALCIUM 8.6  --  7.9* 8.3*   Liver Function Tests:  Recent Labs Lab 02/19/15 1720 02/20/15 0345 02/22/15 0640  AST 28 28 23   ALT 18 14 18   ALKPHOS 121* 101 121*  BILITOT 0.5 0.7 0.8  PROT 6.8 5.6* 5.7*  ALBUMIN 2.0* 1.6* 1.4*   No results for input(s): LIPASE, AMYLASE in the last 168 hours.  Recent Labs Lab 02/19/15 2059  AMMONIA 26   CBC:  Recent Labs Lab 02/19/15 1617 02/19/15 2059 02/20/15 0345 02/22/15 0640  WBC 13.5* 10.3 11.6* 8.8  NEUTROABS 11.4*  --  10.1* 8.5*  HGB 8.9* 8.3* 7.2* 9.6*  HCT 29.0* 27.2* 22.6* 29.7*  MCV 94.8 94.8 94.6 89.2  PLT 313 262 235 180   Cardiac Enzymes: No results for input(s): CKTOTAL, CKMB, CKMBINDEX, TROPONINI in the last 168 hours. BNP (last 3 results)  Recent Labs  02/06/15 0820   BNP 1559.6*    ProBNP (last 3 results) No results for input(s): PROBNP in the last 8760 hours.  CBG:  Recent Labs Lab 02/21/15 1144 02/21/15 2102 02/22/15 0011 02/22/15 0404 02/22/15 0804  GLUCAP 134* 175* 185* 121* 148*    Recent Results (from the past 240 hour(s))  Blood Culture (routine x 2)     Status: None (Preliminary result)   Collection Time: 02/19/15  4:17 PM  Result Value Ref Range Status   Specimen Description BLOOD  Final   Special Requests NONE  Final   Culture   Final           BLOOD CULTURE RECEIVED NO GROWTH TO DATE CULTURE WILL BE HELD FOR 5 DAYS BEFORE ISSUING A FINAL NEGATIVE REPORT Performed at [facility]    Report Status PENDING  Incomplete  Urine culture     Status: None   Collection Time: 02/19/15  4:19 PM  Result Value Ref Range Status   Specimen Description URINE, RANDOM  Final   Special Requests NONE  Final   Colony Count NO GROWTH Performed at [facility]   Final   Culture NO GROWTH Performed at [facility]   Final   Report Status 02/21/2015 FINAL  Final  Blood Culture (routine x 2)  Status: None (Preliminary result)   Collection Time: 02/19/15  4:22 PM  Result Value Ref Range Status   Specimen Description BLOOD RIGHT HAND  Final   Special Requests BOTTLES DRAWN AEROBIC AND ANAEROBIC 4CC  Final   Culture   Final           BLOOD CULTURE RECEIVED NO GROWTH TO DATE CULTURE WILL BE HELD FOR 5 DAYS BEFORE ISSUING A FINAL NEGATIVE REPORT Performed at [facility]    Report Status PENDING  Incomplete  MRSA PCR Screening     Status: None   Collection Time: 02/19/15  9:45 PM  Result Value Ref Range Status   MRSA by PCR NEGATIVE NEGATIVE Final    Comment:        The GeneXpert MRSA Assay (FDA approved for NASAL specimens only), is one component of a comprehensive MRSA colonization surveillance program. It is not intended to diagnose MRSA infection nor to guide or monitor treatment for MRSA  infections.   Gram stain     Status: None   Collection Time: 02/20/15  5:06 PM  Result Value Ref Range Status   Specimen Description SYNOVIAL KNEE  Final   Special Requests NONE  Final   Gram Stain   Final    ABUNDANT WBC PRESENT, PREDOMINANTLY PMN NO ORGANISMS SEEN Gram Stain Report Called to,Read Back By and Verified With: WOODS,T. RN  BY MCCOY,N.    Report Status 02/20/2015 FINAL  Final  Body fluid culture     Status: None (Preliminary result)   Collection Time: 02/20/15  5:06 PM  Result Value Ref Range Status   Specimen Description SYNOVIAL KNEE  Final   Special Requests NONE  Final   Gram Stain   Final    ABUNDANT WBC PRESENT, PREDOMINANTLY PMN NO ORGANISMS SEEN Gram Stain Report Called to,Read Back By and Verified With: Gram Stain Report Called to,Read Back By and Verified With: WOODS T.@2005  BY MCCOY N. Performed by [facility] Performed at [facility]    Culture   Final    NO GROWTH 1 DAY Performed at [facility]    Report Status PENDING  Incomplete  Surgical pcr screen     Status: Abnormal   Collection Time: 02/21/15  6:27 AM  Result Value Ref Range Status   MRSA, PCR NEGATIVE NEGATIVE Final   Staphylococcus aureus POSITIVE (A) NEGATIVE Final    Comment:        The Xpert SA Assay (FDA approved for NASAL specimens in patients over 39 years of age), is one component of a comprehensive surveillance program.  Test performance has been validated by [facility] for patients greater than or equal to 36 year old. It is not intended to diagnose infection nor to guide or monitor treatment.      Studies: Dg Fluoro Guide Ndl Plc/bx  02/21/2015   CLINICAL DATA:  Redness and swelling and pain of the right knee. Sepsis.  EXAM: RIGHT KNEE ASPIRATION UNDER FLUOROSCOPY  FLUOROSCOPY TIME:  Fluoroscopy Time (in minutes and seconds): 0 minutes 13 seconds  Number of Acquired Images:  1 freeze frame  PROCEDURE: Overlying skin prepped with  Betadine, draped in the usual sterile fashion, and infiltrated locally with buffered Lidocaine. 18 gauge needle was inserted into the suprapatellar recess of the right knee and 20 cc of pus was aspirated.  Mr. Hendriks tolerated the procedure well.  IMPRESSION: Technically successful right knee aspiration under fluoroscopy. The patient's physician was immediately informed of the results.  Electronically Signed   By: Francene BoyersJames  Maxwell M.D.   On: 02/21/2015 11:21    Scheduled Meds: . allopurinol  200 mg Oral Daily  . benztropine  1 mg Oral Daily  . heparin  5,000 Units Subcutaneous 3 times per day  . insulin aspart  0-9 Units Subcutaneous 6 times per day  . insulin glargine  10 Units Subcutaneous QHS  . methylPREDNISolone (SOLU-MEDROL) injection  60 mg Intravenous Daily  . metoprolol tartrate  25 mg Oral BID  . sodium bicarbonate  1,300 mg Oral BID  . sodium chloride  3 mL Intravenous Q12H   Continuous Infusions: . sodium chloride 10 mL/hr at 02/21/15 [id number]    Active Problems:   SIRS (systemic inflammatory response syndrome)   Gouty arthritis   Anemia, iron deficiency   Acute encephalopathy   Chronic kidney disease (CKD), stage IV (severe)    Time spent: 35 minutes    Dinah Lupa  [facility] Pager [phone] If 7PM-7AM, please contact night-coverage at [email or web address], password [email or web address] 02/22/2015, 11:46 AM  LOS: 3 days

## 2015-02-22 NOTE — Progress Notes (Signed)
Notified MD that patient is on Clear Liquid diet, and request to advance if not contraindicated.

## 2015-02-23 LAB — BASIC METABOLIC PANEL
Anion gap: 10 (ref 5–15)
BUN: 37 mg/dL — AB (ref 6–23)
CO2: 22 mmol/L (ref 19–32)
CREATININE: 2.79 mg/dL — AB (ref 0.50–1.35)
Calcium: 8.1 mg/dL — ABNORMAL LOW (ref 8.4–10.5)
Chloride: 104 mmol/L (ref 96–112)
GFR calc Af Amer: 26 mL/min — ABNORMAL LOW (ref >90)
GFR calc non Af Amer: 23 mL/min — ABNORMAL LOW (ref >90)
Glucose, Bld: 207 mg/dL — ABNORMAL HIGH (ref 70–99)
Potassium: 3.8 mmol/L (ref 3.5–5.1)
Sodium: 136 mmol/L (ref 135–145)

## 2015-02-23 LAB — CBC
HEMATOCRIT: 27.8 % — AB (ref 39.0–52.0)
HEMOGLOBIN: 9 g/dL — AB (ref 13.0–17.0)
MCH: 28.8 pg (ref 26.0–34.0)
MCHC: 32.4 g/dL (ref 30.0–36.0)
MCV: 88.8 fL (ref 78.0–100.0)
Platelets: 181 10*3/uL (ref 150–400)
RBC: 3.13 MIL/uL — AB (ref 4.22–5.81)
RDW: 16.8 % — AB (ref 11.5–15.5)
WBC: 6.9 10*3/uL (ref 4.0–10.5)

## 2015-02-23 LAB — GLUCOSE, CAPILLARY
Glucose-Capillary: 183 mg/dL — ABNORMAL HIGH (ref 70–99)
Glucose-Capillary: 176 mg/dL — ABNORMAL HIGH (ref 70–99)

## 2015-02-23 MED ORDER — OXYCODONE HCL 5 MG PO TABS: 10.0000 mg | ORAL_TABLET | ORAL | Status: AC | PRN

## 2015-02-23 MED ORDER — INSULIN ASPART 100 UNIT/ML ~~LOC~~ SOLN: 0.0000 [IU] | Freq: Three times a day (TID) | SUBCUTANEOUS | Status: AC

## 2015-02-23 MED ORDER — PREDNISONE (PAK) 10 MG PO TABS: ORAL_TABLET | Freq: Every day | ORAL | Status: AC

## 2015-02-23 MED ORDER — PANTOPRAZOLE SODIUM 40 MG PO TBEC: 40.0000 mg | DELAYED_RELEASE_TABLET | Freq: Every day | ORAL | Status: AC

## 2015-02-23 NOTE — Discharge Summary (Addendum)
[name] Wise, 64 y.o., DOB May 28, 1951, MRN [id number]. Admission date: 02/19/2015 Discharge Date 02/23/2015 Primary MD Ralene Ok, MD Admitting Physician Floydene Flock, MD   PCP please follow on: - Check CBC, BMP within 3 days from discharge . - Patient is on tapering dose prednisone giving severe gout flare . - Unclear if patient is supposed to be on Haldol shots every 6 weeks or not ( as mentioned by his HCPOA), appears last shot was in January ,this does not appeare to be on his medication list, please address this issue .  Admission Diagnosis  Abdominal pain, generalized [R10.84] Knee pain [M25.569] Encephalopathy [G93.40] Knee pain, right [M25.561] Knee swelling, right [M25.461] Fever, unspecified fever cause [R50.9]  Discharge Diagnosis   Active Problems:   SIRS (systemic inflammatory response syndrome)   Gouty arthritis   Anemia, iron deficiency   Acute encephalopathy   Chronic kidney disease (CKD), stage IV (severe)      Past Medical History  Diagnosis Date  . Gout   . Hypertension   . Schizophrenia   . Tobacco abuse   . Diabetes mellitus without complication     Past Surgical History  Procedure Laterality Date  . None       Hospital Course See H&P, Labs, Consult and Test reports for all details in brief, patient was admitted for   Active Problems:   SIRS (systemic inflammatory response syndrome)   Gouty arthritis   Anemia, iron deficiency   Acute encephalopathy   Chronic kidney disease (CKD), stage IV (severe)  64 y.o. year old male with significant past medical history of schizophrenia, HTN, gout, IDDM, stage IV CKD, chronic diastolic heart failure presenting with sepsis, encephalopathy. Pt resident of local SNF. Per report, pt w/ noted temp 102.3. ? Rash as well as bilateral knee swelling. Patient noted to have been admitted for similar symptoms approximately 10 days ago. Was noted to have a fever as well as swollen right knee and encephalopathy. Patient had  overall symptomatic improvement with arthritis treatment. Did have 1 blood culture that grew out coag-negative staph. Was placed on steroid taper for gout Was otherwise a baseline by hospital discharge per report. Patient has somewhat limited English however primary complaints are abdominal and knee pain. Presented to the ER , heart rate in the 110s, respirations in the tens, blood pressure in the 120s to 190s, satting 99% on room air. White blood cell count 13.5, hemoglobin 8.9, creatinine 3.39. Glucose 152. Lactate 2.4. Urinalysis not indicative of infection. Knee aspiration attempted at bedside by ER physician that was unsuccessful. Chest x-ray negative for acute infiltrate. Patient had IR arthrocentesis which revealed 20 mL of purulent fluid, workup was significant for 35,000 white blood cells, and monosodium urate crystals, seen by orthopedic, and had on 3/19 extensive arthroscopic to provide meant and right knee lavaged, finding more consistent of widespread out-of-control gout with 2 patient was crystals throughout knee and embedded in meniscus and articular cartilage.  SIRS -Present on admission, evidenced by temperature of 103, heart rate 132, respiratory rate of 30, lactic acid of 2.5. -Workup  has shown a negative chest x-ray and urinalysis. -He presents with a painful right knee, appearing swollen with localized erythema on physical examination. -Swollen, painful, right knee on admission, status post arthrocentesis showing 35,000 white blood cells, and monosodium urate crystals, status post extensive arthroscopic to provide debridement and right knee lavaged, finding more consistent of widespread out-of-control gout with topheous crystals throughout knee and embedded in meniscus and articular cartilage. -Initially  IV vancomycin and Zosyn 3/18-3/20, was stopped given negative Gram stain and cultures.   Severe gouty arthritis - painful, right knee, status post arthrocentesis showing 35,000  white blood cells, and monosodium urate crystals, status post extensive arthroscopic to provide meant and right knee lavaged, finding more consistent of widespread out-of-control gout with 2 patient was crystals throughout knee and embedded in meniscus and articular cartilage. - Patient reports left knee pain, right ankle pain, on IV Solu-Medrol for gouty arthritis, transitioned to taper dose oral prednisone as an outpatient No colchicine or NSAIDs given his renal function. -Synovial fluid culture no growth to date -Blood cultures no growth to date  Stage IV chronic kidney disease. -Patient having baseline creatinine between 3.2 and 3.4 - Atenolol on discharge is 2.79  Acute encephalopathy. -Metabolic  - Resolved  Insulin-dependent diabetes mellitus -Continue Lantus 10 units subcutaneous daily at bedtime , along with insulin sliding scale given patient on prednisone.  Anemia - Transfused 2 units packed red blood cell 3/18 - hemoglobin is 9  at day of discharge.   Consultants:  Orthopedic surgery  Procedures: - 3/19 extensive arthroscopic to provide meant and right knee lavaged, finding more consistent of widespread out-of-control gout with 2 patient was crystals throughout knee and embedded in meniscus and articular cartilage. - 2 units packed red blood cell transfusion 3/18 Antibiotics:  Vancomycin 3/18-3/20  Zosyn 3/18-3/20   Significant Tests:  See full reports for all details   Ct Abdomen Pelvis Wo Contrast  02/19/2015   CLINICAL DATA:  Generalized abdominal pain.  EXAM: CT ABDOMEN AND PELVIS WITHOUT CONTRAST  TECHNIQUE: Multidetector CT imaging of the abdomen and pelvis was performed following the standard protocol without IV contrast.  COMPARISON:  CT scan of January 17, 2015.  FINDINGS: No significant abnormality seen in the visualized lung bases. No significant osseous abnormality is noted.  No gallstones are noted. No focal abnormality is noted in the liver, spleen  or pancreas on these unenhanced images. Adrenal glands and kidneys appear normal. No hydronephrosis or renal obstruction is noted. No renal or ureteral calculi are noted. 3.1 cm infrarenal abdominal aortic aneurysm is noted. There is no evidence of bowel obstruction. No abnormal fluid collection is noted. Urinary bladder appears normal. No significant adenopathy is noted. The appendix appears normal.  IMPRESSION: 3.1 cm infrarenal abdominal aortic aneurysm. Recommend followup by ultrasound in 3 years. This recommendation follows ACR consensus guidelines: White Paper of the ACR Incidental Findings Committee II on Vascular Findings. J Am Coll Radiol 2013; 10:789-794.  No other significant abnormality seen in the abdomen or pelvis.   Electronically Signed   By: Lupita Raider, M.D.   On: 02/19/2015 21:21   Dg Chest 2 View  02/03/2015   CLINICAL DATA:  Weakness.  Altered mental status.  EXAM: CHEST  2 VIEW  COMPARISON:  Single view of the chest 01/19/2015.  FINDINGS: Lung volumes are low with mild basilar atelectasis. There is cardiomegaly without edema. No pneumothorax or pleural effusion.  IMPRESSION: No acute finding in a low volume chest.  Cardiomegaly.   Electronically Signed   By: Drusilla Kanner M.D.   On: 02/03/2015 16:37   Dg Knee 1-2 Views Right  02/19/2015   CLINICAL DATA:  Acute onset of fever and sepsis. Generalized right knee pain and swelling. Initial encounter.  EXAM: RIGHT KNEE - 1-2 VIEW  COMPARISON:  Right knee radiographs performed 02/06/2015  FINDINGS: Minimal linear lucency at the superior pole of the patella may be artifactual in  nature, though a fracture of the patella cannot be entirely excluded. Evaluation is suboptimal due to limitations in positioning.  The joint spaces are grossly preserved. No significant degenerative change is seen; the patellofemoral joint is grossly unremarkable in appearance. A fabella is noted.  A small to moderate knee joint effusion is noted. Scattered  vascular calcifications are seen.  IMPRESSION: 1. Minimal linear lucency at the superior pole of the patella may be artifactual in nature, though a fracture of the patella cannot be entirely excluded. Would correlate for evidence of recent trauma. 2. Small to moderate knee joint effusion noted. 3. Scattered vascular calcifications seen.   Electronically Signed   By: Roanna RaiderJeffery  Chang M.D.   On: 02/19/2015 20:41   Dg Knee 1-2 Views Right  02/06/2015   CLINICAL DATA:  Right knee pain, history of gout  EXAM: RIGHT KNEE - 1-2 VIEW  COMPARISON:  01/17/2015  FINDINGS: Small joint effusion is again identified. Some generalized soft tissue swelling about the knee is seen. No acute fracture or dislocation is noted. Mild degenerative changes are seen.  IMPRESSION: The overall appearance is stable from the prior exam. No acute bony abnormality is seen.   Electronically Signed   By: Alcide CleverMark  Lukens M.D.   On: 02/06/2015 08:32   Ct Head Wo Contrast  02/19/2015   CLINICAL DATA:  Acute onset of altered mental status. Encephalopathy. Initial encounter.  EXAM: CT HEAD WITHOUT CONTRAST  TECHNIQUE: Contiguous axial images were obtained from the base of the skull through the vertex without intravenous contrast.  COMPARISON:  MRI of the brain and CT of the head performed 02/03/2015  FINDINGS: There is no evidence of acute infarction, mass lesion, or intra- or extra-axial hemorrhage on CT.  Prominence of the ventricles and sulci reflects moderate cortical volume loss. Cerebellar atrophy is noted. Diffuse periventricular and subcortical white matter change likely reflects small vessel ischemic microangiopathy. Small chronic lacunar infarcts are noted at the right basal ganglia.  The brainstem and fourth ventricle are within normal limits. The basal ganglia are unremarkable in appearance. The cerebral hemispheres demonstrate grossly normal gray-white differentiation. No mass effect or midline shift is seen.  There is no evidence of  fracture; visualized osseous structures are unremarkable in appearance. The orbits are within normal limits. The paranasal sinuses and mastoid air cells are well-aerated. No significant soft tissue abnormalities are seen.  IMPRESSION: 1. No acute intracranial pathology seen on CT. 2. Moderate cortical volume loss and diffuse small vessel ischemic microangiopathy. 3. Likely small chronic lacunar infarcts at the right basal ganglia.   Electronically Signed   By: Roanna RaiderJeffery  Chang M.D.   On: 02/19/2015 21:13   Ct Head Wo Contrast  02/03/2015   CLINICAL DATA:  Mental status changes.  Schizophrenia.  EXAM: CT HEAD WITHOUT CONTRAST  TECHNIQUE: Contiguous axial images were obtained from the base of the skull through the vertex without intravenous contrast.  COMPARISON:  None.  FINDINGS: Sinuses/Soft tissues: Hypoplastic frontal sinuses. Other paranasal sinuses and mastoid air cells are clear.  Intracranial: Moderate low density in the periventricular white matter likely related to small vessel disease. Mild cerebral atrophy for age. Ventriculomegaly is mild and felt to be secondary to cerebral atrophy. Dense atherosclerosis in the bilateral vertebral and carotid arteries. No mass lesion, hemorrhage, acute infarct, intra-axial, or extra-axial fluid collection.  IMPRESSION: 1.  No acute intracranial abnormality. 2.  Cerebral atrophy and small vessel ischemic change. 3. Atherosclerosis.   Electronically Signed   By: Hosie SpangleKyle  Talbot M.D.  On: 02/03/2015 16:44   Mr Brain Wo Contrast (neuro Protocol)  02/03/2015   CLINICAL DATA:  Initial evaluation for acute onset confusion, altered mental status, history of schizophrenia.  EXAM: MRI HEAD WITHOUT CONTRAST  TECHNIQUE: Multiplanar, multiecho pulse sequences of the brain and surrounding structures were obtained without intravenous contrast.  COMPARISON:  Prior CT from earlier the same day.  FINDINGS: Study is somewhat degraded by motion artifact.  Diffuse prominence of the CSF  containing spaces is compatible with generalized cerebral atrophy. Patchy and confluent T2/FLAIR hyperintensity within the periventricular and deep white matter both cerebral hemispheres noted, nonspecific, but most likely related to chronic small vessel ischemic changes.  No abnormal foci of restricted diffusion to suggest acute intracranial infarct. Tiny mildly intense focus of signal intensity seen at the midline at the anterior genu of corpus callosum favored to be artifactual in nature (series 4, image 33). Gray-white matter differentiation maintained. Normal intravascular flow voids are present. Small remote lacunar infarct present within the right basal ganglia.  No acute or chronic intracranial hemorrhage identified.  No mass lesion or midline shift. Mild ventricular prominence related global parenchymal volume loss present without hydrocephalus. No extra-axial fluid collection.  Craniocervical junction within normal limits. Pituitary gland is normal. No acute abnormality seen about the orbits.  Paranasal sinuses are grossly clear. There is mild scattered fluid density within the right mastoid air cells.  Bone marrow signal intensity within normal limits. Scalp soft tissues unremarkable.  IMPRESSION: 1. Motion degraded study. No acute intracranial infarct or other process identified. 2. Atrophy with moderate chronic microvascular ischemic disease.   Electronically Signed   By: Rise Mu M.D.   On: 02/03/2015 21:33   US Renal  02/04/2015   CLINICAL DATA:  Acute on chronic renal failure, history smoking, hypertension, altered mental status  EXAM: RENAL/URINARY TRACT ULTRASOUND COMPLETE  COMPARISON:  CT abdomen and pelvis 01/17/2015, renal ultrasound 01/15/2015  FINDINGS: Right Kidney:  Length: 10.0 cm. Cortical thinning. Increased cortical echogenicity. No mass, hydronephrosis or shadowing calcification.  Left Kidney:  Length: 10.1 cm. Cortical thinning. Increased cortical echogenicity. No mass,  hydronephrosis or shadowing calcification.  Bladder:  Slightly irregular posterior bladder wall favor trabeculation. Bladder normally distended without definite mass. Prostate gland not visualized.  IMPRESSION: Medical renal disease changes and cortical atrophy of both kidneys.  No gross evidence of renal mass or hydronephrosis.  Question trabeculation of bladder wall, which may be seen with chronic outlet obstruction.   Electronically Signed   By: Ulyses Southward M.D.   On: 02/04/2015 20:15   Dg Chest Port 1 View  02/19/2015   CLINICAL DATA:  Patient with fever and rash. History of schizophrenia.  EXAM: PORTABLE CHEST - 1 VIEW  COMPARISON:  02/06/2015  FINDINGS: Monitoring leads overlie the patient. Stable enlarged cardiac and mediastinal contours. Pulmonary vascular redistribution. Low lung volumes. Bibasilar heterogeneous opacities. No definite pleural effusion or pneumothorax.  IMPRESSION: Low lung volumes with basilar atelectasis.  Cardiomegaly without definite evidence for acute pulmonary edema.   Electronically Signed   By: Annia Belt M.D.   On: 02/19/2015 17:16   Dg Chest Port 1 View  02/06/2015   CLINICAL DATA:  CHF  EXAM: PORTABLE CHEST - 1 VIEW  COMPARISON:  02/05/2015  FINDINGS: Cardiac shadow is stable. The vascular congestion seen on the previous exam has improved slightly in the interval. Better aeration is noted bilaterally. No focal infiltrate is seen.  IMPRESSION: Resolving CHF.   Electronically Signed   By: Loraine Leriche  Lukens M.D.   On: 02/06/2015 08:32   Dg Chest Port 1 View  02/05/2015   CLINICAL DATA:  Fever and chest crackles  EXAM: PORTABLE CHEST - 1 VIEW  COMPARISON:  02/03/2015  FINDINGS: Chronic cardiomegaly. There is stable upper mediastinal widening. There is new diffuse interstitial opacity, especially prominent at the hila. No effusion or pneumothorax.  IMPRESSION: New pulmonary edema.   Electronically Signed   By: Marnee Spring M.D.   On: 02/05/2015 03:40   Dg Swallowing Func-speech  Pathology  02/04/2015    Objective Swallowing Evaluation:    Patient Details  Name: Rocklin Soderquist MRN: [id number] Date of Birth: [birth year]  Today's Date: 02/04/2015 Time: SLP Start Time (ACUTE ONLY): 1335-SLP Stop Time (ACUTE ONLY): 1354 SLP Time Calculation (min) (ACUTE ONLY): 19 min  Past Medical History:  Past Medical History  Diagnosis Date  . Gout   . Hypertension   . Schizophrenia   . Tobacco abuse    Past Surgical History:  Past Surgical History  Procedure Laterality Date  . None     HPI:  HPI: 64 yo male adm to [facility] with AMS- suspected UTI.  Pt with elevated  troponins, CKD stage IV, anemia.  PMH + for schizophrenia.  CT head  negative, MRI negative, CXR negative.  Pt failed RNSSS and SLP evaluation  ordered.    No Data Recorded  Assessment / Plan / Recommendation CHL IP CLINICAL IMPRESSIONS 02/04/2015  Dysphagia Diagnosis Moderate oral phase dysphagia;Severe oral phase  dysphagia;Moderate pharyngeal phase dysphagia  Clinical impression Moderately severe oropharyngeal dysphagia with  sensorimotor deficits.  Pt did not aspirate any consistency tested - trace  laryngeal penetration of thin noted that cleared with further swallows.   Severe delay in oral transiting (up to 12 seconds) noted with pt requiring  verbal cues to swallow -  delay was worse with increased viscocity.   Piecemealing noted which was effective.  Pt pharyngeal swallow  characterized by delay and weakness with resultant residuals without pt  sensation.  CUED dry swallows effective to decrease residuals.    Recommend pt consume full liquid diet initially with strict aspiration  precautions.  Using live video, educated pt to findings, recommendations.     Will follow for readiness for dietary advancement.  SLP set up oral  suction in pt's room to use prn.  Hopeful for pt swallow to improve during  acute stay given his good po tolerance during last admit and negative  MRI/CT.        CHL IP TREATMENT RECOMMENDATION 02/04/2015  Treatment Plan Recommendations  Therapy as outlined in treatment plan below      CHL IP DIET RECOMMENDATION 02/04/2015  Diet Recommendations Thin liquid  Liquid Administration via Cup  Medication Administration Crushed with puree  Compensations Slow rate;Small sips/bites  Postural Changes and/or Swallow Maneuvers Seated upright 90  degrees;Upright 30-60 min after meal           CHL IP FREQUENCY AND DURATION 02/04/2015  Speech Therapy Frequency (ACUTE ONLY) min 2x/week  Treatment Duration 2 weeks         CHL IP REASON FOR REFERRAL 02/04/2015  Reason for Referral Objectively evaluate swallowing function     CHL IP ORAL PHASE 02/04/2015                                      Oral - Nectar Teaspoon Pocketing in anterior sulcus;Holding of  bolus;Delayed oral transit  Oral - Nectar Cup Delayed oral transit;Weak lingual manipulation;Piecemeal  swallowing;Reduced posterior propulsion;Incomplete tongue to palate  contact;Lingual/palatal residue;Holding of bolus           Oral - Thin Teaspoon Delayed oral transit;Weak lingual  manipulation;Piecemeal swallowing;Reduced posterior propulsion;Incomplete  tongue to palate contact;Lingual/palatal residue;Holding of bolus  Oral - Thin Cup Weak lingual manipulation;Delayed oral transit;Piecemeal  swallowing;Reduced posterior propulsion;Incomplete tongue to palate  contact;Lingual/palatal residue;Holding of bolus  Oral - Thin Straw Weak lingual manipulation;Delayed oral transit;Piecemeal  swallowing;Reduced posterior propulsion;Incomplete tongue to palate  contact;Lingual/palatal residue;Holding of bolus  Oral - Thin Syringe (None)  Oral - Puree Weak lingual manipulation;Delayed oral transit;Piecemeal  swallowing;Reduced posterior propulsion;Pocketing in anterior  sulcus;Incomplete tongue to palate contact;Lingual/palatal residue;Holding  of bolus  Oral - Mechanical Soft Weak lingual manipulation;Delayed oral  transit;Piecemeal swallowing;Reduced posterior propulsion;Pocketing in  anterior sulcus;Incomplete tongue to palate  contact;Lingual/palatal  residue;Holding of bolus;Impaired mastication        Oral - Pill (None)  Oral Phase - Comment cues to swallow effective albeit pt with continued  delay, pt did not intiate swallow with first bolus of nectar thick barium-  required larger bolus to elicit transit *spillage into pharnx      CHL IP PHARYNGEAL PHASE 02/04/2015  Pharyngeal Phase Impaired                                Pharyngeal - Nectar Teaspoon Delayed swallow initiation;Premature spillage  to pyriform sinuses;Lateral channel residue  Penetration/Aspiration details (nectar teaspoon) (None)  Pharyngeal - Nectar Cup Delayed swallow initiation;Reduced pharyngeal  peristalsis;Reduced tongue base retraction;Premature spillage to  valleculae;Lateral channel residue                       Pharyngeal - Thin Teaspoon Delayed swallow initiation;Reduced tongue base  retraction;Reduced pharyngeal peristalsis;Lateral channel residue  Penetration/Aspiration details (thin teaspoon) (None)  Pharyngeal - Thin Cup Delayed swallow initiation;Reduced pharyngeal  peristalsis;Reduced tongue base retraction;Lateral channel residue  Penetration/Aspiration details (thin cup) (None)  Pharyngeal - Thin Straw Delayed swallow initiation;Reduced tongue base  retraction;Reduced pharyngeal peristalsis;Lateral channel residue           Pharyngeal - Puree Delayed swallow initiation;Reduced tongue base  retraction;Reduced pharyngeal peristalsis;Pharyngeal residue - valleculae  Penetration/Aspiration details (puree) (None)  Pharyngeal - Mechanical Soft Delayed swallow initiation;Reduced tongue  base retraction;Reduced pharyngeal peristalsis;Pharyngeal residue -  valleculae                       Pharyngeal Comment pt required verbal cues to conduct dry swallows to  faciliate clearance of pharynx - he does not sense residuals which will  increase his aspiration risk     CHL IP CERVICAL ESOPHAGEAL PHASE 02/04/2015  Cervical Esophageal Phase [facility]                                                       Donavan Burnet, MS [facility] SLP [phone]    Dg Fluoro Guide Ndl Plc/bx  02/21/2015   CLINICAL DATA:  Redness and swelling and pain of the right knee. Sepsis.  EXAM: RIGHT KNEE ASPIRATION UNDER FLUOROSCOPY  FLUOROSCOPY TIME:  Fluoroscopy Time (in minutes and seconds): 0 minutes 13 seconds  Number of Acquired Images:  1  freeze frame  PROCEDURE: Overlying skin prepped with Betadine, draped in the usual sterile fashion, and infiltrated locally with buffered Lidocaine. 18 gauge needle was inserted into the suprapatellar recess of the right knee and 20 cc of pus was aspirated.  Mr. Loveland tolerated the procedure well.  IMPRESSION: Technically successful right knee aspiration under fluoroscopy. The patient's physician was immediately informed of the results.   Electronically Signed   By: Francene Boyers M.D.   On: 02/21/2015 11:21     Today   Subjective:   Christifer Neyman today has no headache,no chest abdominal pain, good appetite, a febrile, no nausea or vomiting. Objective:   Blood pressure 162/85, pulse 88, temperature 98.6 F (37 C), temperature source Oral, resp. rate 18, height 5\' 3"  (1.6 m), weight 56.1 kg (123 lb 10.9 oz), SpO2 98 %.  Intake/Output Summary (Last 24 hours) at 02/23/15 1231 Last data filed at 02/23/15 1610  Gross per 24 hour  Intake    640 ml  Output   1000 ml  Net   -360 ml    Exam  General: Patient is awake and alert, can follow simple commands  Cardiovascular: Regular rate and rhythm, tachycardic normal S1-S2  Respiratory: Normal respiratory effort, lungs are clear to auscultation bilaterally no wheezing rhonchi or rales  Abdomen: Soft nontender nondistended  Musculoskeletal: Right knee wrapped in Ace status post arthroscopy. Good pedal pulses bilaterally.  Data Review   Cultures -   CBC w Diff:  Lab Results  Component Value Date   WBC 6.9 02/23/2015   HGB 9.0* 02/23/2015   HCT 27.8* 02/23/2015   HCT 25.3* 02/06/2015   PLT 181 02/23/2015    LYMPHOPCT 4* 02/22/2015   MONOPCT 0* 02/22/2015   EOSPCT 0 02/22/2015   BASOPCT 0 02/22/2015   CMP:  Lab Results  Component Value Date   NA 136 02/23/2015   K 3.8 02/23/2015   CL 104 02/23/2015   CO2 22 02/23/2015   BUN 37* 02/23/2015   CREATININE 2.79* 02/23/2015   PROT 5.7* 02/22/2015   ALBUMIN 1.4* 02/22/2015   BILITOT 0.8 02/22/2015   ALKPHOS 121* 02/22/2015   AST 23 02/22/2015   ALT 18 02/22/2015  .  Micro Results Recent Results (from the past 240 hour(s))  Blood Culture (routine x 2)     Status: None (Preliminary result)   Collection Time: 02/19/15  4:17 PM  Result Value Ref Range Status   Specimen Description BLOOD  Final   Special Requests NONE  Final   Culture   Final           BLOOD CULTURE RECEIVED NO GROWTH TO DATE CULTURE WILL BE HELD FOR 5 DAYS BEFORE ISSUING A FINAL NEGATIVE REPORT Performed at [facility]    Report Status PENDING  Incomplete  Urine culture     Status: None   Collection Time: 02/19/15  4:19 PM  Result Value Ref Range Status   Specimen Description URINE, RANDOM  Final   Special Requests NONE  Final   Colony Count NO GROWTH Performed at [facility]   Final   Culture NO GROWTH Performed at [facility]   Final   Report Status 02/21/2015 FINAL  Final  Blood Culture (routine x 2)     Status: None (Preliminary result)   Collection Time: 02/19/15  4:22 PM  Result Value Ref Range Status   Specimen Description BLOOD RIGHT HAND  Final   Special Requests BOTTLES DRAWN AEROBIC AND ANAEROBIC 4CC  Final   Culture   Final           BLOOD CULTURE RECEIVED NO GROWTH TO DATE CULTURE WILL BE HELD FOR 5 DAYS BEFORE ISSUING A FINAL NEGATIVE REPORT Performed at [facility]    Report Status PENDING  Incomplete  MRSA PCR Screening     Status: None   Collection Time: 02/19/15  9:45 PM  Result Value Ref Range Status   MRSA by PCR NEGATIVE NEGATIVE Final    Comment:        The GeneXpert MRSA Assay (FDA approved  for NASAL specimens only), is one component of a comprehensive MRSA colonization surveillance program. It is not intended to diagnose MRSA infection nor to guide or monitor treatment for MRSA infections.   Gram stain     Status: None   Collection Time: 02/20/15  5:06 PM  Result Value Ref Range Status   Specimen Description SYNOVIAL KNEE  Final   Special Requests NONE  Final   Gram Stain   Final    ABUNDANT WBC PRESENT, PREDOMINANTLY PMN NO ORGANISMS SEEN Gram Stain Report Called to,Read Back By and Verified With: WOODS,T. RN  BY MCCOY,N.    Report Status 02/20/2015 FINAL  Final  Body fluid culture     Status: None (Preliminary result)   Collection Time: 02/20/15  5:06 PM  Result Value Ref Range Status   Specimen Description SYNOVIAL KNEE  Final   Special Requests NONE  Final   Gram Stain   Final    ABUNDANT WBC PRESENT, PREDOMINANTLY PMN NO ORGANISMS SEEN Gram Stain Report Called to,Read Back By and Verified With: Gram Stain Report Called to,Read Back By and Verified With: WOODS T.@2005  BY MCCOY N. Performed by [facility] Performed at [facility]    Culture   Final    NO GROWTH 1 DAY Performed at [facility]    Report Status PENDING  Incomplete  Surgical pcr screen     Status: Abnormal   Collection Time: 02/21/15  6:27 AM  Result Value Ref Range Status   MRSA, PCR NEGATIVE NEGATIVE Final   Staphylococcus aureus POSITIVE (A) NEGATIVE Final    Comment:        The Xpert SA Assay (FDA approved for NASAL specimens in patients over 20 years of age), is one component of a comprehensive surveillance program.  Test performance has been validated by [facility] for patients greater than or equal to 75 year old. It is not intended to diagnose infection nor to guide or monitor treatment.      Discharge Instructions          Follow-up Information    Follow up with [facility], MD. Schedule an appointment as soon as possible for a  visit in 1 week.   Specialty:  Internal Medicine   Why:  Posthospitalization follow-up for gout.   Contact information:   [address] [place] [address] [phone]       Discharge Medications     Medication List    STOP taking these medications        insulin lispro 100 UNIT/ML injection  Commonly known as:  HUMALOG     oxyCODONE-acetaminophen 5-325 MG per tablet  Commonly known as:  PERCOCET/ROXICET     predniSONE 50 MG tablet  Commonly known as:  DELTASONE  Replaced by:  predniSONE 10 MG tablet      TAKE these medications        allopurinol 100 MG  tablet  Commonly known as:  ZYLOPRIM  Take 2 tablets (200 mg total) by mouth daily. Start on 02/16/15     aspirin 325 MG EC tablet  Take 1 tablet (325 mg total) by mouth daily.     benztropine 1 MG tablet  Commonly known as:  COGENTIN  Take 1 tablet by mouth daily.     cyanocobalamin 500 MCG tablet  Take 1 tablet (500 mcg total) by mouth daily.     ferrous sulfate 325 (65 FE) MG tablet  Take 1 tablet (325 mg total) by mouth 2 (two) times daily with a meal.     folic acid 1 MG tablet  Commonly known as:  FOLVITE  Take 1 tablet (1 mg total) by mouth daily.     insulin aspart 100 UNIT/ML injection  Commonly known as:  novoLOG  Inject 0-9 Units into the skin 3 (three) times daily with meals.     insulin glargine 100 UNIT/ML injection  Commonly known as:  LANTUS  Inject 10 Units into the skin at bedtime.     metoprolol tartrate 25 MG tablet  Commonly known as:  LOPRESSOR  Take 1 tablet (25 mg total) by mouth 2 (two) times daily.     oxyCODONE 5 MG immediate release tablet  Commonly known as:  Oxy IR/ROXICODONE  Take 2 tablets (10 mg total) by mouth every 4 (four) hours as needed for severe pain (Hopd for sedation, confusion, or respiratory distress.).     pantoprazole 40 MG tablet  Commonly known as:  PROTONIX  Take 1 tablet (40 mg total) by mouth daily.     predniSONE 10 MG tablet  Commonly known  as:  STERAPRED UNI-PAK  Take by mouth daily. Please take 5 tablets (50 mg) oral 2 days, then 4 tablets (40 mg) oral 2 days, then 3 tablets (30 mg) oral 2 days, then 1 tablet (10 mg) oral time 2 days then stop.  Start taking on:  02/24/2015     probenecid 500 MG tablet  Commonly known as:  BENEMID  Take 500 mg by mouth 2 (two) times daily.     sitaGLIPtin 25 MG tablet  Commonly known as:  JANUVIA  Take 1 tablet (25 mg total) by mouth daily.     sodium bicarbonate 650 MG tablet  Take 1,300 mg by mouth 2 (two) times daily.         Total Time in preparing paper work, data evaluation and todays exam - 35 minutes  Willisha Sligar M.D on 02/23/2015 at 12:31 PM  [facility] Office  [phone]

## 2015-02-23 NOTE — Discharge Instructions (Signed)
Follow with Primary MD Ralene OkMOREIRA,ROY, MD in 7 days   Get CBC, CMP, 2 view Chest X ray checked  by Primary MD next visit.    Activity: As tolerated with Full fall precautions use walker/cane & assistance as needed Up with therapy  WBAT RLE Dry dressing change prn  Disposition snf   Diet: Heart Healthy , Barbara hydrate modified, with feeding assistance and aspiration precautions as needed.  For Heart failure patients - Check your Weight same time everyday, if you gain over 2 pounds, or you develop in leg swelling, experience more shortness of breath or chest pain, call your Primary MD immediately. Follow Cardiac Low Salt Diet and 1.5 lit/day fluid restriction.   On your next visit with your primary care physician please Get Medicines reviewed and adjusted.   Please request your Prim.MD to go over all Hospital Tests and Procedure/Radiological results at the follow up, please get all Hospital records sent to your Prim MD by signing hospital release before you go home.   If you experience worsening of your admission symptoms, develop shortness of breath, life threatening emergency, suicidal or homicidal thoughts you must seek medical attention immediately by calling 911 or calling your MD immediately  if symptoms less severe.  You Must read complete instructions/literature along with all the possible adverse reactions/side effects for all the Medicines you take and that have been prescribed to you. Take any new Medicines after you have completely understood and accpet all the possible adverse reactions/side effects.   Do not drive, operating heavy machinery, perform activities at heights, swimming or participation in water activities or provide baby sitting services if your were admitted for syncope or siezures until you have seen by Primary MD or a Neurologist and advised to do so again.  Do not drive when taking Pain medications.    Do not take more than prescribed Pain, Sleep and  Anxiety Medications  Special Instructions: If you have smoked or chewed Tobacco  in the last 2 yrs please stop smoking, stop any regular Alcohol  and or any Recreational drug use.  Wear Seat belts while driving.   Please note  You were cared for by a hospitalist during your hospital stay. If you have any questions about your discharge medications or the care you received while you were in the hospital after you are discharged, you can call the unit and asked to speak with the hospitalist on call if the hospitalist that took care of you is not available. Once you are discharged, your primary care physician will handle any further medical issues. Please note that NO REFILLS for any discharge medications will be authorized once you are discharged, as it is imperative that you return to your primary care physician (or establish a relationship with a primary care physician if you do not have one) for your aftercare needs so that they can reassess your need for medications and monitor your lab values.

## 2015-02-23 NOTE — Progress Notes (Signed)
[facility].  Tele removed, skin intact, patient to be sent with bone foam and condom cath for transport

## 2015-02-23 NOTE — Care Management Note (Signed)
    Page 1 of 1   02/23/2015     5:51:06 PM CARE MANAGEMENT NOTE 02/23/2015  Patient:  [name] Wise,[name] Wise   Account Number:  [id number]  Date Initiated:  02/20/2015  Documentation initiated by:  DAVIS,RHONDA  Subjective/Objective Assessment:   Sepsis/Septic joint versus acute gout/Stage IV chronic kidney disease/Rash/Insulin-dependent diabetes mellitus     Action/Plan:   [facility]     Anticipated DC Date:  02/23/2015   Anticipated DC Plan:  SKILLED NURSING FACILITY  In-house referral  Clinical Social Worker      DC Planning Services  CM consult      [facility]  [facility]   Choice offered to / List presented to:  NA           Status of service:  Completed, signed off Medicare Important Message given?  YES (If response is "NO", the following Medicare IM given date fields will be blank) Date Medicare IM given:  02/23/2015 Medicare IM given by:  Letha CapeAYLOR,Lauralee Waters Date Additional Medicare IM given:   Additional Medicare IM given by:    Discharge Disposition:  SKILLED NURSING FACILITY  Per UR Regulation:  Reviewed for med. necessity/level of care/duration of stay  If discussed at Long Length of Stay Meetings, dates discussed:    Comments:  02/23/15 1747 Letha Capeeborah Maresha Anastos RN, BSN [phone] patient dc to snf today.   February 20, 2015/Rhonda L. Earlene Plateravis, RN, BSN, CCM. Case Management [employer] [phone] No discharge needs present of time of review.

## 2015-02-23 NOTE — Clinical Social Work Note (Signed)
Per MD patient ready to [facility]. RN, patient/family Rande Lawman(Mary Wakeman), and facility notified of patient's DC. RN given number for report. DC packet on patient's chart. Ambulance transport requested for patient for 2:30pm. CSW signing off at this time.   Roddie McBryant Anadalay Macdonell MSW, [place] Lake[place]CSWA, [place], [phone]

## 2015-02-23 NOTE — Progress Notes (Signed)
Medicare Important Message given?  YES (If response is "NO", the following Medicare IM given date fields will be blank) Date Medicare IM given:  02/23/15 Medicare IM given by:  Marketa Midkiff 

## 2015-02-23 NOTE — Progress Notes (Signed)
     Subjective:  POD #2 I/D right knee. Patient reports pain as mild.  Resting comfortably in bed.  Objective:   VITALS:   Filed Vitals:   02/23/15 0606 02/23/15 1054 02/23/15 1055 02/23/15 1418  BP: 149/85 162/85 162/85 157/90  Pulse: 76  88   Temp: 98.6 F (37 C)   98 F (36.7 C)  TempSrc: Oral   Oral  Resp: 18   16  Height:      Weight:      SpO2: 98%   100%    Neurologically intact ABD soft Neurovascular intact Sensation intact distally Intact pulses distally Incision: dressing C/D/I   Lab Results  Component Value Date   WBC 6.9 02/23/2015   HGB 9.0* 02/23/2015   HCT 27.8* 02/23/2015   MCV 88.8 02/23/2015   PLT 181 02/23/2015   BMET    Component Value Date/Time   NA 136 02/23/2015 0601   K 3.8 02/23/2015 0601   CL 104 02/23/2015 0601   CO2 22 02/23/2015 0601   GLUCOSE 207* 02/23/2015 0601   BUN 37* 02/23/2015 0601   CREATININE 2.79* 02/23/2015 0601   CALCIUM 8.1* 02/23/2015 0601   GFRNONAA 23* 02/23/2015 0601   GFRAA 26* 02/23/2015 0601     Assessment/Plan: 2 Days Post-Op   Active Problems:   Anemia, iron deficiency   Acute encephalopathy   Chronic kidney disease (CKD), stage IV (severe)   SIRS (systemic inflammatory response syndrome)   Gouty arthritis   Up with therapy Weight bearing as tolerated in the right leg. Continue gout management per medicine Stable from ortho standpoint.  Will sign off.  Please call with any question or concerns.   Shamya Macfadden Hilda LiasMarie 02/23/2015, 3:39 PM Cell [phone]

## 2015-02-23 NOTE — Progress Notes (Signed)
CARE MANAGEMENT NOTE 02/23/2015  Patient:  [name] Wise,[name] Wise   Account Number:  [id number]  Date Initiated:  02/20/2015  Documentation initiated by:  DAVIS,RHONDA  Subjective/Objective Assessment:   Sepsis/Septic joint versus acute gout/Stage IV chronic kidney disease/Rash/Insulin-dependent diabetes mellitus     Action[facility]Plan:   [facility]     Anticipated DC Date:  02/23/2015   Anticipated DC Plan:  SKILLED NURSING FACILITY  In-house referral  Clinical Social Worker      DC Planning Services  CM consult      [facility]  NA   Choice offered to / List presented to:  NA           Status of service:  Completed, signed off Medicare Important Message given?  YES (If response is "NO", the following Medicare IM given date fields will be blank) Date Medicare IM given:  02/23/2015 Medicare IM given by:  Letha CapeAYLOR,DEBORAH Date Additional Medicare IM given:   Additional Medicare IM given by:    Discharge Disposition:  SKILLED NURSING FACILITY  Per UR Regulation:  Reviewed for med. necessity/level of care/duration of stay  If discussed at Long Length of Stay Meetings, dates discussed:    Comments:  February 20, 2015/Rhonda L. Earlene Plateravis, RN, BSN, CCM. Case Management [employer] [phone] No discharge needs present of time of review.

## 2015-02-23 NOTE — Progress Notes (Signed)
Orthopedic Tech Progress Note Patient Details:  [name] Wise 23-Jul-1951 [id number] Bone foam applied per care orders Ortho Devices Type of Ortho Device: Other (comment) Ortho Device/Splint Location: Bone Foam; RLE Ortho Device/Splint Interventions: Application   Asia R Thompson 02/23/2015, 2:17 PM

## 2015-02-24 ENCOUNTER — Encounter (HOSPITAL_COMMUNITY): Payer: Self-pay | Admitting: Orthopedic Surgery

## 2015-02-24 LAB — BODY FLUID CULTURE: CULTURE: NO GROWTH

## 2015-02-24 NOTE — Op Note (Signed)
[name], [name]                   ACCOUNT NO.:  [id number]  MEDICAL RECORD NO.:  [id number]  LOCATION:                                 FACILITY:  PHYSICIAN:  Loreta Aveaniel F. Murphy, M.D. DATE OF BIRTH:  [birth year]  DATE OF PROCEDURE:  02/19/2015 DATE OF DISCHARGE:  02/23/2015                              OPERATIVE REPORT   PREOPERATIVE DIAGNOSIS:  Possible septic arthritis, right knee.  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  Extensive uric acid debris, tophaceous deposits throughout the knee, imbedded also in articular cartilage of menisci.  Degenerative tearing of the medial and lateral meniscus. Diffuse grade 2 changes with some grade 4 changes in the inferior pole of the patella.  No obvious pus but just debris from gouty deposits.  PROCEDURE:  Right knee examined under anesthesia and arthroscopy. Extensive debridement and lavage.  Removal of as much gouty material as possible.  Partial medial and lateral meniscectomy with tricompartmental chondroplasty.  SURGEON:  Loreta Aveaniel F. Murphy, M.D.  ASSISTANT:  Mikey KirschnerLindsey Stanberry, P.A.  ANESTHESIA:  General.  BLOOD LOSS:  Minimal.  SPECIMENS:  None.  CULTURES:  None.  COMPLICATIONS:  None.  DRESSING:  Soft and compressive.  TOURNIQUET:  Not employed.  PROCEDURE:  The patient was brought to the operating room, placed on the operating table in supine position.  After adequate anesthesia had been obtained, leg holder applied.  Leg was prepped and draped in usual sterile fashion.  Two portals, one each medial and lateral parapatellar. Arthroscope was induced.  Medially encountering this very cloudy fluid that could be mistaken for actual plus.  This actually turned out to be extensive exuberant deposits of uric acid and crystals throughout the knee.  All of the free material completely cleared out including synovectomy to  clean it as much as possible.  This was imbedded in  all articular cartilage and both menisci.  Cruciate ligaments intact. Tricompartmental chondroplasty.  Debridement of degenerative tearing posterior third of the medial meniscus and then removed most of the anterior half lateral meniscus that was torn up and degenerative.  At completion, I then did a thorough lavage with almost 9 L of saline. Previous cultures had already been obtained,  and I did not repeat these.  Also his previous aspirin had revealed positive crystals, although there was no need to repeat that.  After thorough lavage, instruments and fluids were removed.  Portals were closed with nylon.  Sterile compressive dressing applied.  Anesthesia  reversed.  Brought to the recovery room. Tolerated the surgery well.  No complications.     Loreta Aveaniel F. Murphy, M.D.     DFM/MEDQ  D:  02/23/2015  T:  02/24/2015  Job:  [id number]

## 2015-02-25 LAB — CULTURE, BLOOD (ROUTINE X 2)
Culture: NO GROWTH
Culture: NO GROWTH

## 2015-03-02 ENCOUNTER — Non-Acute Institutional Stay (SKILLED_NURSING_FACILITY): Payer: Medicare Other | Admitting: Internal Medicine

## 2015-03-02 DIAGNOSIS — M1009 Idiopathic gout, multiple sites: Secondary | ICD-10-CM | POA: Diagnosis not present

## 2015-03-02 DIAGNOSIS — F200 Paranoid schizophrenia: Secondary | ICD-10-CM | POA: Diagnosis not present

## 2015-03-02 DIAGNOSIS — N189 Chronic kidney disease, unspecified: Secondary | ICD-10-CM

## 2015-03-02 DIAGNOSIS — E1122 Type 2 diabetes mellitus with diabetic chronic kidney disease: Secondary | ICD-10-CM | POA: Diagnosis not present

## 2015-03-02 DIAGNOSIS — A419 Sepsis, unspecified organism: Secondary | ICD-10-CM | POA: Diagnosis not present

## 2015-03-02 DIAGNOSIS — D509 Iron deficiency anemia, unspecified: Secondary | ICD-10-CM

## 2015-03-02 DIAGNOSIS — R6510 Systemic inflammatory response syndrome (SIRS) of non-infectious origin without acute organ dysfunction: Secondary | ICD-10-CM

## 2015-03-02 DIAGNOSIS — I10 Essential (primary) hypertension: Secondary | ICD-10-CM | POA: Diagnosis not present

## 2015-03-02 DIAGNOSIS — N184 Chronic kidney disease, stage 4 (severe): Secondary | ICD-10-CM | POA: Diagnosis not present

## 2015-03-02 DIAGNOSIS — M109 Gout, unspecified: Secondary | ICD-10-CM

## 2015-03-09 ENCOUNTER — Encounter: Payer: Self-pay | Admitting: Internal Medicine

## 2015-03-09 DIAGNOSIS — D509 Iron deficiency anemia, unspecified: Secondary | ICD-10-CM

## 2015-03-09 DIAGNOSIS — N184 Chronic kidney disease, stage 4 (severe): Secondary | ICD-10-CM

## 2015-03-09 DIAGNOSIS — R6510 Systemic inflammatory response syndrome (SIRS) of non-infectious origin without acute organ dysfunction: Secondary | ICD-10-CM

## 2015-03-09 DIAGNOSIS — I10 Essential (primary) hypertension: Secondary | ICD-10-CM

## 2015-03-09 DIAGNOSIS — M109 Gout, unspecified: Secondary | ICD-10-CM

## 2015-03-09 DIAGNOSIS — F200 Paranoid schizophrenia: Secondary | ICD-10-CM

## 2015-03-09 NOTE — Progress Notes (Signed)
Patient ID: [name] Wise, male   DOB: October 30, 1951, 64 y.o.   MRN: [id number]    HISTORY AND PHYSICAL  03/02/15  Location:    [facility]   Place of Service:   SNF  Extended Emergency Contact Information Primary Emergency Contact: Eulis Foster [place] Home Phone: [phone] Relation: Friend Secondary Emergency Contact: Jilda Roche [place] Home Phone: [phone] Relation: Daughter  Advanced Directive information  DNR/MOST FORM ON CHART  Chief Complaint  Patient presents with  . Readmit To SNF    gouty arthritis, SIRS, Fe deficiency anemia, acute encephalopathy, CKD, schizophrenia, DM II    HPI:  64 yo male seen today for readmission into SNF following hospital stay for right knee gouty arthritis. Hospital records reviewed. He is s/p arthrocentesis and exploratory surgery on 3/19th. 70 mls purulent fluid removed from knee by arthrocentesis by IR. He then went for an extensive procedure on March 19th where right knee was lavaged and extensive urate crystals noted. Crystals were embedded into mensicus and the articular cartilage. He also rec'd 2 units PRBCs dueto anemia. Blood cx x 1 revealed coag neg staph.  Today, he reports right knee pain controlled with meds. Appetite is ok. He is sleeping well. No recent falls. No nursing issues.  He continues to receive regular infusions of haldol for paranoid schizophrenia  CBGs reveal no low BS reactions. He takes oral meds and insulin.   Past Medical History  Diagnosis Date  . Gout   . Hypertension   . Schizophrenia   . Tobacco abuse   . Diabetes mellitus without complication     Past Surgical History  Procedure Laterality Date  . None    . Knee arthroscopy Right 02/21/2015    Procedure: ARTHROSCOPY KNEE;  Surgeon: Mckinley Jewel, MD;  Location: [facility] OR;  Service: Orthopedics;  Laterality: Right;    Patient Care Team: Ralene Ok, MD as PCP - General (Internal  Medicine)  History   Social History  . Marital Status: Divorced    Spouse Name: N/A  . Number of Children: N/A  . Years of Education: N/A   Occupational History  . Not on file.   Social History Main Topics  . Smoking status: Current Every Day Smoker    Types: Cigarettes  . Smokeless tobacco: Not on file  . Alcohol Use: No  . Drug Use: No  . Sexual Activity: Yes   Other Topics Concern  . Not on file   Social History Narrative     reports that he has been smoking Cigarettes.  He does not have any smokeless tobacco history on file. He reports that he does not drink alcohol or use illicit drugs.  Family History  Problem Relation Age of Onset  . Hypertension Mother   . Gout Father    No family status information on file.    Immunization History  Administered Date(s) Administered  . Pneumococcal Polysaccharide-23 01/15/2015    No Known Allergies  Medications: Patient's Medications  New Prescriptions   No medications on file  Previous Medications   ALLOPURINOL (ZYLOPRIM) 100 MG TABLET    Take 2 tablets (200 mg total) by mouth daily. Start on 02/16/15   ASPIRIN EC 325 MG EC TABLET    Take 1 tablet (325 mg total) by mouth daily.   BENZTROPINE (COGENTIN) 1 MG TABLET    Take 1 tablet by mouth daily.   FERROUS SULFATE 325 (65 FE) MG TABLET    Take 1 tablet (  325 mg total) by mouth 2 (two) times daily with a meal.   FOLIC ACID (FOLVITE) 1 MG TABLET    Take 1 tablet (1 mg total) by mouth daily.   INSULIN ASPART (NOVOLOG) 100 UNIT/ML INJECTION    Inject 0-9 Units into the skin 3 (three) times daily with meals.   INSULIN GLARGINE (LANTUS) 100 UNIT/ML INJECTION    Inject 10 Units into the skin at bedtime.   METOPROLOL TARTRATE (LOPRESSOR) 25 MG TABLET    Take 1 tablet (25 mg total) by mouth 2 (two) times daily.   OXYCODONE (OXY IR/ROXICODONE) 5 MG IMMEDIATE RELEASE TABLET    Take 2 tablets (10 mg total) by mouth every 4 (four) hours as needed for severe pain (Hopd for sedation,  confusion, or respiratory distress.).   PANTOPRAZOLE (PROTONIX) 40 MG TABLET    Take 1 tablet (40 mg total) by mouth daily.   PROBENECID (BENEMID) 500 MG TABLET    Take 500 mg by mouth 2 (two) times daily.   SITAGLIPTIN (JANUVIA) 25 MG TABLET    Take 1 tablet (25 mg total) by mouth daily.   SODIUM BICARBONATE 650 MG TABLET    Take 1,300 mg by mouth 2 (two) times daily.   VITAMIN B-12 500 MCG TABLET    Take 1 tablet (500 mcg total) by mouth daily.  Modified Medications   No medications on file  Discontinued Medications   No medications on file    Review of Systems  Unable to perform ROS: Psychiatric disorder    Filed Vitals:   03/02/15 1649  BP: 110/77  Pulse: 64  Temp: 97.3 F (36.3 C)  SpO2: 97%   There is no weight on file to calculate BMI.  Physical Exam  Constitutional: He appears well-developed. He appears ill.  Frail appearing in NAD   HENT:  Mouth/Throat: Oropharynx is clear and moist. Mucous membranes are dry.  Eyes: Pupils are equal, round, and reactive to light. No scleral icterus.  Neck: Neck supple. No thyromegaly present.  Cardiovascular: Normal rate, regular rhythm and intact distal pulses.  Exam reveals no gallop and no friction rub.   Murmur heard.  Systolic murmur is present with a grade of 1/6  No carotid bruit b/l; no distal LE swelling  Pulmonary/Chest: Effort normal and breath sounds normal. He has no wheezes. He has no rales. He exhibits no tenderness.  Abdominal: Soft. Bowel sounds are normal. He exhibits no distension, no abdominal bruit, no pulsatile midline mass and no mass. There is no tenderness. There is no rebound and no guarding.  Musculoskeletal: He exhibits edema and tenderness.       Right knee: He exhibits decreased range of motion (reduced flexion), swelling and erythema. He exhibits no effusion, no deformity, no bony tenderness and normal meniscus.       Legs: R>L knee swelling  Lymphadenopathy:    He has no cervical adenopathy.   Neurological: He is alert.  Skin: Skin is warm and dry. No rash noted.  Psychiatric: His behavior is normal. Judgment normal. His mood appears anxious. He is not agitated. Thought content is paranoid.     Labs reviewed: Admission on 02/19/2015, Discharged on 02/23/2015  No results displayed because visit has over 200 results.     CBC Latest Ref Rng 02/23/2015 02/22/2015 02/20/2015  WBC 4.0 - 10.5 K/uL 6.9 8.8 11.6(H)  Hemoglobin 13.0 - 17.0 g/dL 9.0(L) 9.6(L) 7.2(L)  Hematocrit 39.0 - 52.0 % 27.8(L) 29.7(L) 22.6(L)  Platelets 150 - 400 K/uL  181 180 235    CMP Latest Ref Rng 02/23/2015 02/22/2015 02/20/2015  Glucose 70 - 99 mg/dL 161(W) 960(A) 540(J)  BUN 6 - 23 mg/dL 81(X) 91(Y) 78(G)  Creatinine 0.50 - 1.35 mg/dL 9.56(O) 1.30(Q) 6.57(Q)  Sodium 135 - 145 mmol/L 136 135 135  Potassium 3.5 - 5.1 mmol/L 3.8 4.5 3.9  Chloride 96 - 112 mmol/L 104 105 104  CO2 19 - 32 mmol/L Calcium 8.4 - 10.5 mg/dL 8.1(L) 8.3(L) 7.9(L)  Total Protein 6.0 - 8.3 g/dL - 5.7(L) 5.6(L)  Total Bilirubin 0.3 - 1.2 mg/dL - 0.8 0.7  Alkaline Phos 39 - 117 U/L - 121(H) 101  AST 0 - 37 U/L - 23 28  ALT 0 - 53 U/L - 18 14      Ct Abdomen Pelvis Wo Contrast  02/19/2015   CLINICAL DATA:  Generalized abdominal pain.  EXAM: CT ABDOMEN AND PELVIS WITHOUT CONTRAST  TECHNIQUE: Multidetector CT imaging of the abdomen and pelvis was performed following the standard protocol without IV contrast.  COMPARISON:  CT scan of January 17, 2015.  FINDINGS: No significant abnormality seen in the visualized lung bases. No significant osseous abnormality is noted.  No gallstones are noted. No focal abnormality is noted in the liver, spleen or pancreas on these unenhanced images. Adrenal glands and kidneys appear normal. No hydronephrosis or renal obstruction is noted. No renal or ureteral calculi are noted. 3.1 cm infrarenal abdominal aortic aneurysm is noted. There is no evidence of bowel obstruction. No abnormal fluid  collection is noted. Urinary bladder appears normal. No significant adenopathy is noted. The appendix appears normal.  IMPRESSION: 3.1 cm infrarenal abdominal aortic aneurysm. Recommend followup by ultrasound in 3 years. This recommendation follows ACR consensus guidelines: White Paper of the ACR Incidental Findings Committee II on Vascular Findings. J Am Coll Radiol 2013; 10:789-794.  No other significant abnormality seen in the abdomen or pelvis.   Electronically Signed   By: Lupita Raider, M.D.   On: 02/19/2015 21:21   Dg Knee 1-2 Views Right  02/19/2015   CLINICAL DATA:  Acute onset of fever and sepsis. Generalized right knee pain and swelling. Initial encounter.  EXAM: RIGHT KNEE - 1-2 VIEW  COMPARISON:  Right knee radiographs performed 02/06/2015  FINDINGS: Minimal linear lucency at the superior pole of the patella may be artifactual in nature, though a fracture of the patella cannot be entirely excluded. Evaluation is suboptimal due to limitations in positioning.  The joint spaces are grossly preserved. No significant degenerative change is seen; the patellofemoral joint is grossly unremarkable in appearance. A fabella is noted.  A small to moderate knee joint effusion is noted. Scattered vascular calcifications are seen.  IMPRESSION: 1. Minimal linear lucency at the superior pole of the patella may be artifactual in nature, though a fracture of the patella cannot be entirely excluded. Would correlate for evidence of recent trauma. 2. Small to moderate knee joint effusion noted. 3. Scattered vascular calcifications seen.   Electronically Signed   By: Roanna Raider M.D.   On: 02/19/2015 20:41   Ct Head Wo Contrast  02/19/2015   CLINICAL DATA:  Acute onset of altered mental status. Encephalopathy. Initial encounter.  EXAM: CT HEAD WITHOUT CONTRAST  TECHNIQUE: Contiguous axial images were obtained from the base of the skull through the vertex without intravenous contrast.  COMPARISON:  MRI of the brain  and CT of the head performed 02/03/2015  FINDINGS: There is no evidence of acute  infarction, mass lesion, or intra- or extra-axial hemorrhage on CT.  Prominence of the ventricles and sulci reflects moderate cortical volume loss. Cerebellar atrophy is noted. Diffuse periventricular and subcortical white matter change likely reflects small vessel ischemic microangiopathy. Small chronic lacunar infarcts are noted at the right basal ganglia.  The brainstem and fourth ventricle are within normal limits. The basal ganglia are unremarkable in appearance. The cerebral hemispheres demonstrate grossly normal gray-white differentiation. No mass effect or midline shift is seen.  There is no evidence of fracture; visualized osseous structures are unremarkable in appearance. The orbits are within normal limits. The paranasal sinuses and mastoid air cells are well-aerated. No significant soft tissue abnormalities are seen.  IMPRESSION: 1. No acute intracranial pathology seen on CT. 2. Moderate cortical volume loss and diffuse small vessel ischemic microangiopathy. 3. Likely small chronic lacunar infarcts at the right basal ganglia.   Electronically Signed   By: Roanna RaiderJeffery  Chang M.D.   On: 02/19/2015 21:13   Dg Chest Port 1 View  02/19/2015   CLINICAL DATA:  Patient with fever and rash. History of schizophrenia.  EXAM: PORTABLE CHEST - 1 VIEW  COMPARISON:  02/06/2015  FINDINGS: Monitoring leads overlie the patient. Stable enlarged cardiac and mediastinal contours. Pulmonary vascular redistribution. Low lung volumes. Bibasilar heterogeneous opacities. No definite pleural effusion or pneumothorax.  IMPRESSION: Low lung volumes with basilar atelectasis.  Cardiomegaly without definite evidence for acute pulmonary edema.   Electronically Signed   By: Annia Beltrew  Davis M.D.   On: 02/19/2015 17:16   Dg Fluoro Guide Ndl Plc/bx  02/21/2015   CLINICAL DATA:  Redness and swelling and pain of the right knee. Sepsis.  EXAM: RIGHT KNEE ASPIRATION  UNDER FLUOROSCOPY  FLUOROSCOPY TIME:  Fluoroscopy Time (in minutes and seconds): 0 minutes 13 seconds  Number of Acquired Images:  1 freeze frame  PROCEDURE: Overlying skin prepped with Betadine, draped in the usual sterile fashion, and infiltrated locally with buffered Lidocaine. 18 gauge needle was inserted into the suprapatellar recess of the right knee and 20 cc of pus was aspirated.  Mr. Araceli Boucherak tolerated the procedure well.  IMPRESSION: Technically successful right knee aspiration under fluoroscopy. The patient's physician was immediately informed of the results.   Electronically Signed   By: Francene BoyersJames  Maxwell M.D.   On: 02/21/2015 11:21     Assessment/Plan    ICD-9-CM ICD-10-CM   1. Gouty arthritis s/p right knee arthrocentesis and exploratory surgery 274.00 M10.09   2. SIRS (systemic inflammatory response syndrome) 995.90 A41.9   3. Paranoid schizophrenia - borderline controlled on meds 295.30 F20.0   4. Chronic kidney disease (CKD), stage IV (severe)  585.4 N18.4   5. Anemia, iron deficiency s/p 2 Units PRBCs 280.9 D50.9   6. Essential hypertension - controlled; cont meds 401.9 I10   7.       Diabetes mellitus II   --pain controlled with meds  --continue current medications as ordered  --PT/OT as indicated  --f/u with specialists as scheduled. Referral for rheum made already  --continue nutritional supplement BID for weight loss  --check A1c if not done already  --will follow. Prognosis unknown at this time.  --GOAL: short term rehab and d/c home when medically appropriate. Communicated with pt and nursing.   Leilah Polimeni S. Ancil Linseyarter, D. O., F. A. C. O. I.  [facility] [address] [place], [place] [address] [phone] Office (Wednesdays and Fridays 8 AM - 5 PM) [phone] Cell (Monday-Friday 8 AM - 5 PM)  This encounter was created in error - please disregard.

## 2015-03-16 ENCOUNTER — Encounter: Payer: Self-pay | Admitting: Internal Medicine

## 2015-04-05 DEATH — deceased

## 2015-04-06 DIAGNOSIS — E119 Type 2 diabetes mellitus without complications: Secondary | ICD-10-CM | POA: Insufficient documentation

## 2015-04-06 NOTE — Progress Notes (Signed)
Patient ID: [name] Wise, male   DOB: October 30, 1951, 64 y.o.   MRN: [id number]    HISTORY AND PHYSICAL  03/02/15  Location:    [facility]   Place of Service:   SNF  Extended Emergency Contact Information Primary Emergency Contact: Eulis Foster [place] Home Phone: [phone] Relation: Friend Secondary Emergency Contact: Jilda Roche [place] Home Phone: [phone] Relation: Daughter  Advanced Directive information  DNR/MOST FORM ON CHART  Chief Complaint  Patient presents with  . Readmit To SNF    gouty arthritis, SIRS, Fe deficiency anemia, acute encephalopathy, CKD, schizophrenia, DM II    HPI:  64 yo male seen today for readmission into SNF following hospital stay for right knee gouty arthritis. Hospital records reviewed. He is s/p arthrocentesis and exploratory surgery on 3/19th. 70 mls purulent fluid removed from knee by arthrocentesis by IR. He then went for an extensive procedure on March 19th where right knee was lavaged and extensive urate crystals noted. Crystals were embedded into mensicus and the articular cartilage. He also rec'd 2 units PRBCs dueto anemia. Blood cx x 1 revealed coag neg staph.  Today, he reports right knee pain controlled with meds. Appetite is ok. He is sleeping well. No recent falls. No nursing issues.  He continues to receive regular infusions of haldol for paranoid schizophrenia  CBGs reveal no low BS reactions. He takes oral meds and insulin.   Past Medical History  Diagnosis Date  . Gout   . Hypertension   . Schizophrenia   . Tobacco abuse   . Diabetes mellitus without complication     Past Surgical History  Procedure Laterality Date  . None    . Knee arthroscopy Right 02/21/2015    Procedure: ARTHROSCOPY KNEE;  Surgeon: Mckinley Jewel, MD;  Location: [facility] OR;  Service: Orthopedics;  Laterality: Right;    Patient Care Team: Ralene Ok, MD as PCP - General (Internal  Medicine)  History   Social History  . Marital Status: Divorced    Spouse Name: N/A  . Number of Children: N/A  . Years of Education: N/A   Occupational History  . Not on file.   Social History Main Topics  . Smoking status: Current Every Day Smoker    Types: Cigarettes  . Smokeless tobacco: Not on file  . Alcohol Use: No  . Drug Use: No  . Sexual Activity: Yes   Other Topics Concern  . Not on file   Social History Narrative     reports that he has been smoking Cigarettes.  He does not have any smokeless tobacco history on file. He reports that he does not drink alcohol or use illicit drugs.  Family History  Problem Relation Age of Onset  . Hypertension Mother   . Gout Father    No family status information on file.    Immunization History  Administered Date(s) Administered  . Pneumococcal Polysaccharide-23 01/15/2015    No Known Allergies  Medications: Patient's Medications  New Prescriptions   No medications on file  Previous Medications   ALLOPURINOL (ZYLOPRIM) 100 MG TABLET    Take 2 tablets (200 mg total) by mouth daily. Start on 02/16/15   ASPIRIN EC 325 MG EC TABLET    Take 1 tablet (325 mg total) by mouth daily.   BENZTROPINE (COGENTIN) 1 MG TABLET    Take 1 tablet by mouth daily.   FERROUS SULFATE 325 (65 FE) MG TABLET    Take 1 tablet (  325 mg total) by mouth 2 (two) times daily with a meal.   FOLIC ACID (FOLVITE) 1 MG TABLET    Take 1 tablet (1 mg total) by mouth daily.   INSULIN ASPART (NOVOLOG) 100 UNIT/ML INJECTION    Inject 0-9 Units into the skin 3 (three) times daily with meals.   INSULIN GLARGINE (LANTUS) 100 UNIT/ML INJECTION    Inject 10 Units into the skin at bedtime.   METOPROLOL TARTRATE (LOPRESSOR) 25 MG TABLET    Take 1 tablet (25 mg total) by mouth 2 (two) times daily.   OXYCODONE (OXY IR/ROXICODONE) 5 MG IMMEDIATE RELEASE TABLET    Take 2 tablets (10 mg total) by mouth every 4 (four) hours as needed for severe pain (Hopd for sedation,  confusion, or respiratory distress.).   PANTOPRAZOLE (PROTONIX) 40 MG TABLET    Take 1 tablet (40 mg total) by mouth daily.   PROBENECID (BENEMID) 500 MG TABLET    Take 500 mg by mouth 2 (two) times daily.   SITAGLIPTIN (JANUVIA) 25 MG TABLET    Take 1 tablet (25 mg total) by mouth daily.   SODIUM BICARBONATE 650 MG TABLET    Take 1,300 mg by mouth 2 (two) times daily.   VITAMIN B-12 500 MCG TABLET    Take 1 tablet (500 mcg total) by mouth daily.  Modified Medications   No medications on file  Discontinued Medications   No medications on file    Review of Systems  Unable to perform ROS: Psychiatric disorder    Filed Vitals:   03/02/15 1649  BP: 110/77  Pulse: 64  Temp: 97.3 F (36.3 C)  SpO2: 97%   There is no weight on file to calculate BMI.  Physical Exam  Constitutional: He appears well-developed. He appears ill.  Frail appearing in NAD   HENT:  Mouth/Throat: Oropharynx is clear and moist. Mucous membranes are dry.  Eyes: Pupils are equal, round, and reactive to light. No scleral icterus.  Neck: Neck supple. No thyromegaly present.  Cardiovascular: Normal rate, regular rhythm and intact distal pulses.  Exam reveals no gallop and no friction rub.   Murmur heard.  Systolic murmur is present with a grade of 1/6  No carotid bruit b/l; no distal LE swelling  Pulmonary/Chest: Effort normal and breath sounds normal. He has no wheezes. He has no rales. He exhibits no tenderness.  Abdominal: Soft. Bowel sounds are normal. He exhibits no distension, no abdominal bruit, no pulsatile midline mass and no mass. There is no tenderness. There is no rebound and no guarding.  Musculoskeletal: He exhibits edema and tenderness.       Right knee: He exhibits decreased range of motion (reduced flexion), swelling and erythema. He exhibits no effusion, no deformity, no bony tenderness and normal meniscus.       Legs: R>L knee swelling  Lymphadenopathy:    He has no cervical adenopathy.   Neurological: He is alert.  Skin: Skin is warm and dry. No rash noted.  Psychiatric: His behavior is normal. Judgment normal. His mood appears anxious. He is not agitated. Thought content is paranoid.     Labs reviewed: Admission on 02/19/2015, Discharged on 02/23/2015  No results displayed because visit has over 200 results.     CBC Latest Ref Rng 02/23/2015 02/22/2015 02/20/2015  WBC 4.0 - 10.5 K/uL 6.9 8.8 11.6(H)  Hemoglobin 13.0 - 17.0 g/dL 9.0(L) 9.6(L) 7.2(L)  Hematocrit 39.0 - 52.0 % 27.8(L) 29.7(L) 22.6(L)  Platelets 150 - 400 K/uL  181 180 235    CMP Latest Ref Rng 02/23/2015 02/22/2015 02/20/2015  Glucose 70 - 99 mg/dL 161(W) 960(A) 540(J)  BUN 6 - 23 mg/dL 81(X) 91(Y) 78(G)  Creatinine 0.50 - 1.35 mg/dL 9.56(O) 1.30(Q) 6.57(Q)  Sodium 135 - 145 mmol/L 136 135 135  Potassium 3.5 - 5.1 mmol/L 3.8 4.5 3.9  Chloride 96 - 112 mmol/L 104 105 104  CO2 19 - 32 mmol/L Calcium 8.4 - 10.5 mg/dL 8.1(L) 8.3(L) 7.9(L)  Total Protein 6.0 - 8.3 g/dL - 5.7(L) 5.6(L)  Total Bilirubin 0.3 - 1.2 mg/dL - 0.8 0.7  Alkaline Phos 39 - 117 U/L - 121(H) 101  AST 0 - 37 U/L - 23 28  ALT 0 - 53 U/L - 18 14      Ct Abdomen Pelvis Wo Contrast  02/19/2015   CLINICAL DATA:  Generalized abdominal pain.  EXAM: CT ABDOMEN AND PELVIS WITHOUT CONTRAST  TECHNIQUE: Multidetector CT imaging of the abdomen and pelvis was performed following the standard protocol without IV contrast.  COMPARISON:  CT scan of January 17, 2015.  FINDINGS: No significant abnormality seen in the visualized lung bases. No significant osseous abnormality is noted.  No gallstones are noted. No focal abnormality is noted in the liver, spleen or pancreas on these unenhanced images. Adrenal glands and kidneys appear normal. No hydronephrosis or renal obstruction is noted. No renal or ureteral calculi are noted. 3.1 cm infrarenal abdominal aortic aneurysm is noted. There is no evidence of bowel obstruction. No abnormal fluid  collection is noted. Urinary bladder appears normal. No significant adenopathy is noted. The appendix appears normal.  IMPRESSION: 3.1 cm infrarenal abdominal aortic aneurysm. Recommend followup by ultrasound in 3 years. This recommendation follows ACR consensus guidelines: White Paper of the ACR Incidental Findings Committee II on Vascular Findings. J Am Coll Radiol 2013; 10:789-794.  No other significant abnormality seen in the abdomen or pelvis.   Electronically Signed   By: Lupita Raider, M.D.   On: 02/19/2015 21:21   Dg Knee 1-2 Views Right  02/19/2015   CLINICAL DATA:  Acute onset of fever and sepsis. Generalized right knee pain and swelling. Initial encounter.  EXAM: RIGHT KNEE - 1-2 VIEW  COMPARISON:  Right knee radiographs performed 02/06/2015  FINDINGS: Minimal linear lucency at the superior pole of the patella may be artifactual in nature, though a fracture of the patella cannot be entirely excluded. Evaluation is suboptimal due to limitations in positioning.  The joint spaces are grossly preserved. No significant degenerative change is seen; the patellofemoral joint is grossly unremarkable in appearance. A fabella is noted.  A small to moderate knee joint effusion is noted. Scattered vascular calcifications are seen.  IMPRESSION: 1. Minimal linear lucency at the superior pole of the patella may be artifactual in nature, though a fracture of the patella cannot be entirely excluded. Would correlate for evidence of recent trauma. 2. Small to moderate knee joint effusion noted. 3. Scattered vascular calcifications seen.   Electronically Signed   By: Roanna Raider M.D.   On: 02/19/2015 20:41   Ct Head Wo Contrast  02/19/2015   CLINICAL DATA:  Acute onset of altered mental status. Encephalopathy. Initial encounter.  EXAM: CT HEAD WITHOUT CONTRAST  TECHNIQUE: Contiguous axial images were obtained from the base of the skull through the vertex without intravenous contrast.  COMPARISON:  MRI of the brain  and CT of the head performed 02/03/2015  FINDINGS: There is no evidence of acute  infarction, mass lesion, or intra- or extra-axial hemorrhage on CT.  Prominence of the ventricles and sulci reflects moderate cortical volume loss. Cerebellar atrophy is noted. Diffuse periventricular and subcortical white matter change likely reflects small vessel ischemic microangiopathy. Small chronic lacunar infarcts are noted at the right basal ganglia.  The brainstem and fourth ventricle are within normal limits. The basal ganglia are unremarkable in appearance. The cerebral hemispheres demonstrate grossly normal gray-white differentiation. No mass effect or midline shift is seen.  There is no evidence of fracture; visualized osseous structures are unremarkable in appearance. The orbits are within normal limits. The paranasal sinuses and mastoid air cells are well-aerated. No significant soft tissue abnormalities are seen.  IMPRESSION: 1. No acute intracranial pathology seen on CT. 2. Moderate cortical volume loss and diffuse small vessel ischemic microangiopathy. 3. Likely small chronic lacunar infarcts at the right basal ganglia.   Electronically Signed   By: Roanna Raider M.D.   On: 02/19/2015 21:13   Dg Chest Port 1 View  02/19/2015   CLINICAL DATA:  Patient with fever and rash. History of schizophrenia.  EXAM: PORTABLE CHEST - 1 VIEW  COMPARISON:  02/06/2015  FINDINGS: Monitoring leads overlie the patient. Stable enlarged cardiac and mediastinal contours. Pulmonary vascular redistribution. Low lung volumes. Bibasilar heterogeneous opacities. No definite pleural effusion or pneumothorax.  IMPRESSION: Low lung volumes with basilar atelectasis.  Cardiomegaly without definite evidence for acute pulmonary edema.   Electronically Signed   By: Annia Belt M.D.   On: 02/19/2015 17:16   Dg Fluoro Guide Ndl Plc/bx  02/21/2015   CLINICAL DATA:  Redness and swelling and pain of the right knee. Sepsis.  EXAM: RIGHT KNEE ASPIRATION  UNDER FLUOROSCOPY  FLUOROSCOPY TIME:  Fluoroscopy Time (in minutes and seconds): 0 minutes 13 seconds  Number of Acquired Images:  1 freeze frame  PROCEDURE: Overlying skin prepped with Betadine, draped in the usual sterile fashion, and infiltrated locally with buffered Lidocaine. 18 gauge needle was inserted into the suprapatellar recess of the right knee and 20 cc of pus was aspirated.  Mr. Errico tolerated the procedure well.  IMPRESSION: Technically successful right knee aspiration under fluoroscopy. The patient's physician was immediately informed of the results.   Electronically Signed   By: Francene Boyers M.D.   On: 02/21/2015 11:21     Assessment/Plan    ICD-9-CM ICD-10-CM   1. Gouty arthritis s/p right knee arthrocentesis and exploratory surgery 274.00 M10.09   2. SIRS (systemic inflammatory response syndrome) with (+) coag neg Staph blood cx 995.90 A41.9   3. Paranoid schizophrenia - borderline controlled on meds 295.30 F20.0   4. Chronic kidney disease (CKD), stage IV (severe)  585.4 N18.4   5. Anemia, iron deficiency s/p 2 Units PRBCs 280.9 D50.9   6. Essential hypertension - controlled; cont meds 401.9 I10   7.       Diabetes mellitus II with CKD   --pain controlled with meds  --continue current medications as ordered  --PT/OT as indicated  --f/u with specialists (including nephrology) as scheduled. Referral for rheum made already  --continue nutritional supplement BID for weight loss  --check A1c if not done already  --will follow. Prognosis unknown at this time.  --GOAL: short term rehab and d/c home when medically appropriate. Communicated with pt and nursing.   Edona Schreffler S. Ancil Linsey  [facility] [address] [place], [place] [address] [phone] Office (Wednesdays and Fridays 8 AM -  5 PM) [phone] Cell (Monday-Friday 8 AM - 5 PM)

## 2015-08-23 IMAGING — CR DG CHEST 1V PORT
1 series · 1 of 1 positions shown · non-contrast
Comparison: 02/03/2015

CLINICAL DATA: Fever and chest crackles

EXAM:
PORTABLE CHEST - 1 VIEW

[AP]
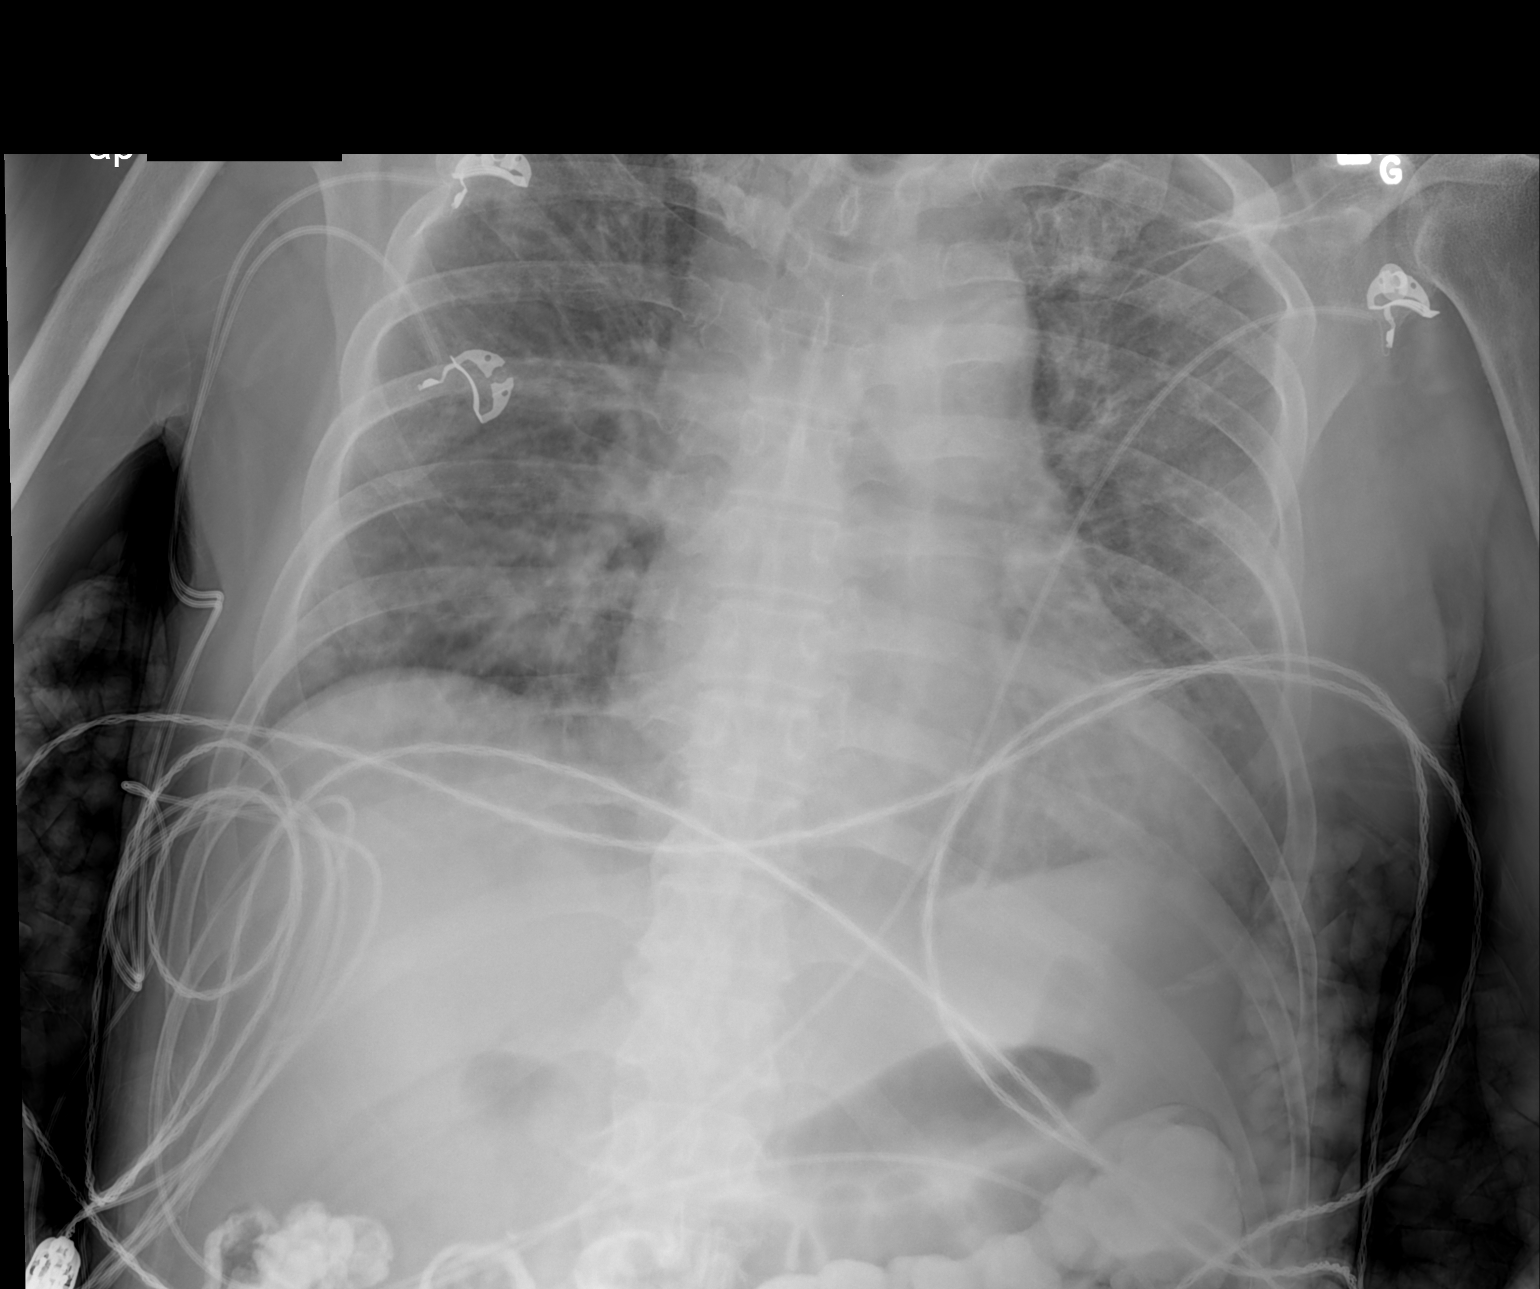

[1 of 1 positions shown; findings below may reference images not displayed]

FINDINGS: Chronic cardiomegaly. There is stable upper mediastinal widening.
There is new diffuse interstitial opacity, especially prominent at
the hila. No effusion or pneumothorax.
IMPRESSION: New pulmonary edema.

## 2015-08-24 IMAGING — DX DG KNEE 1-2V*R*
2 series · 2 of 2 positions shown · non-contrast
Comparison: 01/17/2015

CLINICAL DATA: Right knee pain, history of gout

EXAM:
RIGHT KNEE - 1-2 VIEW

[knee ap]
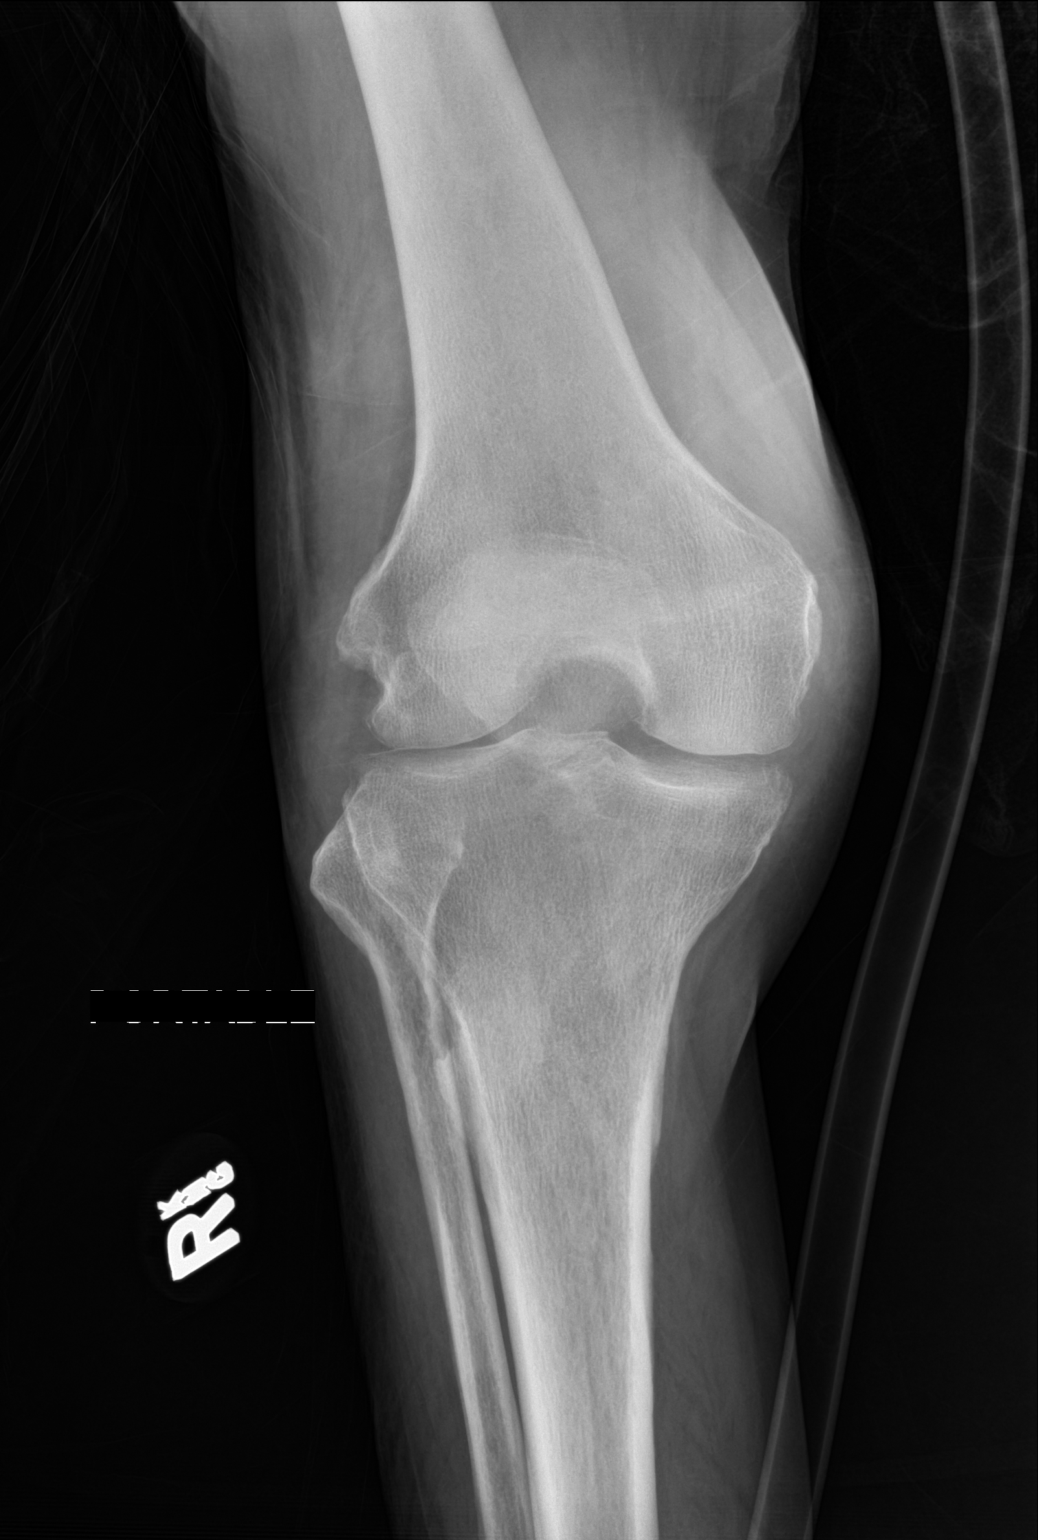

[knee lat]
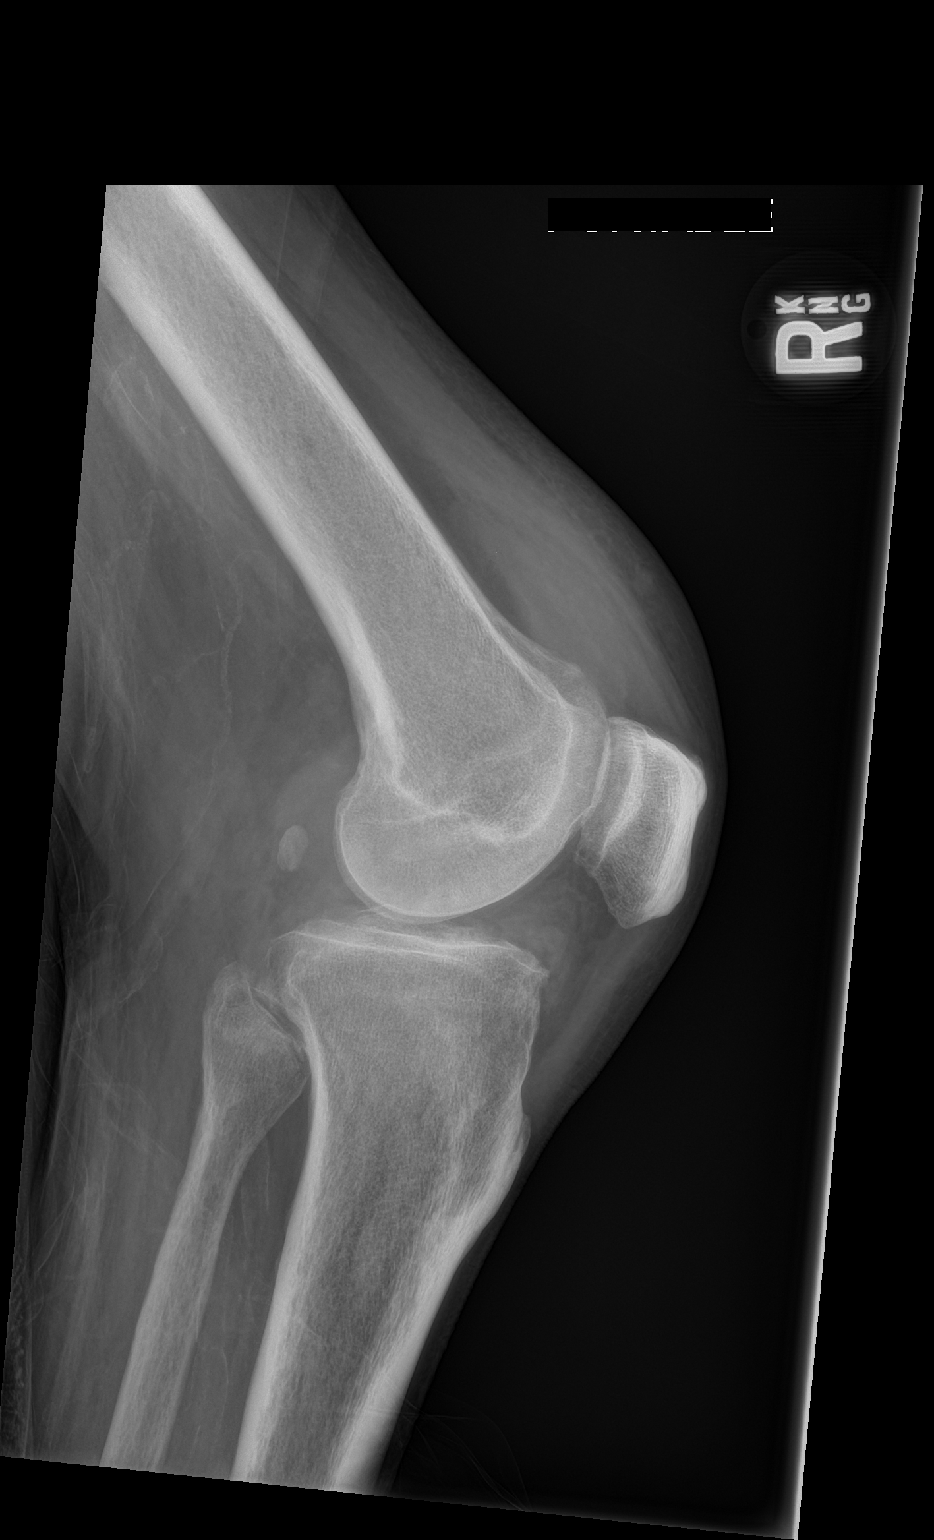

[2 of 2 positions shown; findings below may reference images not displayed]

FINDINGS: Small joint effusion is again identified. Some generalized soft
tissue swelling about the knee is seen. No acute fracture or
dislocation is noted. Mild degenerative changes are seen.
IMPRESSION: The overall appearance is stable from the prior exam. No acute bony
abnormality is seen.

## 2015-09-06 IMAGING — CT CT ABD-PELV W/O CM
1 of 2 series · 15 of 32 positions shown, 19 images · non-contrast
Comparison: CT scan of January 17, 2015.

CLINICAL DATA: Generalized abdominal pain.

EXAM:
CT ABDOMEN AND PELVIS WITHOUT CONTRAST
TECHNIQUE: Multidetector CT imaging of the abdomen and pelvis was performed
following the standard protocol without IV contrast.

[Series 2: abd/pel w/o · axial · non-contrast · 0.71mm/px · z∈[+936,+1396]mm · 15 of 101 slices shown, 19 images]
[im 5/101  soft-tissue]
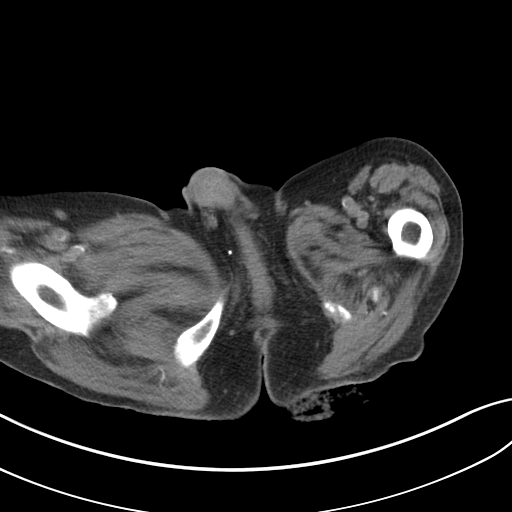
[im 5/101  bone]
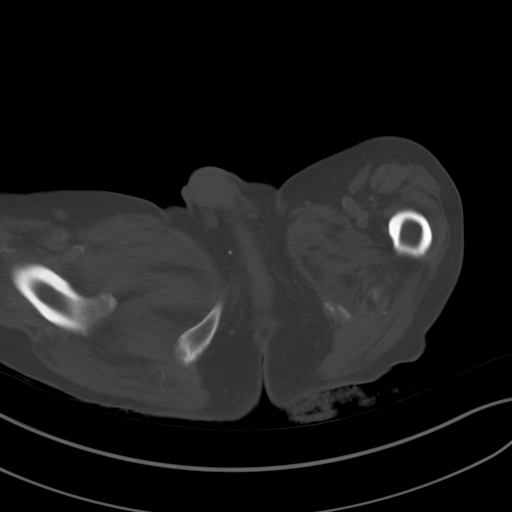
[im 13/101  soft-tissue]
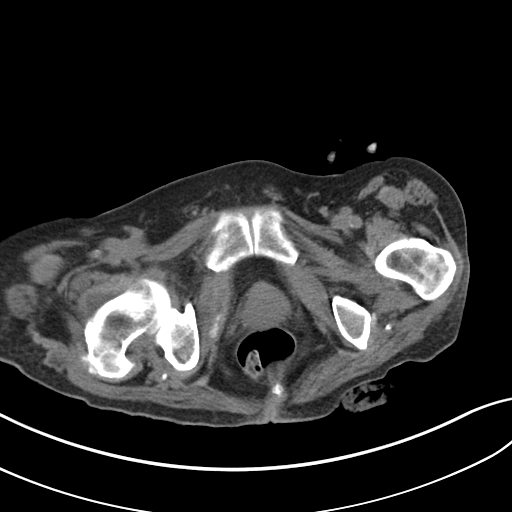
[im 21/101  soft-tissue]
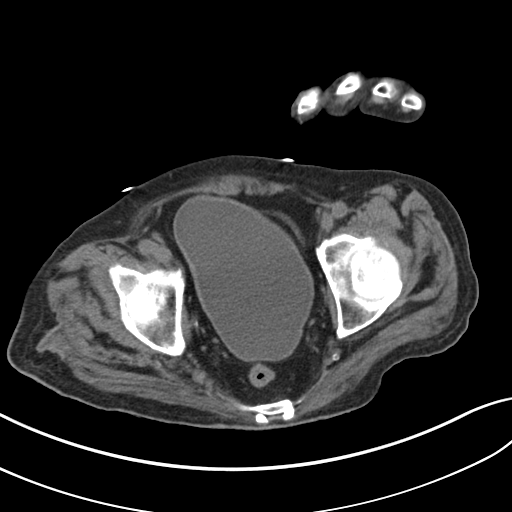
[im 29/101  soft-tissue]
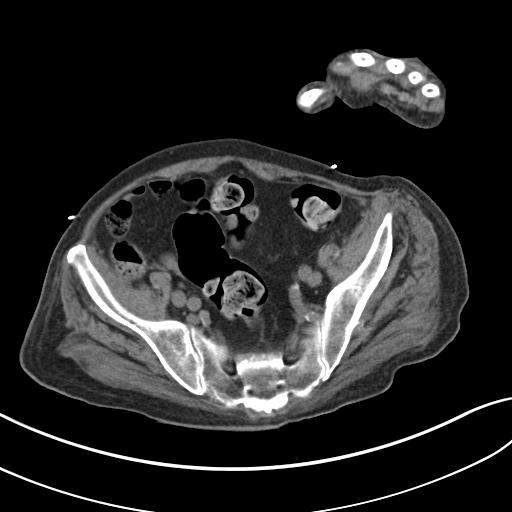
[im 37/101  soft-tissue]
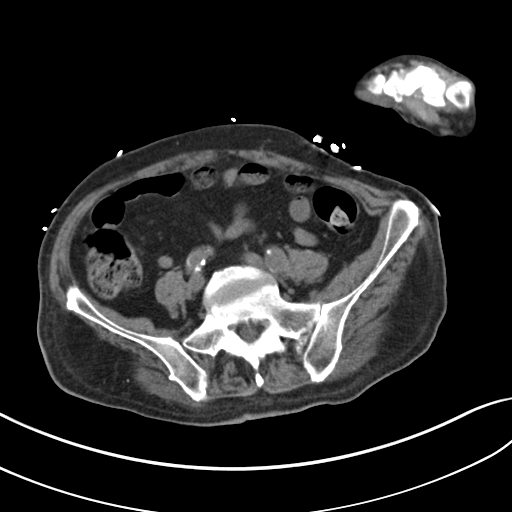
[im 45/101  soft-tissue]
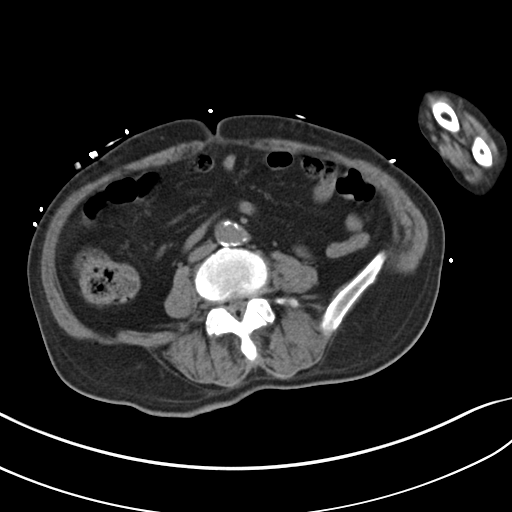
[im 53/101  soft-tissue]
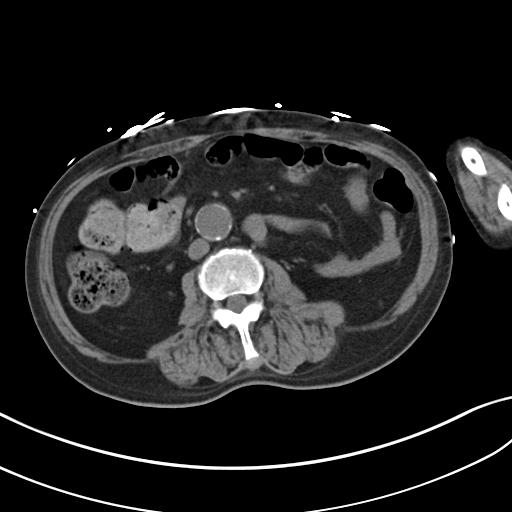
[im 57/101  soft-tissue]
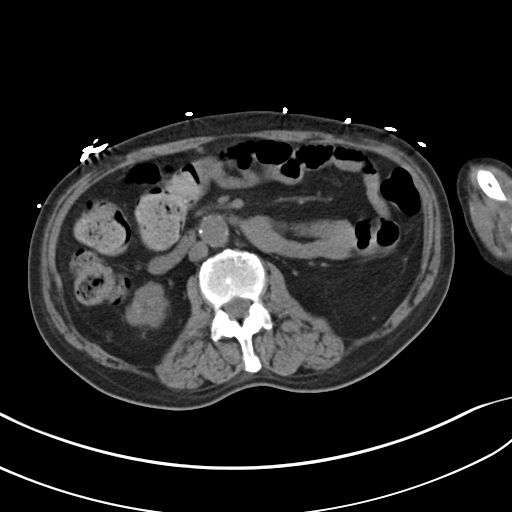
[im 65/101  soft-tissue]
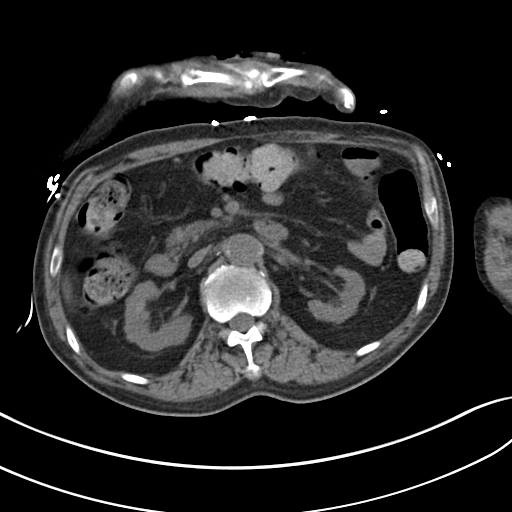
[im 65/101  bone]
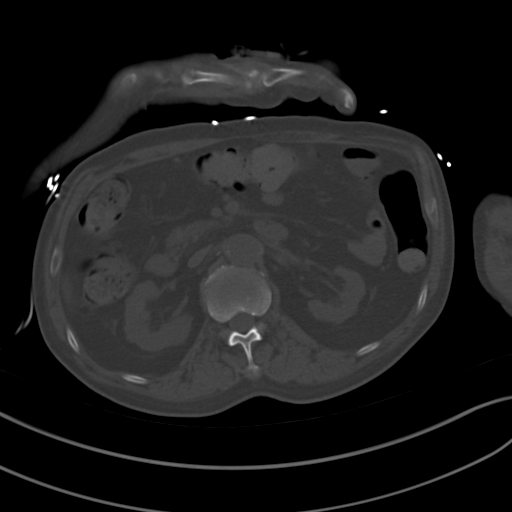
[im 73/101  soft-tissue]
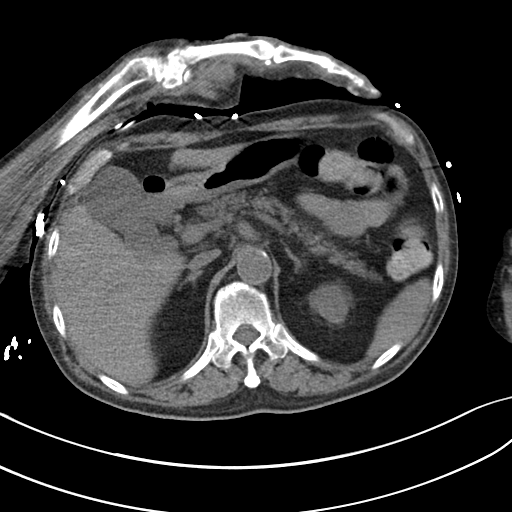
[im 81/101  soft-tissue]
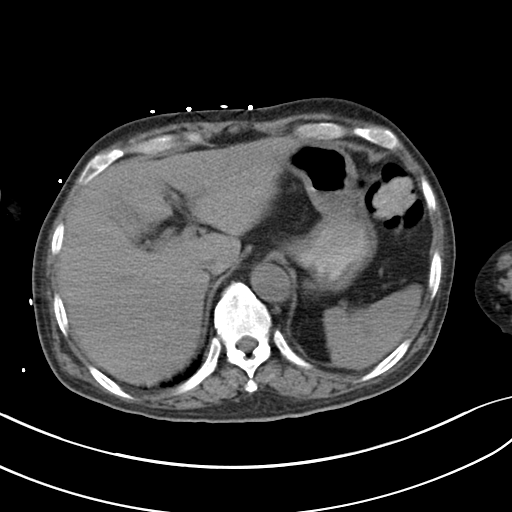
[im 85/101  lung]
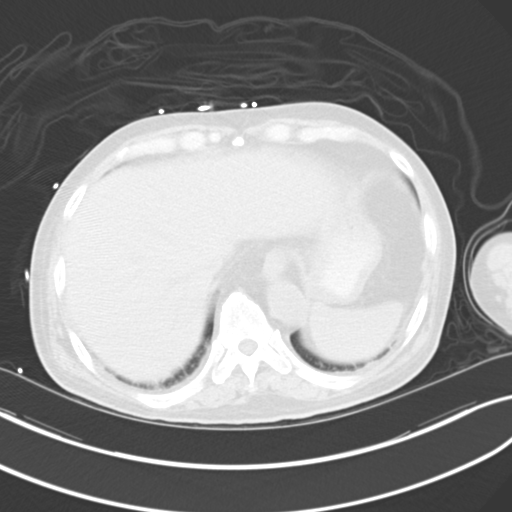
[im 89/101  soft-tissue]
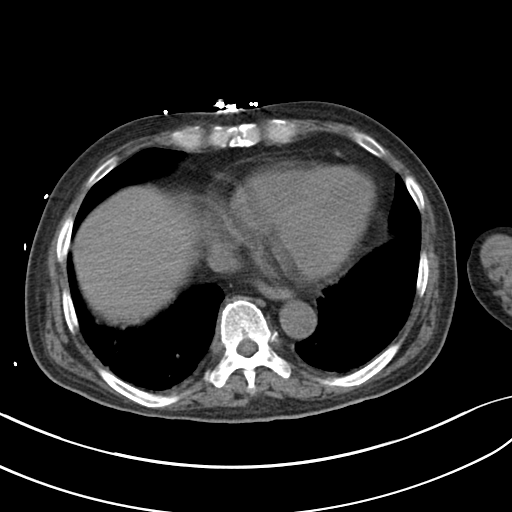
[im 89/101  lung]
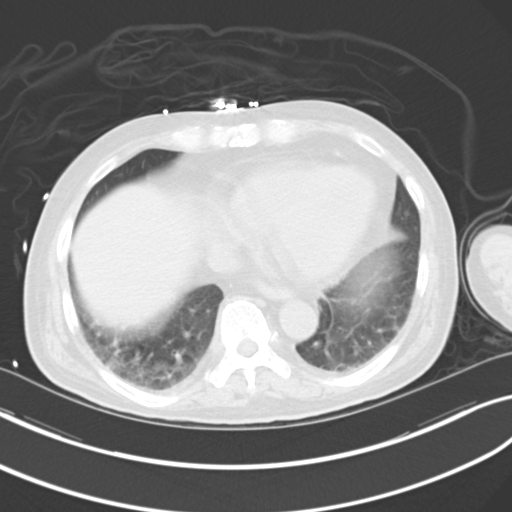
[im 93/101  lung]
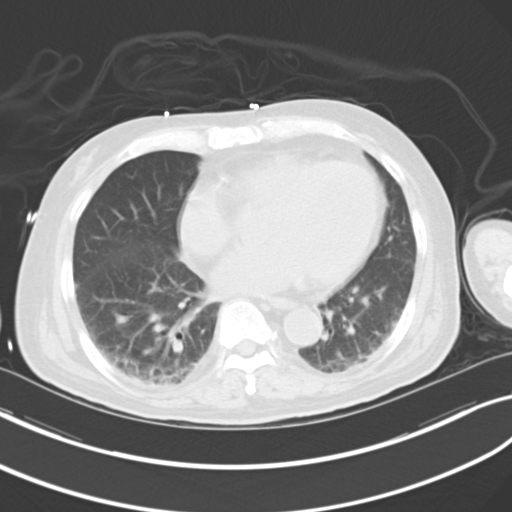
[im 97/101  soft-tissue]
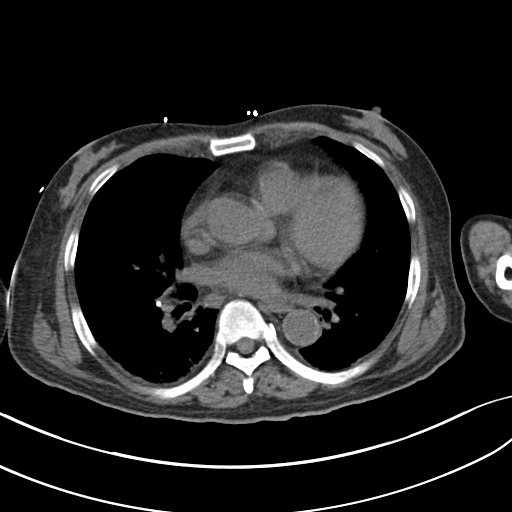
[im 97/101  lung]
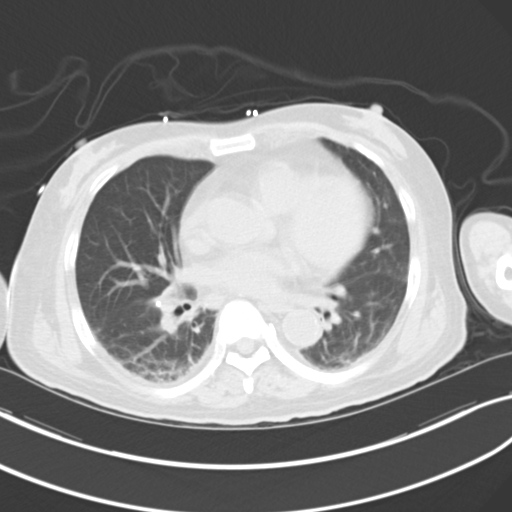

[15 of 32 positions shown; findings below may reference images not displayed]

FINDINGS: No significant abnormality seen in the visualized lung bases. No
significant osseous abnormality is noted.

No gallstones are noted. No focal abnormality is noted in the liver,
spleen or pancreas on these unenhanced images. Adrenal glands and
kidneys appear normal. No hydronephrosis or renal obstruction is
noted. No renal or ureteral calculi are noted. 3.1 cm infrarenal
abdominal aortic aneurysm is noted. There is no evidence of bowel
obstruction. No abnormal fluid collection is noted. Urinary bladder
appears normal. No significant adenopathy is noted. The appendix
appears normal.
IMPRESSION: 3.1 cm infrarenal abdominal aortic aneurysm. Recommend followup by
ultrasound in 3 years. This recommendation follows ACR consensus
guidelines: White Paper of the ACR Incidental Findings Committee II
on Vascular Findings. [HOSPITAL] 7772; [DATE].

No other significant abnormality seen in the abdomen or pelvis.
